# Patient Record
Sex: Male | Born: 1955 | Race: White | Hispanic: No | Marital: Married | Smoking: Never smoker
Health system: Southern US, Community
[De-identification: ages and names within clinical notes are randomized; demographics above are authoritative.]

## PROBLEM LIST (undated history)

## (undated) DIAGNOSIS — M1990 Unspecified osteoarthritis, unspecified site: Secondary | ICD-10-CM

## (undated) DIAGNOSIS — J984 Other disorders of lung: Secondary | ICD-10-CM

## (undated) DIAGNOSIS — L719 Rosacea, unspecified: Secondary | ICD-10-CM

## (undated) DIAGNOSIS — I483 Typical atrial flutter: Secondary | ICD-10-CM

## (undated) DIAGNOSIS — R002 Palpitations: Secondary | ICD-10-CM

## (undated) DIAGNOSIS — E782 Mixed hyperlipidemia: Secondary | ICD-10-CM

## (undated) DIAGNOSIS — R600 Localized edema: Secondary | ICD-10-CM

## (undated) DIAGNOSIS — K219 Gastro-esophageal reflux disease without esophagitis: Secondary | ICD-10-CM

## (undated) DIAGNOSIS — D6869 Other thrombophilia: Secondary | ICD-10-CM

## (undated) DIAGNOSIS — G4730 Sleep apnea, unspecified: Secondary | ICD-10-CM

## (undated) DIAGNOSIS — I4891 Unspecified atrial fibrillation: Secondary | ICD-10-CM

## (undated) DIAGNOSIS — R7303 Prediabetes: Secondary | ICD-10-CM

## (undated) DIAGNOSIS — I10 Essential (primary) hypertension: Secondary | ICD-10-CM

## (undated) DIAGNOSIS — I5022 Chronic systolic (congestive) heart failure: Secondary | ICD-10-CM

## (undated) DIAGNOSIS — Z9889 Other specified postprocedural states: Secondary | ICD-10-CM

## (undated) DIAGNOSIS — S83207A Unspecified tear of unspecified meniscus, current injury, left knee, initial encounter: Secondary | ICD-10-CM

## (undated) DIAGNOSIS — R0602 Shortness of breath: Secondary | ICD-10-CM

## (undated) DIAGNOSIS — T7840XA Allergy, unspecified, initial encounter: Secondary | ICD-10-CM

## (undated) DIAGNOSIS — M2550 Pain in unspecified joint: Secondary | ICD-10-CM

## (undated) DIAGNOSIS — D649 Anemia, unspecified: Secondary | ICD-10-CM

## (undated) DIAGNOSIS — R079 Chest pain, unspecified: Secondary | ICD-10-CM

## (undated) HISTORY — DX: Pain in unspecified joint: M25.50

## (undated) HISTORY — DX: Sleep apnea, unspecified: G47.30

## (undated) HISTORY — DX: Palpitations: R00.2

## (undated) HISTORY — PX: UPPER GASTROINTESTINAL ENDOSCOPY: SHX188

## (undated) HISTORY — DX: Chest pain, unspecified: R07.9

## (undated) HISTORY — DX: Rosacea, unspecified: L71.9

## (undated) HISTORY — DX: Unspecified atrial fibrillation: I48.91

## (undated) HISTORY — DX: Unspecified tear of unspecified meniscus, current injury, left knee, initial encounter: S83.207A

## (undated) HISTORY — DX: Anemia, unspecified: D64.9

## (undated) HISTORY — PX: COLONOSCOPY: SHX174

## (undated) HISTORY — DX: Essential (primary) hypertension: I10

## (undated) HISTORY — DX: Chronic systolic (congestive) heart failure: I50.22

## (undated) HISTORY — DX: Shortness of breath: R06.02

## (undated) HISTORY — DX: Unspecified osteoarthritis, unspecified site: M19.90

## (undated) HISTORY — DX: Prediabetes: R73.03

## (undated) HISTORY — DX: Typical atrial flutter: I48.3

## (undated) HISTORY — DX: Gastro-esophageal reflux disease without esophagitis: K21.9

## (undated) HISTORY — DX: Other thrombophilia: D68.69

## (undated) HISTORY — DX: Allergy, unspecified, initial encounter: T78.40XA

## (undated) HISTORY — DX: Localized edema: R60.0

## (undated) HISTORY — DX: Other specified postprocedural states: Z98.890

## (undated) HISTORY — DX: Mixed hyperlipidemia: E78.2

---

## 1999-02-22 ENCOUNTER — Ambulatory Visit: Admission: RE | Admit: 1999-02-22 | Discharge: 1999-02-22 | Payer: Self-pay | Admitting: Otolaryngology

## 1999-04-23 ENCOUNTER — Ambulatory Visit: Admission: RE | Admit: 1999-04-23 | Discharge: 1999-04-23 | Payer: Self-pay | Admitting: Otolaryngology

## 2002-06-20 ENCOUNTER — Encounter: Payer: Self-pay | Admitting: Gastroenterology

## 2002-06-20 ENCOUNTER — Ambulatory Visit (HOSPITAL_COMMUNITY): Admission: RE | Admit: 2002-06-20 | Discharge: 2002-06-20 | Payer: Self-pay | Admitting: Gastroenterology

## 2006-02-03 ENCOUNTER — Ambulatory Visit: Payer: Self-pay | Admitting: Family Medicine

## 2006-04-17 ENCOUNTER — Ambulatory Visit: Payer: Self-pay | Admitting: Family Medicine

## 2006-04-24 ENCOUNTER — Ambulatory Visit: Payer: Self-pay | Admitting: Family Medicine

## 2007-01-18 ENCOUNTER — Ambulatory Visit: Payer: Self-pay | Admitting: Family Medicine

## 2008-04-09 DIAGNOSIS — L03039 Cellulitis of unspecified toe: Secondary | ICD-10-CM

## 2008-04-09 DIAGNOSIS — L02619 Cutaneous abscess of unspecified foot: Secondary | ICD-10-CM | POA: Insufficient documentation

## 2008-04-11 ENCOUNTER — Ambulatory Visit: Payer: Self-pay | Admitting: Family Medicine

## 2008-04-14 ENCOUNTER — Ambulatory Visit: Payer: Self-pay | Admitting: Family Medicine

## 2008-07-22 ENCOUNTER — Telehealth: Payer: Self-pay | Admitting: Family Medicine

## 2008-12-12 DIAGNOSIS — J984 Other disorders of lung: Secondary | ICD-10-CM

## 2008-12-12 DIAGNOSIS — R0989 Other specified symptoms and signs involving the circulatory and respiratory systems: Secondary | ICD-10-CM

## 2008-12-12 DIAGNOSIS — R0609 Other forms of dyspnea: Secondary | ICD-10-CM

## 2008-12-12 HISTORY — DX: Other disorders of lung: J98.4

## 2009-01-02 ENCOUNTER — Ambulatory Visit: Payer: Self-pay | Admitting: Family Medicine

## 2009-01-02 ENCOUNTER — Telehealth: Payer: Self-pay | Admitting: Family Medicine

## 2009-01-08 ENCOUNTER — Ambulatory Visit: Payer: Self-pay | Admitting: Internal Medicine

## 2009-01-15 ENCOUNTER — Telehealth (INDEPENDENT_AMBULATORY_CARE_PROVIDER_SITE_OTHER): Payer: Self-pay | Admitting: *Deleted

## 2009-01-15 DIAGNOSIS — R0602 Shortness of breath: Secondary | ICD-10-CM

## 2009-01-29 ENCOUNTER — Ambulatory Visit: Payer: Self-pay | Admitting: Pulmonary Disease

## 2009-01-29 ENCOUNTER — Ambulatory Visit: Payer: Self-pay | Admitting: Internal Medicine

## 2009-01-29 DIAGNOSIS — R0902 Hypoxemia: Secondary | ICD-10-CM

## 2009-01-29 DIAGNOSIS — J309 Allergic rhinitis, unspecified: Secondary | ICD-10-CM | POA: Insufficient documentation

## 2009-01-29 DIAGNOSIS — G4730 Sleep apnea, unspecified: Secondary | ICD-10-CM

## 2009-01-30 ENCOUNTER — Telehealth: Payer: Self-pay | Admitting: Pulmonary Disease

## 2009-02-02 ENCOUNTER — Telehealth (INDEPENDENT_AMBULATORY_CARE_PROVIDER_SITE_OTHER): Payer: Self-pay | Admitting: *Deleted

## 2009-02-02 ENCOUNTER — Ambulatory Visit: Payer: Self-pay | Admitting: Pulmonary Disease

## 2009-02-02 LAB — CONVERTED CEMR LAB
Eosinophils Relative: 4.1 % (ref 0.0–5.0)
HCT: 51.4 % (ref 39.0–52.0)
INR: 1.1 — ABNORMAL HIGH (ref 0.8–1.0)
Lymphocytes Relative: 20.8 % (ref 12.0–46.0)
Monocytes Relative: 10.7 % (ref 3.0–12.0)
Neutrophils Relative %: 64.3 % (ref 43.0–77.0)
Platelets: 292 10*3/uL (ref 150–400)
WBC: 8.1 10*3/uL (ref 4.5–10.5)

## 2009-02-04 ENCOUNTER — Ambulatory Visit: Payer: Self-pay | Admitting: Emergency Medicine

## 2009-02-04 ENCOUNTER — Ambulatory Visit: Admission: RE | Admit: 2009-02-04 | Discharge: 2009-02-04 | Payer: Self-pay | Admitting: Emergency Medicine

## 2009-02-04 ENCOUNTER — Encounter: Payer: Self-pay | Admitting: Emergency Medicine

## 2009-02-06 ENCOUNTER — Telehealth: Payer: Self-pay | Admitting: Internal Medicine

## 2009-02-09 ENCOUNTER — Inpatient Hospital Stay (HOSPITAL_COMMUNITY): Admission: EM | Admit: 2009-02-09 | Discharge: 2009-02-16 | Payer: Self-pay | Admitting: Emergency Medicine

## 2009-02-09 ENCOUNTER — Ambulatory Visit: Payer: Self-pay | Admitting: Pulmonary Disease

## 2009-02-09 ENCOUNTER — Ambulatory Visit: Payer: Self-pay | Admitting: Thoracic Surgery

## 2009-02-10 ENCOUNTER — Encounter: Payer: Self-pay | Admitting: Pulmonary Disease

## 2009-02-10 ENCOUNTER — Encounter: Payer: Self-pay | Admitting: Thoracic Surgery

## 2009-02-24 ENCOUNTER — Ambulatory Visit: Payer: Self-pay | Admitting: Thoracic Surgery

## 2009-02-24 ENCOUNTER — Encounter: Admission: RE | Admit: 2009-02-24 | Discharge: 2009-02-24 | Payer: Self-pay | Admitting: Thoracic Surgery

## 2009-02-25 ENCOUNTER — Telehealth (INDEPENDENT_AMBULATORY_CARE_PROVIDER_SITE_OTHER): Payer: Self-pay | Admitting: *Deleted

## 2009-03-02 ENCOUNTER — Ambulatory Visit: Payer: Self-pay | Admitting: Pulmonary Disease

## 2009-03-02 DIAGNOSIS — J679 Hypersensitivity pneumonitis due to unspecified organic dust: Secondary | ICD-10-CM | POA: Insufficient documentation

## 2009-03-12 ENCOUNTER — Encounter: Payer: Self-pay | Admitting: Pulmonary Disease

## 2009-03-12 ENCOUNTER — Telehealth (INDEPENDENT_AMBULATORY_CARE_PROVIDER_SITE_OTHER): Payer: Self-pay | Admitting: *Deleted

## 2009-03-17 ENCOUNTER — Ambulatory Visit: Payer: Self-pay | Admitting: Thoracic Surgery

## 2009-03-17 ENCOUNTER — Encounter: Admission: RE | Admit: 2009-03-17 | Discharge: 2009-03-17 | Payer: Self-pay | Admitting: Thoracic Surgery

## 2009-03-30 ENCOUNTER — Ambulatory Visit: Payer: Self-pay | Admitting: Pulmonary Disease

## 2009-04-11 HISTORY — PX: LUNG BIOPSY: SHX232

## 2009-04-27 ENCOUNTER — Ambulatory Visit: Payer: Self-pay | Admitting: Pulmonary Disease

## 2009-05-20 ENCOUNTER — Encounter: Admission: RE | Admit: 2009-05-20 | Discharge: 2009-05-20 | Payer: Self-pay | Admitting: Thoracic Surgery

## 2009-05-20 ENCOUNTER — Ambulatory Visit: Payer: Self-pay | Admitting: Thoracic Surgery

## 2009-06-09 ENCOUNTER — Encounter (INDEPENDENT_AMBULATORY_CARE_PROVIDER_SITE_OTHER): Payer: Self-pay | Admitting: *Deleted

## 2009-07-08 ENCOUNTER — Ambulatory Visit: Payer: Self-pay | Admitting: Pulmonary Disease

## 2009-07-08 ENCOUNTER — Encounter: Payer: Self-pay | Admitting: Pulmonary Disease

## 2009-09-04 ENCOUNTER — Ambulatory Visit: Payer: Self-pay | Admitting: Pulmonary Disease

## 2009-09-10 ENCOUNTER — Telehealth (INDEPENDENT_AMBULATORY_CARE_PROVIDER_SITE_OTHER): Payer: Self-pay | Admitting: *Deleted

## 2009-09-10 ENCOUNTER — Ambulatory Visit: Payer: Self-pay | Admitting: Pulmonary Disease

## 2009-09-10 LAB — CONVERTED CEMR LAB: IgE (Immunoglobulin E), Serum: <1.5 intl units/mL (ref 0.0–180.0)

## 2009-09-15 ENCOUNTER — Telehealth: Payer: Self-pay | Admitting: Pulmonary Disease

## 2009-09-24 ENCOUNTER — Ambulatory Visit: Payer: Self-pay | Admitting: Pulmonary Disease

## 2009-10-23 ENCOUNTER — Ambulatory Visit: Payer: Self-pay | Admitting: Internal Medicine

## 2009-11-09 ENCOUNTER — Ambulatory Visit: Payer: Self-pay | Admitting: Pulmonary Disease

## 2009-12-15 ENCOUNTER — Telehealth: Payer: Self-pay | Admitting: Pulmonary Disease

## 2010-01-08 ENCOUNTER — Ambulatory Visit: Payer: Self-pay | Admitting: Pulmonary Disease

## 2010-03-14 IMAGING — CR DG CHEST 1V PORT
1 series · 1 of 1 positions shown · non-contrast
Comparison: Chest CT dated 01/29/2009 and chest x-ray dated
01/02/2009

CLINICAL DATA: Infiltrates bilaterally on chest CT.  Status post
bronchoscopy.

PORTABLE CHEST - 1 VIEW

[view not recorded]
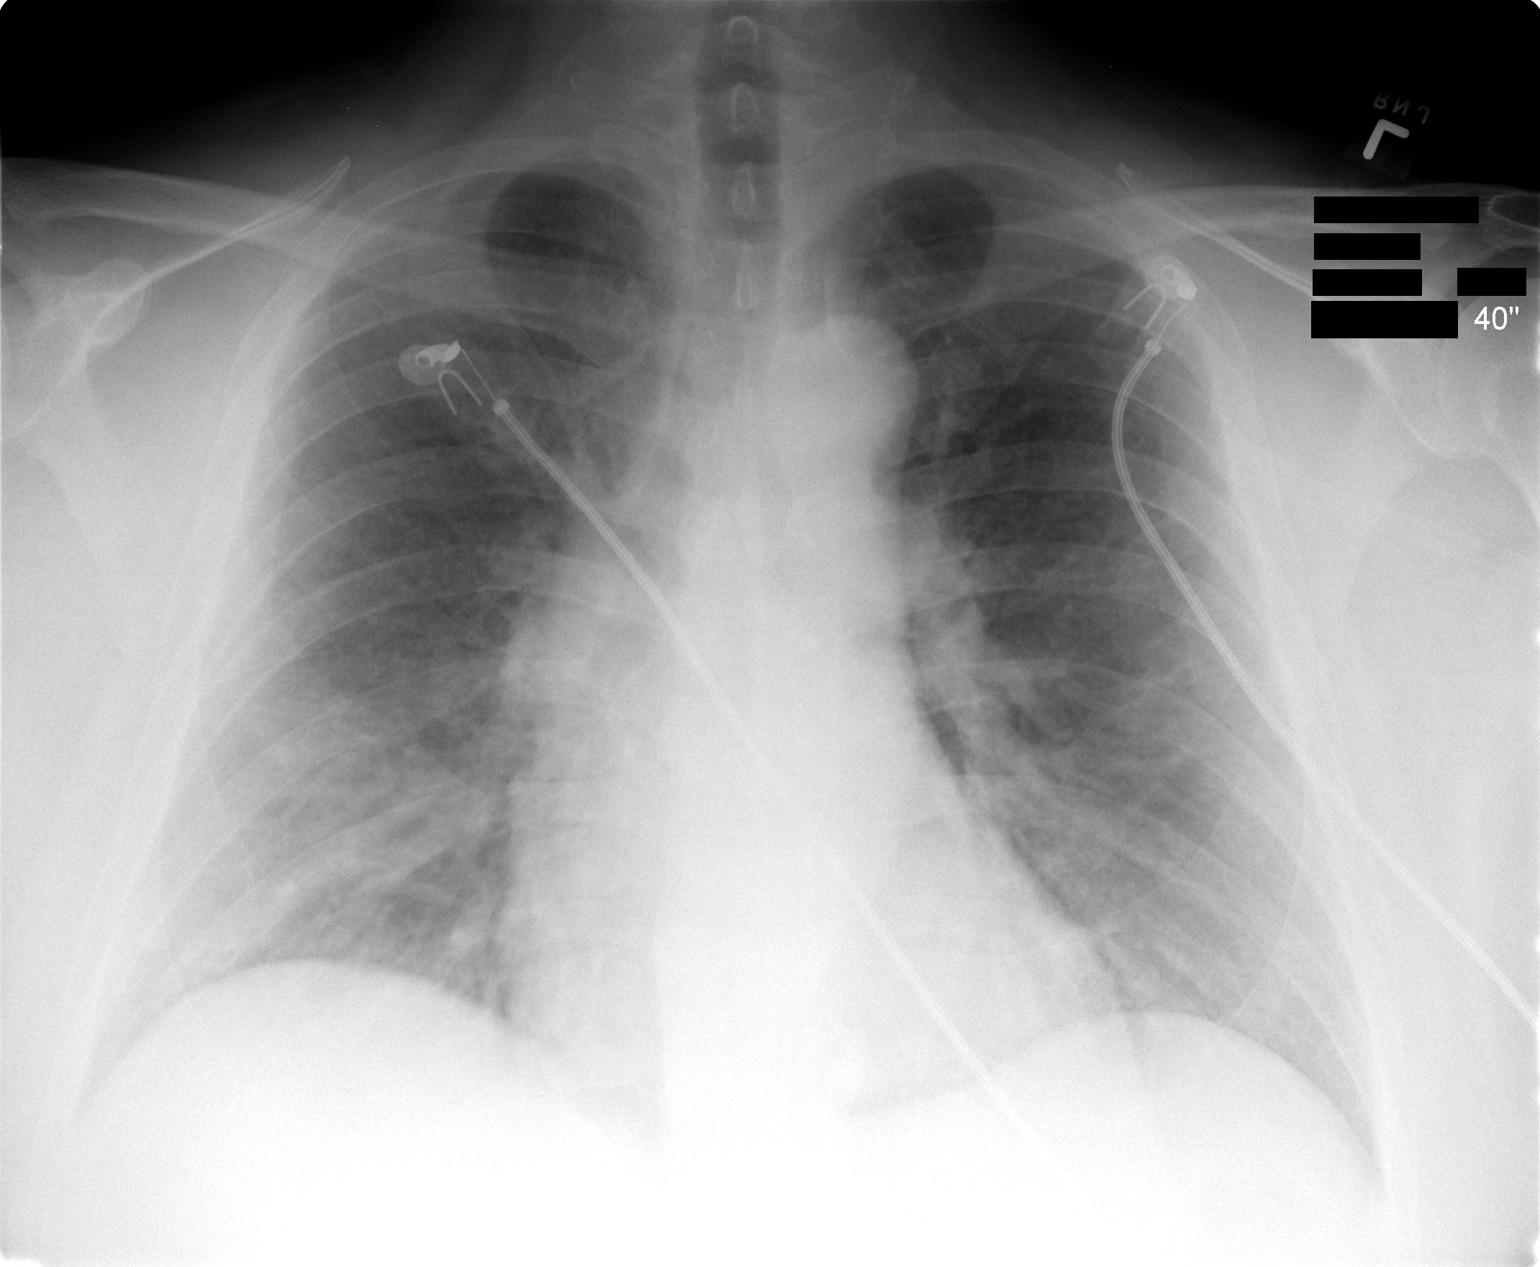

[1 of 1 positions shown; findings below may reference images not displayed]

FINDINGS: There is no pneumothorax.  There is minimal haziness in
the right lower lung zone which probably represents a small
residual area of infiltrate.  The left lung is clear.  Heart and
mediastinal structures are normal.
IMPRESSION: Small area of density at the right lung base which probably
represents a small residual area of infiltrate.  No pneumothorax.

## 2010-03-18 IMAGING — CR DG CHEST 2V
2 series · 2 of 2 positions shown · non-contrast
Comparison: 02/04/2009

CLINICAL DATA: Fever and congestion.

CHEST - 2 VIEW

[w chest pa]
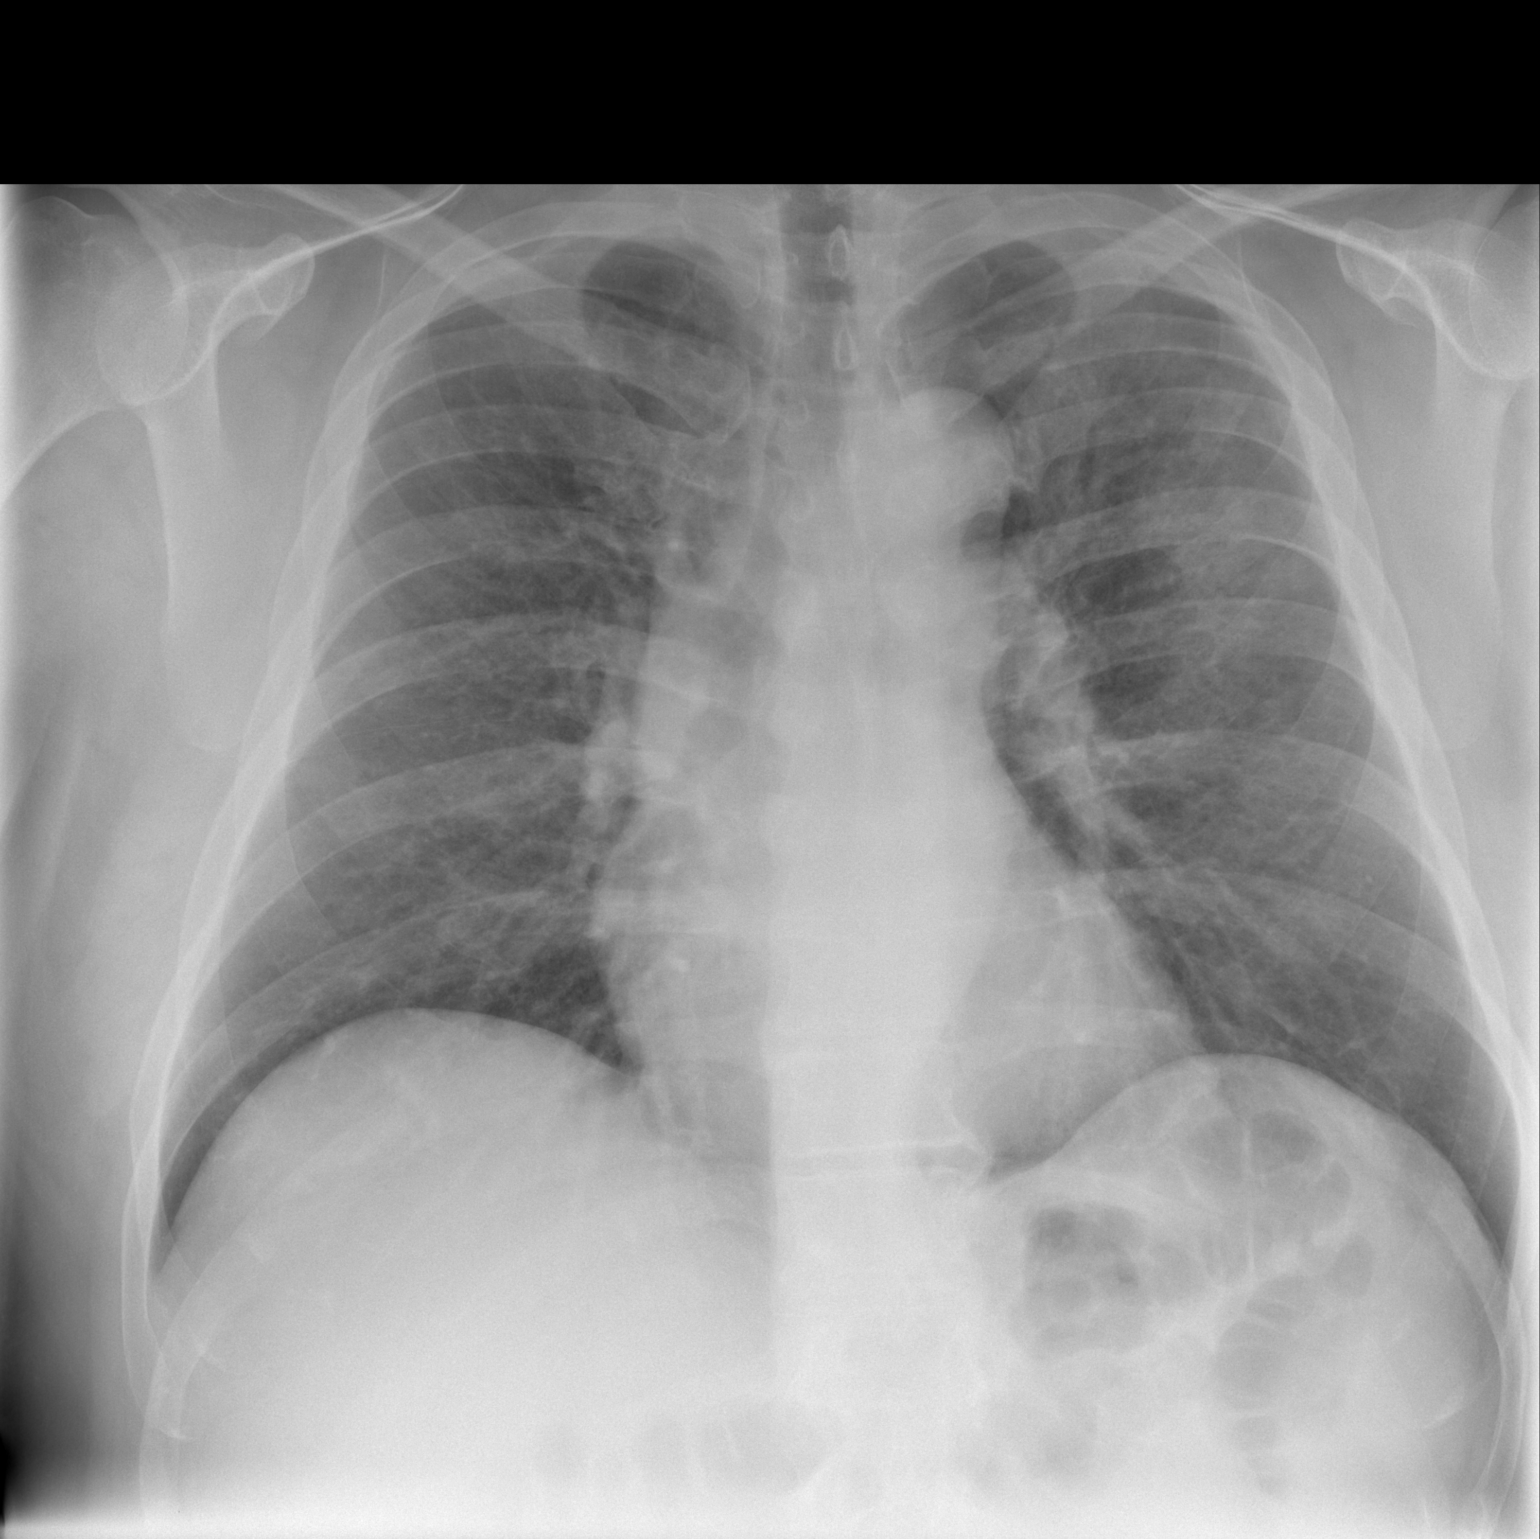

[w chest lat]
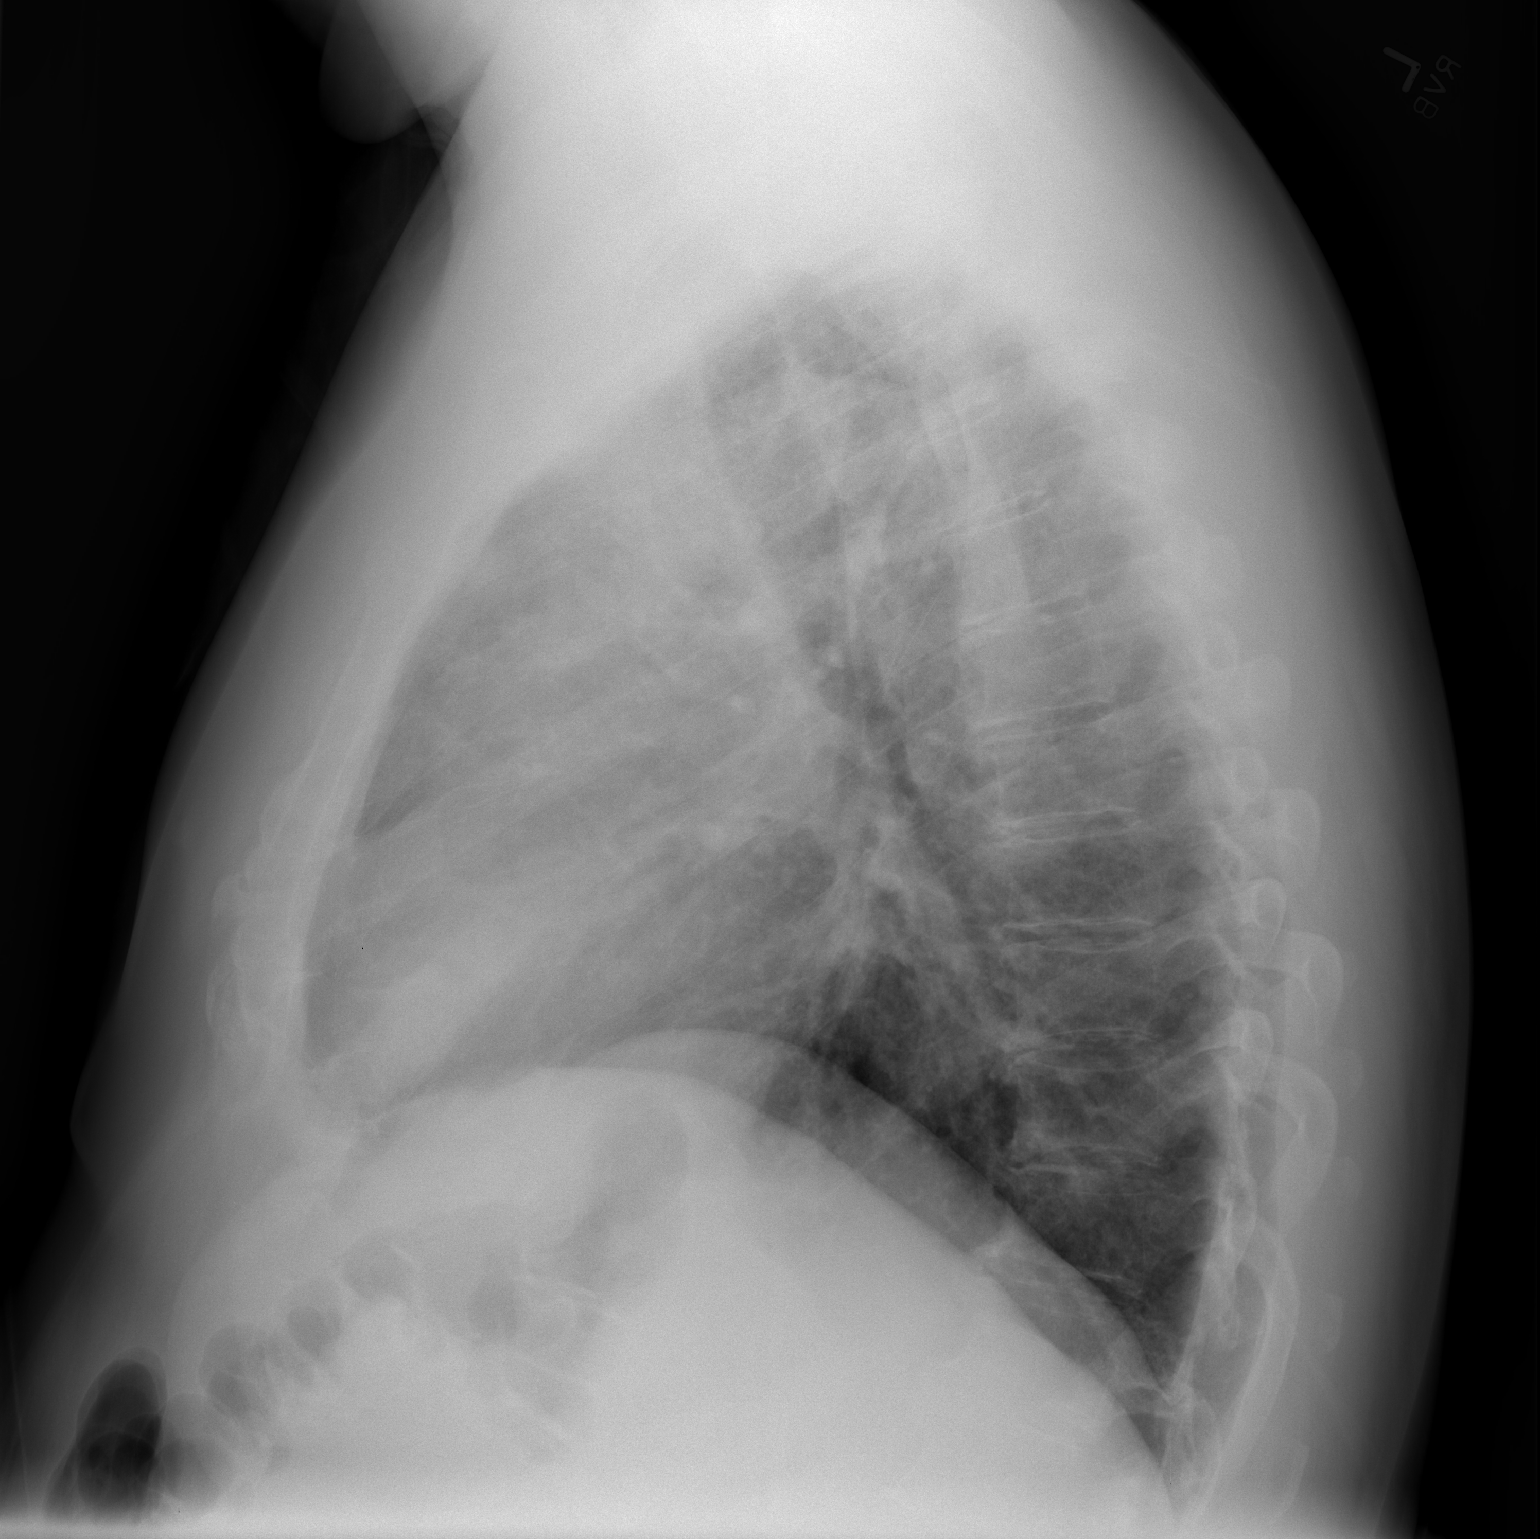

[2 of 2 positions shown; findings below may reference images not displayed]

FINDINGS: The cardiac silhouette, mediastinal and hilar contours
are stable.  Progressive interstitial and airspace process may
reflect edema or infection.  No focal airspace consolidation or
pleural effusion.
IMPRESSION: 1.  Progressive interstitial and airspace process.  This may
reflect an acute inflammatory or infectious alveolitis or edema.

## 2010-03-20 IMAGING — CR DG CHEST 1V PORT
1 series · 1 of 1 positions shown · non-contrast
Comparison: Plain film 02/08/2009.

CLINICAL DATA: Hypoxia.  Interstitial lung disease.

PORTABLE CHEST - 1 VIEW

[view not recorded]
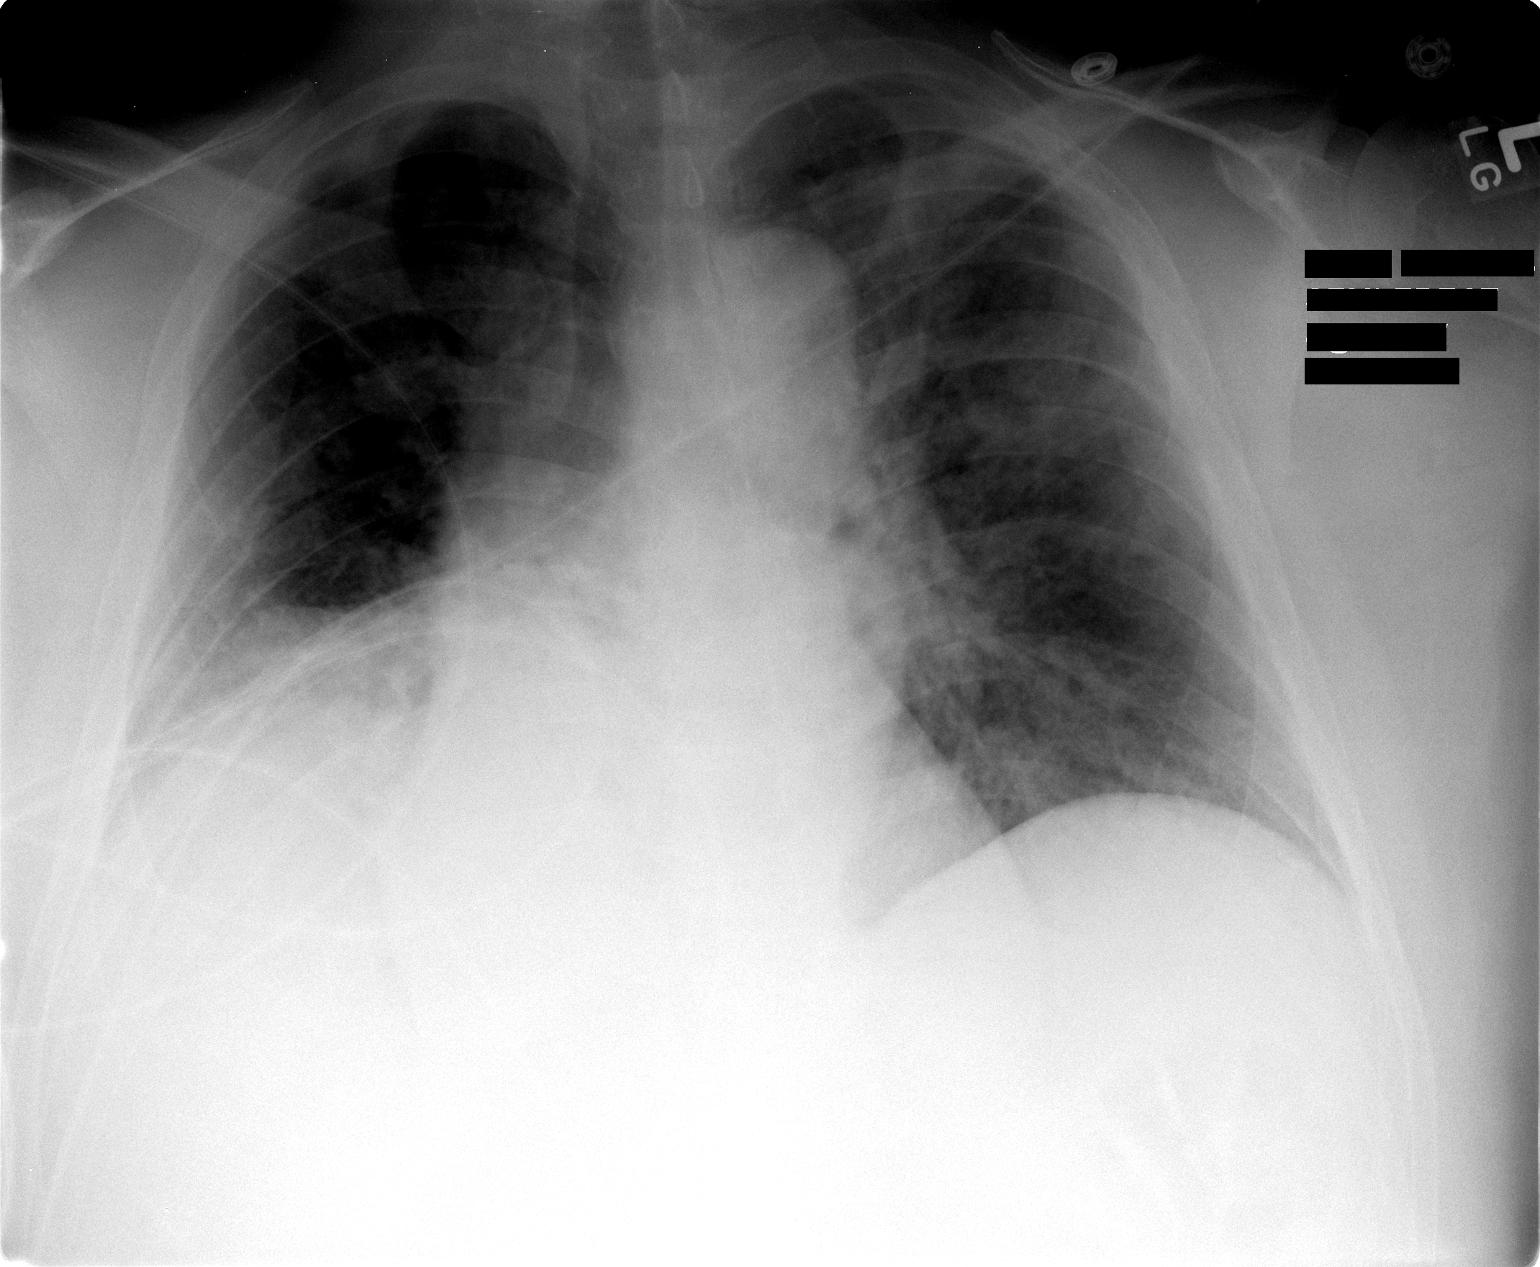

[1 of 1 positions shown; findings below may reference images not displayed]

FINDINGS: There has been marked worsening in aeration, particularly
the right lung base where there is now dense airspace opacity and
likely right effusion.  There has also been progression of airspace
disease throughout the left chest.  Lung volumes are lower than on
the prior study.  Heart size normal.
IMPRESSION: Marked worsening in bilateral airspace disease particularly the
right lung base.  Findings are worrisome for progressive pneumonia
although edema with atelectasis and effusion in the right base
could create similar appearance.

## 2010-03-21 IMAGING — CR DG CHEST 1V PORT
1 series · 1 of 1 positions shown · non-contrast
Comparison: 02/10/2009

CLINICAL DATA: Lung lesion removal.

PORTABLE CHEST - 1 VIEW

[AP]
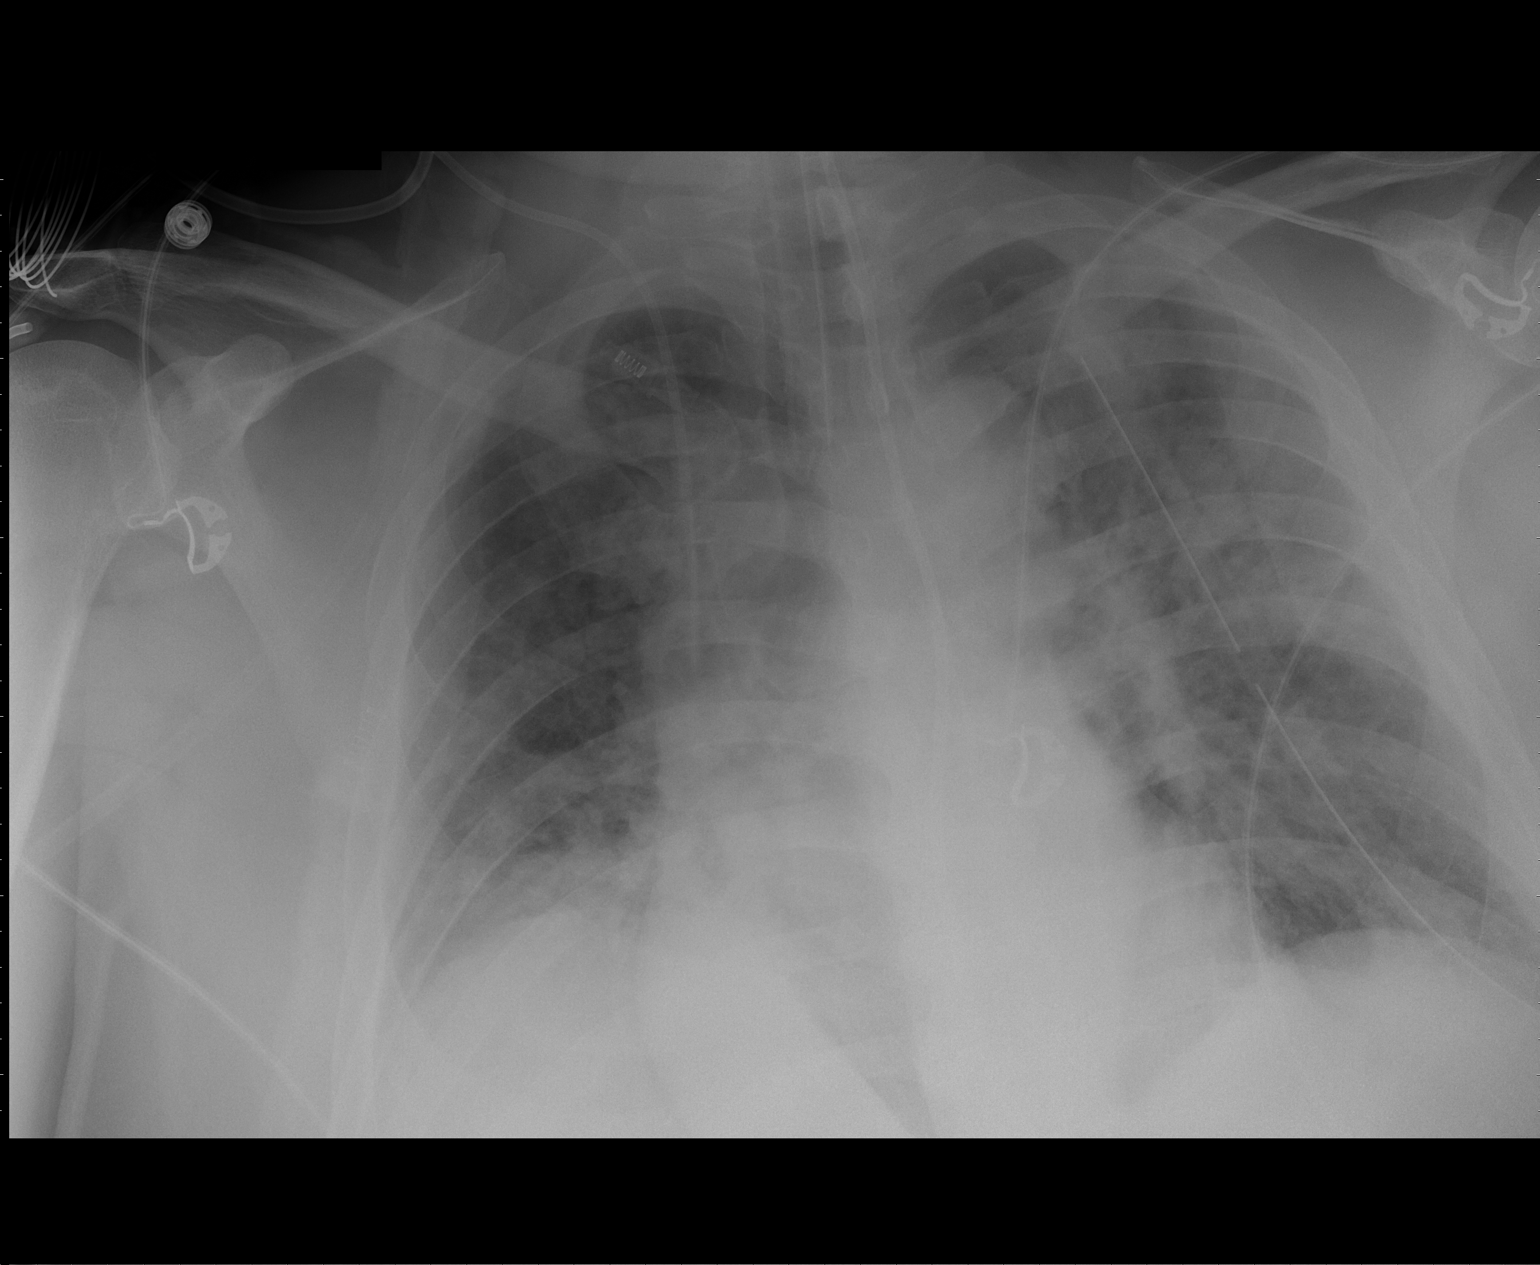

[1 of 1 positions shown; findings below may reference images not displayed]

FINDINGS: The support apparatus is stable.  There has been and
interval placement of a feeding tube.  No pneumothorax is seen.
Persistent airspace process may be a combination of edema and
atelectasis. No definite pleural effusions.
IMPRESSION: 1.  Stable support apparatus. Interval feeding tube placement.
2.  Persistent bilateral airspace process.

## 2010-04-08 ENCOUNTER — Ambulatory Visit: Payer: Self-pay | Admitting: Pulmonary Disease

## 2010-06-28 ENCOUNTER — Ambulatory Visit: Payer: Self-pay | Admitting: Family Medicine

## 2010-06-28 DIAGNOSIS — J029 Acute pharyngitis, unspecified: Secondary | ICD-10-CM | POA: Insufficient documentation

## 2010-07-07 ENCOUNTER — Ambulatory Visit: Payer: Self-pay | Admitting: Pulmonary Disease

## 2010-07-07 ENCOUNTER — Encounter: Payer: Self-pay | Admitting: Pulmonary Disease

## 2010-07-09 ENCOUNTER — Telehealth (INDEPENDENT_AMBULATORY_CARE_PROVIDER_SITE_OTHER): Payer: Self-pay | Admitting: *Deleted

## 2010-07-09 ENCOUNTER — Ambulatory Visit: Payer: Self-pay | Admitting: Cardiology

## 2010-07-26 ENCOUNTER — Telehealth (INDEPENDENT_AMBULATORY_CARE_PROVIDER_SITE_OTHER): Payer: Self-pay | Admitting: *Deleted

## 2010-08-09 ENCOUNTER — Telehealth: Payer: Self-pay | Admitting: Pulmonary Disease

## 2010-08-25 ENCOUNTER — Ambulatory Visit: Payer: Self-pay | Admitting: Pulmonary Disease

## 2010-08-31 ENCOUNTER — Telehealth (INDEPENDENT_AMBULATORY_CARE_PROVIDER_SITE_OTHER): Payer: Self-pay | Admitting: *Deleted

## 2010-10-26 ENCOUNTER — Ambulatory Visit: Payer: Self-pay | Admitting: Pulmonary Disease

## 2010-12-21 ENCOUNTER — Telehealth (INDEPENDENT_AMBULATORY_CARE_PROVIDER_SITE_OTHER): Payer: Self-pay | Admitting: *Deleted

## 2011-01-02 ENCOUNTER — Encounter: Payer: Self-pay | Admitting: Pulmonary Disease

## 2011-01-09 LAB — CONVERTED CEMR LAB
GFR calc Af Amer: 115 mL/min
Glucose, Bld: 101 mg/dL — ABNORMAL HIGH (ref 70–99)
Potassium: 4.2 meq/L (ref 3.5–5.1)
Sodium: 138 meq/L (ref 135–145)

## 2011-01-11 NOTE — Assessment & Plan Note (Signed)
Summary: Douglas Chandler for HP   Primary Provider/Referring Provider:  Dr Amador Cunas  CC:  Pt is here for a 2 month f/u appt.   Pt states breathing is unchanged from last visit.  Pt denied a cough.  Pt is currently on 10mg  of Prednisone.  Pt denied any new complaints. .  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  He is being maintained on chronic prednisone at 10mg /day, and this seems to be adequately controlling his symptoms.  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 We have found no other offending agent on environmental survey of home.  He denies any cough, congestion, or mucus.  He denies any signficant doe.  Current Medications (verified): 1)  Prednisone 10 Mg  Tabs (Prednisone) .... Take 1 Tablet By Mouth Once A Day  Allergies (verified): No Known Drug Allergies  Review of Systems      See HPI  Vital Signs:  Patient profile:   55 year old male Height:      75.5 inches Weight:      278.38 pounds BMI:     34.46 O2 Sat:      91 % on Room air Temp:     98.1 degrees F oral Pulse rate:   87 / minute BP sitting:   136 / 74  (left arm) Cuff size:   regular  Vitals Entered By: Arman Filter LPN (January 08, 2010 2:45 PM)  O2 Flow:  Room air CC: Pt is here for a 2 month f/u appt.   Pt states breathing is unchanged from last visit.  Pt denied a cough.  Pt is currently on 10mg  of Prednisone.  Pt denied any new complaints.  Comments Medications reviewed with patient Arman Filter LPN  January 08, 2010 2:45 PM    Physical Exam  General:  ow male in nad Lungs:  clear to auscultation Heart:  rrr, no mrg Extremities:  no edema or cyanosis Neurologic:  alert and oriented, moves all 4.   Impression & Recommendations:  Problem # 1:  UNSPECIFIED ALLERGIC ALVEOLITIS AND PNEUMONITIS (ICD-495.9)  the pt has chronic HP which  is currently being well controlled with low dose prednisone.  I suspect the offending agent is coming from his work environment, and I discussed with him the possibility of getting out of that area for a period of time to see if things improve significantly.  He will discuss with his employer.  I also asked him to consider changing jobs, but he feels this would be difficult in these tough economic times.  Will maintain on prednisone for now, but I am concerned that he will not be able to come off until he is out of his current work environment.  Medications Added to Medication List This Visit: 1)  Prednisone 10 Mg Tabs (Prednisone) .... Take 1 tablet by mouth once a day  Other Orders: Est. Patient Level III (16109)  Patient Instructions: 1)  stay on current dose of prednisone 2)  think about LOA from your particular job, and let me know if I can help. 3)  followup with me in 3mos or sooner if having issues. Prescriptions: PREDNISONE 10 MG  TABS (PREDNISONE) Take 1 tablet by mouth once a day  #30 x 6   Entered and Authorized by:   Barbaraann Share MD  Signed by:   Barbaraann Share MD on 01/08/2010   Method used:   Print then Give to Patient   RxID:   [id number]    Immunization History:  Influenza Immunization History:    Influenza:  historical (02/16/2009)  Pneumovax Immunization History:    Pneumovax:  historical (02/16/2009)

## 2011-01-11 NOTE — Progress Notes (Signed)
Summary: fyi  Phone Note Call from Patient Call back at [phone]   Caller: Patient Call For: Trung Wenzl Summary of Call: pt calling to give name of metal at his job per dr Shanetha Bradham:( tungsten ) Initial call taken by: Rickard Patience,  December 15, 2009 10:00 AM  Follow-up for Phone Call        Just an FYI.  Pt last saw [facility] 11-09-2009.  Per pt instructions:......  3)  please check and see if you are exposed to beryllium, cobalt,  or tungsten in your work environment.   Pt calling to inform pt that he works with [employer] at his job.   Aundra Millet Reynolds LPN  December 15, 2009 10:10 AM   Additional Follow-up for Phone Call Additional follow up Details #1::        noted Additional Follow-up by: Barbaraann Share MD,  December 28, 2009 6:16 AM

## 2011-01-11 NOTE — Assessment & Plan Note (Signed)
Summary: Douglas Chandler for HP   Primary Provider/Referring Provider:  Dr Amador Cunas  CC:  Pt is here for a 3 month f/u appt. Pt states he is taking 10mg  of Prednisone.  Pt states breathing is unchanged from last visit.  pt denied cough and sore throat or chest tightness/pain.  Pt denied any new concerns.  .  History of Present Illness: The pt comes in today for f/u of his HP, possibly due to metal working fluids or metals themselves in his work environment.  He is doing well currently on prednisone 10mg /day, and denies any worsening sob.  He has no cough or mucus.  He has not really addressed changing jobs due to the difficult economic times currently.    Medications Prior to Update: 1)  Prednisone 10 Mg  Tabs (Prednisone) .... Take 1 Tablet By Mouth Once A Day  Allergies (verified): No Known Drug Allergies  Review of Systems      See HPI  Vital Signs:  Patient profile:   55 year old male Height:      75.5 inches Weight:      287.38 pounds BMI:     35.57 O2 Sat:      91 % on Room air Temp:     98.0 degrees F oral Pulse rate:   95 / minute BP sitting:   136 / 90  (left arm) Cuff size:   large  Vitals Entered By: Arman Filter LPN (April 08, 2010 3:03 PM)  O2 Flow:  Room air CC: Pt is here for a 3 month f/u appt. Pt states he is taking 10mg  of Prednisone.  Pt states breathing is unchanged from last visit.  pt denied cough, sore throat or chest tightness/pain.  Pt denied any new concerns.   Comments Medications reviewed with patient Arman Filter LPN  April 08, 2010 3:03 PM    Physical Exam  General:  ow male in nad Lungs:  minimal basilar crackles, no wheezing or rhonchi Heart:  rrr, no mrg Extremities:  no edema or cyanosis Neurologic:  alert and oriented, moves all 4.   Impression & Recommendations:  Problem # 1:  UNSPECIFIED ALLERGIC ALVEOLITIS AND PNEUMONITIS (ICD-495.9) the pt is doing well currently from his HP, but is on prednisone at 10mg /day.  The last time we  weaned off prednisone, he had a flare of his disease.  I am concerned the etiology is his work environment, and that his lung disease may be progressive if he doesn't leave.  But, at the same time I understand his need to make a living.  The pt understands the risks involved.  Will try again to slowly taper off prednisone over the next 2mos, and see how he does.  I also stressed to him the need to work on weight loss and conditioning.  Medications Added to Medication List This Visit: 1)  Prednisone 5 Mg Tabs (Prednisone) .... As directed.  Other Orders: Est. Patient Level III (31517)  Patient Instructions: 1)  will decrease prednisone to 7.5 mg each day for 14 days, then 5mg  for 30 days.  If remain stable, can decrease to 2.5 mg each day for 14 days, then discontinue if doing well. 2)  please call if worsening symptoms at any time 3)  work on weight loss 4)  followup with me in 3mos.   Prescriptions: PREDNISONE 5 MG TABS (PREDNISONE) as directed.  #50 x 6   Entered and Authorized by:   Barbaraann Share MD   Signed by:  Barbaraann Share MD on 04/08/2010   Method used:   Print then Give to Patient   RxID:   [id number]

## 2011-01-11 NOTE — Progress Notes (Signed)
  Phone Note Other Incoming   Request: Send information Summary of Call: Request for records received from [employer]. Request forwarded to [facility].     

## 2011-01-11 NOTE — Assessment & Plan Note (Signed)
Summary: rov for HP   Primary Provider/Referring Provider:  Dr Amador Cunas  CC:  2 month follow up.  Pt states brathing is doing "great."  Denies SOB, wheezing, chest tightness, and cough. No complaints. .  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  He has been out of his work environment and off prednisone, and has been doing very well.  His exertional tolerance has returned to his usual baseline, and he denies any cough or mucus production.  His sats have been excellent, even with exertional activities.  Current Medications (verified): 1)  Flonase 50 Mcg/act Susp (Fluticasone Propionate) .... Uad  Allergies (verified): No Known Drug Allergies  Review of Systems       The patient complains of indigestion, headaches, and nasal congestion/difficulty breathing through nose.  The patient denies shortness of breath with activity, shortness of breath at rest, productive cough, non-productive cough, coughing up blood, chest pain, irregular heartbeats, loss of appetite, weight change, abdominal pain, difficulty swallowing, sore throat, tooth/dental problems, sneezing, itching, ear ache, anxiety, depression, hand/feet swelling, joint stiffness or pain, rash, change in color of mucus, and fever.    Vital Signs:  Patient profile:   55 year old male Height:      76 inches Weight:      285.25 pounds BMI:     34.85 O2 Sat:      96 % on Room air Temp:     98.4 degrees F oral Pulse rate:   76 / minute BP sitting:   126 / 94  (left arm) Cuff size:   large  Vitals Entered By: Gweneth Dimitri RN (October 26, 2010 9:36 AM)  O2 Flow:  Room air CC: 2 month follow up.  Pt states brathing is doing "great."  Denies SOB, wheezing, chest tightness, cough. No complaints.  Comments Medications reviewed with patient Daytime contact number verified with patient. Gweneth Dimitri RN  October 26, 2010 9:38 AM    Physical Exam  General:  4  male in nad Lungs:  totally clear to auscultation Heart:  rrr, no mrg Extremities:  no edema or cyanosis  Neurologic:  alert and oriented, moves all 4.   Impression & Recommendations:  Problem # 1:  UNSPECIFIED ALLERGIC ALVEOLITIS AND PNEUMONITIS (ICD-495.9)  the pt is returning to his normal baseline after being out of his previous work environment.  He is staying active, and is not experiencing any sob or cough.  His lung exam is clear today, and his sats are excellent.  I have asked him to work on weight reduction and some type of exercise program, and would like to see him back in 6mos for one final check and cxr.    Other Orders: Est. Patient Level III (73419)  Patient Instructions: 1)  will see you back in 6mos with cxr same day before the visit 2)  please call if changes in your breathing before the visit.

## 2011-01-11 NOTE — Assessment & Plan Note (Signed)
Summary: rov for HP   Primary Provider/Referring Provider:  Dr Amador Cunas  CC:  Pt is here for a 3 month f/u appt.  Pt is currently on 2.5 mg of Prednisone. Pt c/o non-productive cough. .  History of Present Illness: the pt comes in today for f/u of his known HP, that is felt possibly due to his work environment.  We have been tapering his prednisone dose (he had a re-flare off prednisone times one already), and has done fairly well with this.  He has had a little more cough and upper chest congestion the past 2 weeks, but thinks it may be due to allergy and postnasal drip.  He has not seen a big change in his exertional tolerance level, and does not feel he is more sob.  Current Medications (verified): 1)  Prednisone 5 Mg Tabs (Prednisone) .... 1/2 Tab By Mouth Daily 2)  Flonase 50 Mcg/act Susp (Fluticasone Propionate) .... Uad  Allergies (verified): No Known Drug Allergies  Review of Systems       The patient complains of non-productive cough.  The patient denies shortness of breath with activity, shortness of breath at rest, productive cough, coughing up blood, chest pain, irregular heartbeats, acid heartburn, indigestion, loss of appetite, weight change, abdominal pain, difficulty swallowing, sore throat, tooth/dental problems, headaches, nasal congestion/difficulty breathing through nose, sneezing, itching, ear ache, anxiety, depression, hand/feet swelling, joint stiffness or pain, rash, change in color of mucus, and fever.    Vital Signs:  Patient profile:   55 year old male Height:      75.5 inches Weight:      277 pounds BMI:     34.29 O2 Sat:      83 % on Room air Temp:     97.8 degrees F oral BP sitting:   118 / 72  (left arm) Cuff size:   large  Vitals Entered By: Arman Filter LPN (July 07, 2010 3:13 PM)  O2 Flow:  Room air  Serial Vital Signs/Assessments:  Comments: 4:15 PM Ambulatory Pulse Oximetry  Resting; HR__93___    02 Sat__91% on room air___  Lap1  (185 feet)   HR__102___   02 Sat__89% on room air___ Lap2 (185 feet)   HR__102___   02 Sat_92% on room air____    Lap3 (185 feet)   HR__104___   02 Sat__86% on room air___  _x__Test Completed without Difficulty Patients sats dropped to 86% on room air after walking 3 laps but was not in any distress and did not                                                               seem short of breath. ___Test Stopped due to:  By: Michel Bickers CMA   CC: Pt is here for a 3 month f/u appt.  Pt is currently on 2.5 mg of Prednisone. Pt c/o non-productive cough.  Comments rechecked pt's o2 sats after pt rested for a few minutes and took some deep breaths.  o2 sats increased to 93% on RA Medications reviewed with patient Arman Filter LPN  July 07, 2010 3:14 PM    Physical Exam  General:  ow male in nad Nose:  no purulence or drainage noted. Lungs:  minimal bibasilar crackles, no wheezing or rhonchi Heart:  rrr, no mrg Extremities:  no edema or cyanosis  Neurologic:  alert and oriented, moves all 4.   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  He has had an environmental survey of his home, with nothing being found.  He responds well to prednisone, but has flared in the past on lower doses.  He does not have a lot of pulmonary symptoms currently, but I am concerned about his sats today.  This has been a clue to his flareups in the past.  Will check cxr today to see if worsening, and if so, will need to boost his prednisone dose.  I have had a very candid conversation with the pt about his working environment, and asked him to discuss with his employer possibly getting out of his current environment for a period of time to see if it will make a difference.  He may also have to consider leaving his current job.  Will call the pt after cxr  results are available.  Medications Added to Medication List This Visit: 1)  Prednisone 5 Mg Tabs (Prednisone) .... 1/2 tab by mouth daily  Other Orders: Est. Patient Level III (99213) Pulse Oximetry, Ambulatory (14782) T-2 View CXR (71020TC)  Patient Instructions: 1)  will check cxr today 2)  continue current prednisone dose until you hear from me about cxr 3)  think about other options for your work.

## 2011-01-11 NOTE — Progress Notes (Signed)
Summary: PREDNISONE  Phone Note Call from Patient Call back at Home Phone [phone]   Caller: Patient Call For: [facility] Summary of Call: PT WAS SEEN 7/27. HE NEEDS PREDNISONE CALLED IN TO [facility]. SAYS HE EXPLAINED WHEN HE WAS SEEN THAT HE DIDN'T HAVE ANY PREDNISONE.  Initial call taken by: Tivis Ringer, CNA,  July 09, 2010 12:48 PM  Follow-up for Phone Call        [facility], is it okay to refill prednisone? Pls advise, thanks! Vernie Murders  July 09, 2010 2:56 PM   Additional Follow-up for Phone Call Additional follow up Details #1::        please send in to drugstore. prednisone 10mg  2 each day for one week, then 1 1/2 each day for one week, then one each day for one week. then call me with how your breathing is doing. please let pt know to call me..I forgot to mention on his machine. #50, one fill Additional Follow-up by: Barbaraann Share MD,  July 09, 2010 5:26 PM    Additional Follow-up for Phone Call Additional follow up Details #2::    rx sent.  Aundra Millet Reynolds LPN  July 09, 2010 5:30 PM   New/Updated Medications: PREDNISONE 10 MG TABS (PREDNISONE) take 2 tabs each day x 1 week, then 1 1/2 tabs each day x 1 week, then 1 tab each day x 1 week, then call Dr. Shelle Iron with update Prescriptions: PREDNISONE 10 MG TABS (PREDNISONE) take 2 tabs each day x 1 week, then 1 1/2 tabs each day x 1 week, then 1 tab each day x 1 week, then call Dr. Shelle Iron with update  #50 x 1   Entered by:   Arman Filter LPN   Authorized by:   Barbaraann Share MD   Signed by:   Arman Filter LPN on 73/22/0254   Method used:   Electronically to        [facility]* (retail)       [address] [place], [place]  [address]       Ph: [phone] or [phone]       Fax: [phone]   RxID:   [id number]
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
## 2011-01-11 NOTE — Miscellaneous (Signed)
Summary: Orders Update   Clinical Lists Changes  Orders: Added new Referral order of Radiology Referral (Radiology) - Signed 

## 2011-01-11 NOTE — Progress Notes (Signed)
Summary: FYI---ok to mail letter  Phone Note Call from Patient Call back at [facility] Phone [phone]   Caller: Patient Call For: clance Summary of Call: FYI: Pt states it will be ok to mail letter that [facility] [employer] dictated. Initial call taken by: Darletta Moll,  August 31, 2010 8:59 AM  Follow-up for Phone Call        noted.will mail letter.  Aundra Millet Reynolds LPN  August 31, 2010 9:28 AM

## 2011-01-11 NOTE — Assessment & Plan Note (Signed)
Summary: Douglas Chandler for HP   Primary Quy Lotts/Referring Faylynn Stamos:  Dr Amador Cunas  CC:  4 week follow up. Pt states breathing is much better from last visit. 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  He has lost his job since the last visit, and feels that he has greatly improved since being out of that environment.  His prednisone has been tapered down, and he remains asymptomatic.  He denies cough and congestion, and feels that his breathing with exertional activities has greatly improved.  Current Medications (verified): 1)  Prednisone 10 Mg Tabs (Prednisone) .... Take 1 Tablet By Mouth Once A Day 2)  Flonase 50 Mcg/act Susp (Fluticasone Propionate) .... Uad  Allergies (verified): No Known Drug Allergies  Review of Systems       The patient complains of acid heartburn, indigestion, and nasal congestion/difficulty breathing through nose.  The patient denies shortness of breath with activity, shortness of breath at rest, productive cough, non-productive cough, coughing up blood, chest pain, irregular heartbeats, loss of appetite, weight change, abdominal pain, difficulty swallowing, sore throat, tooth/dental problems, headaches, sneezing, itching, ear ache, anxiety, depression, hand/feet swelling, joint stiffness or pain, rash, change in color of mucus, and fever.    Vital Signs:  Patient profile:   55 year old male Height:      75.5 inches Weight:      277.13 pounds O2 Sat:      95 % on Room air Temp:     98.6 degrees F oral Pulse rate:   101 / minute BP sitting:   124 / 78  (right arm) Cuff size:   large  Vitals Entered By: Carver Fila (August 25, 2010 11:49 AM)  O2 Flow:  Room air CC: 4 week follow up. Pt states breathing is much better from last visit. Pt states he has no concersn or problems to today. Comments meds and allergies  updated Phone number updated Carver Fila  August 25, 2010 11:50 AM    Physical Exam  General:  obese male in nad Lungs:  minimal basilar crackles, no wheezing Heart:  rrr, no mrg Extremities:  no edema or cyanosis  Neurologic:  alert and oriented, moves all 4.   Impression & Recommendations:  Problem # 1:  UNSPECIFIED ALLERGIC ALVEOLITIS AND PNEUMONITIS (ICD-495.9) the pt has made considerable improvement since the last visit.  His steroids have been tapered to low dose, and he has continued to improve.  His oxygen saturations today are the best they have ever been.  I think getting out of his work environment has been for the best.  At this point, would like to taper him off the prednisone and see how he does.  Other Orders: Est. Patient Level III (78295)  Patient Instructions: 1)  decrease prednisone to 7.5mg  a day for 5 days, then 5mg  a day for 5days, then 2.5mg  a day for 5 days, then stop 2)  followup with me in 2mos.

## 2011-01-11 NOTE — Progress Notes (Signed)
Summary: work note-PT CALLED BACK AGAIN  Phone Note Call from Patient   Caller: Patient Call For: clance Summary of Call: pt requesting letter for work stating that work environment is not good for his condition. Initial call taken by: Rickard Patience,  August 09, 2010 11:45 AM  Follow-up for Phone Call        Spoke with pt.  He states that he needs a letter stating that his job was making his condition worse.  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  Pls advise thanks! Follow-up by: Vernie Murders,  August 09, 2010 11:58 AM  Additional Follow-up for Phone Call Additional follow up Details #1::        pt called back re: status of letter. Tivis Ringer, CNA  August 23, 2010 10:19 AM  Aundra Millet do you know if [facility] has doen this letter yet? I do not see it in EMR. Pelase advise. Carron Curie CMA  August 23, 2010 10:55 AM     Additional Follow-up for Phone Call Additional follow up Details #2::    KC,  pt is calling back again regarding the status of a letter for his job. have you done a letter for pt?  Arman Filter LPN  August 23, 2010 12:27 PM   Additional Follow-up for Phone Call Additional follow up Details #3:: Details for Additional Follow-up Action Taken: discussed at ov. Additional Follow-up by: Barbaraann Share MD,  August 25, 2010 4:12 PM

## 2011-01-13 NOTE — Progress Notes (Addendum)
  Phone Note Other Incoming   Request: Send information Summary of Call: Request for records received from [facility]. Request forwarded to [facility].      Appended Document:  Request given to me to copy CD of xrays/CT scan films.

## 2011-01-25 ENCOUNTER — Ambulatory Visit (INDEPENDENT_AMBULATORY_CARE_PROVIDER_SITE_OTHER): Payer: Self-pay | Admitting: Family Medicine

## 2011-01-25 ENCOUNTER — Encounter: Payer: Self-pay | Admitting: Family Medicine

## 2011-01-25 VITALS — BP 140/90 | Temp 98.1°F | Ht 75.3 in | Wt 288.0 lb

## 2011-01-25 DIAGNOSIS — J309 Allergic rhinitis, unspecified: Secondary | ICD-10-CM

## 2011-01-25 MED ORDER — PREDNISONE 20 MG PO TABS
ORAL_TABLET | ORAL | Status: DC
Start: 2011-01-25 — End: 2011-04-25

## 2011-01-25 NOTE — Progress Notes (Signed)
  Subjective:    Patient ID: Douglas Chandler, male    DOB: 10-25-1956, 55 y.o.   MRN: [id number]  HPI [name] Is a 55 year old male, who comes in today because of pressure in his right ear x 2 weeks.  He said a long-standing history of allergic rhinitis.  About two weeks ago he developed some fullness in his ear.  He said some decreased hearing, otherwise, well pulmonary review of systems negative   Review of Systems     Objective:   Physical Exam    Well-developed well-nourished, male in no acute distress.  Examination HEENT was negative, except for right serous otitis media    Assessment & Plan:  Allergic rhinitis with right serous otitis media.  Plan burst and taper of prednisone

## 2011-01-25 NOTE — Patient Instructions (Signed)
Prednisone 20 m,g,,,,,,,,, one tablet x 5 days, a half x 5 days, then half a tablet Monday, Wednesday, Friday, for a two week taper.  You may also use some Afrin nasal spray at bedtime x 5 nights

## 2011-01-28 ENCOUNTER — Telehealth: Payer: Self-pay | Admitting: *Deleted

## 2011-01-28 ENCOUNTER — Ambulatory Visit (INDEPENDENT_AMBULATORY_CARE_PROVIDER_SITE_OTHER): Payer: Self-pay | Admitting: Family Medicine

## 2011-01-28 ENCOUNTER — Encounter: Payer: Self-pay | Admitting: Family Medicine

## 2011-01-28 VITALS — BP 130/90 | Temp 98.2°F | Wt 288.0 lb

## 2011-01-28 DIAGNOSIS — H6690 Otitis media, unspecified, unspecified ear: Secondary | ICD-10-CM

## 2011-01-28 DIAGNOSIS — H6691 Otitis media, unspecified, right ear: Secondary | ICD-10-CM

## 2011-01-28 MED ORDER — AMOXICILLIN 500 MG PO CAPS: 500.0000 mg | ORAL_CAPSULE | Freq: Three times a day (TID) | ORAL | Status: AC

## 2011-01-28 MED ORDER — HYDROCODONE-ACETAMINOPHEN 7.5-750 MG PO TABS: 1.0000 | ORAL_TABLET | Freq: Four times a day (QID) | ORAL | Status: AC | PRN

## 2011-01-28 NOTE — Patient Instructions (Signed)
Take the Amoxicillin until  the bottles emptying.  One shot of Afrin nasal spray up each nostril at bedtime for the next 5 nights followed by the steroid nasal spray

## 2011-01-28 NOTE — Telephone Encounter (Signed)
patient  Coming in today for evaluation

## 2011-01-28 NOTE — Progress Notes (Signed)
  Subjective:    Patient ID: Douglas Chandler, male    DOB: [birth year], 55 y.o.   MRN: [id number]  HPI Douglas Chandler Is a 55 year old, married male, nonsmoker, who comes in today for evaluation of right ear pain x 2 days.  If the cold for about a week or 10 days and then he developed severe right ear pain.  It's also has some fullness and popping in his left ear.  He states his pain on a scale of one to 10 is about a 6.  No fever, cough, nausea, vomiting, diarrhea, etc.     Review of Systems neg    Objective:   Physical Exam Well-developed well-nourished, male in no acute distress.  Examination of the HEENT is negative except for a right bulging, red eardrum also fluid in his left ear.  Neck was supple.  Thyroid is not enlarged.  No adenopathy       Assessment & Plan:  Right otitis media, left serous otitis media.  Amoxicillin 500 mg t.i.d. X 2 weeks, Afrin nasal spray nightly x 5 nights followed by the steroid nasal spray.  Return p.r.n.

## 2011-01-28 NOTE — Telephone Encounter (Signed)
Pt still cannot hear out of his ear, and is sinus pain across eyes and nose is worse.  Asking advice.

## 2011-02-02 ENCOUNTER — Telehealth: Payer: Self-pay | Admitting: *Deleted

## 2011-02-02 NOTE — Telephone Encounter (Signed)
Pt is still having trouble hearing and ears are still stopped up.  Wants to know next step?

## 2011-02-03 ENCOUNTER — Telehealth: Payer: Self-pay | Admitting: Family Medicine

## 2011-02-03 MED ORDER — FLUTICASONE PROPIONATE 50 MCG/ACT NA SUSP: 1.0000 | Freq: Every day | NASAL | Status: DC

## 2011-02-03 NOTE — Telephone Encounter (Signed)
Triage vm---pt stated that he called yesterday about his ear. Please advise and return his call.

## 2011-02-03 NOTE — Telephone Encounter (Signed)
Continue over-the-counter antihistamine add Flonase nasal spray, one shot up each nostril twice daily

## 2011-02-03 NOTE — Telephone Encounter (Signed)
patient  Is calling because he lightheaded and ears are still clogged.  He came to the office last week 2 times for an office visit.  Any suggestions?  [facility].

## 2011-02-03 NOTE — Telephone Encounter (Signed)
rx sent and patient  Is aware

## 2011-02-07 NOTE — Telephone Encounter (Signed)
Dr. Tawanna Cooler took care of this the next day.

## 2011-02-11 ENCOUNTER — Telehealth: Payer: Self-pay | Admitting: Family Medicine

## 2011-02-11 NOTE — Telephone Encounter (Signed)
Pt has taken all med for ear and sinuses. Pt still can't hear out of lft ear and partial hearing rt ear. Sinuses have improved.  Pt is wondering what he should do. Pls call.

## 2011-02-11 NOTE — Telephone Encounter (Signed)
Left message on machine for patient  And rx called in 

## 2011-02-11 NOTE — Telephone Encounter (Signed)
Prednisone 20 mg, dispense 40 tabs directions two tabs now then two tabs q.a.m. X 3 days, one x 3 days, a half x 3 days, then half a tablet Monday, Wednesday, Friday, for a two week taper.......... stop the Afrin nasal spray,,,,,ref x 1

## 2011-03-24 LAB — GLUCOSE, CAPILLARY
Glucose-Capillary: 113 mg/dL — ABNORMAL HIGH (ref 70–99)
Glucose-Capillary: 122 mg/dL — ABNORMAL HIGH (ref 70–99)
Glucose-Capillary: 141 mg/dL — ABNORMAL HIGH (ref 70–99)
Glucose-Capillary: 145 mg/dL — ABNORMAL HIGH (ref 70–99)
Glucose-Capillary: 145 mg/dL — ABNORMAL HIGH (ref 70–99)
Glucose-Capillary: 147 mg/dL — ABNORMAL HIGH (ref 70–99)
Glucose-Capillary: 154 mg/dL — ABNORMAL HIGH (ref 70–99)
Glucose-Capillary: 156 mg/dL — ABNORMAL HIGH (ref 70–99)
Glucose-Capillary: 157 mg/dL — ABNORMAL HIGH (ref 70–99)
Glucose-Capillary: 159 mg/dL — ABNORMAL HIGH (ref 70–99)
Glucose-Capillary: 163 mg/dL — ABNORMAL HIGH (ref 70–99)
Glucose-Capillary: 173 mg/dL — ABNORMAL HIGH (ref 70–99)
Glucose-Capillary: 205 mg/dL — ABNORMAL HIGH (ref 70–99)
Glucose-Capillary: 99 mg/dL (ref 70–99)

## 2011-03-24 LAB — BASIC METABOLIC PANEL
BUN: 10 mg/dL (ref 6–23)
BUN: 16 mg/dL (ref 6–23)
BUN: 7 mg/dL (ref 6–23)
CO2: 31 mEq/L (ref 19–32)
CO2: 29 mEq/L (ref 19–32)
Calcium: 9.1 mg/dL (ref 8.4–10.5)
Calcium: 8.3 mg/dL — ABNORMAL LOW (ref 8.4–10.5)
Calcium: 8.3 mg/dL — ABNORMAL LOW (ref 8.4–10.5)
Calcium: 9 mg/dL (ref 8.4–10.5)
Chloride: 102 mEq/L (ref 96–112)
Chloride: 99 mEq/L (ref 96–112)
Creatinine, Ser: 0.72 mg/dL (ref 0.4–1.5)
Creatinine, Ser: 0.76 mg/dL (ref 0.4–1.5)
GFR calc Af Amer: >60 mL/min (ref >60)
GFR calc Af Amer: >60 mL/min (ref >60)
GFR calc Af Amer: >60 mL/min (ref >60)
GFR calc non Af Amer: >60 mL/min (ref >60)
GFR calc non Af Amer: >60 mL/min (ref >60)
Glucose, Bld: 151 mg/dL — ABNORMAL HIGH (ref 70–99)
Glucose, Bld: 152 mg/dL — ABNORMAL HIGH (ref 70–99)
Potassium: 4.5 mEq/L (ref 3.5–5.1)
Sodium: 135 mEq/L (ref 135–145)
Sodium: 135 mEq/L (ref 135–145)
Sodium: 138 mEq/L (ref 135–145)

## 2011-03-24 LAB — COMPREHENSIVE METABOLIC PANEL
ALT: 27 U/L (ref 0–53)
AST: 28 U/L (ref 0–37)
Albumin: 2.4 g/dL — ABNORMAL LOW (ref 3.5–5.2)
Alkaline Phosphatase: 57 U/L (ref 39–117)
BUN: 8 mg/dL (ref 6–23)
CO2: 26 mEq/L (ref 19–32)
Chloride: 101 mEq/L (ref 96–112)
Chloride: 98 mEq/L (ref 96–112)
Creatinine, Ser: 0.66 mg/dL (ref 0.4–1.5)
GFR calc Af Amer: >60 mL/min (ref >60)
Glucose, Bld: 137 mg/dL — ABNORMAL HIGH (ref 70–99)
Potassium: 3.9 mEq/L (ref 3.5–5.1)
Sodium: 133 mEq/L — ABNORMAL LOW (ref 135–145)
Total Bilirubin: 1 mg/dL (ref 0.3–1.2)
Total Bilirubin: 0.5 mg/dL (ref 0.3–1.2)

## 2011-03-24 LAB — POCT I-STAT 3, ART BLOOD GAS (G3+)
Acid-Base Excess: 3 mmol/L — ABNORMAL HIGH (ref 0.0–2.0)
Bicarbonate: 27.3 mEq/L — ABNORMAL HIGH (ref 20.0–24.0)
O2 Saturation: 97 %
Patient temperature: 97.5
Patient temperature: 98.2
Patient temperature: 99.7
TCO2: 29 mmol/L (ref 0–100)
TCO2: 34 mmol/L (ref 0–100)
pCO2 arterial: 47.5 mmHg — ABNORMAL HIGH (ref 35.0–45.0)
pCO2 arterial: 58.5 mmHg (ref 35.0–45.0)
pH, Arterial: 7.346 — ABNORMAL LOW (ref 7.350–7.450)
pH, Arterial: 7.365 (ref 7.350–7.450)

## 2011-03-24 LAB — CBC
HCT: 52.8 % — ABNORMAL HIGH (ref 39.0–52.0)
HCT: 42.8 % (ref 39.0–52.0)
Hemoglobin: 17.6 g/dL — ABNORMAL HIGH (ref 13.0–17.0)
Hemoglobin: 15.9 g/dL (ref 13.0–17.0)
MCHC: 34.3 g/dL (ref 30.0–36.0)
MCV: 94.4 fL (ref 78.0–100.0)
MCV: 94.9 fL (ref 78.0–100.0)
MCV: 95.1 fL (ref 78.0–100.0)
Platelets: 251 10*3/uL (ref 150–400)
Platelets: 257 10*3/uL (ref 150–400)
Platelets: 324 10*3/uL (ref 150–400)
Platelets: 352 10*3/uL (ref 150–400)
RBC: 5.53 MIL/uL (ref 4.22–5.81)
RBC: 4.64 MIL/uL (ref 4.22–5.81)
RBC: 4.92 MIL/uL (ref 4.22–5.81)
RDW: 13 % (ref 11.5–15.5)
RDW: 13.3 % (ref 11.5–15.5)
WBC: 10.5 10*3/uL (ref 4.0–10.5)
WBC: 11.3 10*3/uL — ABNORMAL HIGH (ref 4.0–10.5)
WBC: 11.3 10*3/uL — ABNORMAL HIGH (ref 4.0–10.5)
WBC: 13.7 10*3/uL — ABNORMAL HIGH (ref 4.0–10.5)
WBC: 15.1 10*3/uL — ABNORMAL HIGH (ref 4.0–10.5)

## 2011-03-24 LAB — EXPECTORATED SPUTUM ASSESSMENT W GRAM STAIN, RFLX TO RESP C

## 2011-03-24 LAB — CULTURE, RESPIRATORY W GRAM STAIN

## 2011-03-24 LAB — FUNGUS CULTURE W SMEAR: Fungal Smear: NONE SEEN

## 2011-03-24 LAB — ANGIOTENSIN CONVERTING ENZYME: Angiotensin-Converting Enzyme: 50 U/L (ref 9–67)

## 2011-03-24 LAB — CULTURE, BLOOD (ROUTINE X 2): Culture: NO GROWTH

## 2011-03-24 LAB — SEDIMENTATION RATE: Sed Rate: 54 mm/hr — ABNORMAL HIGH (ref 0–16)

## 2011-03-24 LAB — POCT I-STAT 7, (LYTES, BLD GAS, ICA,H+H)
Acid-base deficit: 3 mmol/L — ABNORMAL HIGH (ref 0.0–2.0)
Bicarbonate: 30.8 mEq/L — ABNORMAL HIGH (ref 20.0–24.0)
O2 Saturation: 94 %
TCO2: 34 mmol/L (ref 0–100)
pCO2 arterial: 95.4 mmHg (ref 35.0–45.0)
pO2, Arterial: 99 mmHg (ref 80.0–100.0)

## 2011-03-24 LAB — CROSSMATCH
ABO/RH(D): O POS
Antibody Screen: NEGATIVE

## 2011-03-24 LAB — DIFFERENTIAL
Basophils Absolute: 0.1 10*3/uL (ref 0.0–0.1)
Basophils Relative: 1 % (ref 0–1)
Eosinophils Absolute: 0 10*3/uL (ref 0.0–0.7)
Neutro Abs: 8.4 10*3/uL — ABNORMAL HIGH (ref 1.7–7.7)
Neutrophils Relative %: 81 % — ABNORMAL HIGH (ref 43–77)

## 2011-03-24 LAB — AFB CULTURE WITH SMEAR (NOT AT ARMC)

## 2011-03-24 LAB — PROTIME-INR: INR: 1.1 (ref 0.00–1.49)

## 2011-03-24 LAB — ABO/RH: ABO/RH(D): O POS

## 2011-03-24 LAB — TISSUE CULTURE

## 2011-03-24 LAB — APTT: aPTT: 49 seconds — ABNORMAL HIGH (ref 24–37)

## 2011-03-24 LAB — BRAIN NATRIURETIC PEPTIDE: Pro B Natriuretic peptide (BNP): 111 pg/mL — ABNORMAL HIGH (ref 0.0–100.0)

## 2011-03-29 LAB — CULTURE, RESPIRATORY W GRAM STAIN

## 2011-03-29 LAB — BODY FLUID CELL COUNT WITH DIFFERENTIAL
Eos, Fluid: 2 %
Lymphs, Fluid: 20 %
Monocyte-Macrophage-Serous Fluid: 33 % — ABNORMAL LOW (ref 50–90)
Total Nucleated Cell Count, Fluid: 615 cu mm (ref 0–1000)

## 2011-03-29 LAB — FUNGUS CULTURE W SMEAR

## 2011-03-29 LAB — BASIC METABOLIC PANEL
BUN: 9 mg/dL (ref 6–23)
CO2: 27 mEq/L (ref 19–32)
Calcium: 9.8 mg/dL (ref 8.4–10.5)
Creatinine, Ser: 0.95 mg/dL (ref 0.4–1.5)
GFR calc non Af Amer: >60 mL/min (ref >60)
Glucose, Bld: 108 mg/dL — ABNORMAL HIGH (ref 70–99)
Sodium: 138 mEq/L (ref 135–145)

## 2011-03-29 LAB — CBC
MCHC: 33.8 g/dL (ref 30.0–36.0)
Platelets: 254 10*3/uL (ref 150–400)
RDW: 12.8 % (ref 11.5–15.5)

## 2011-03-29 LAB — DIFFERENTIAL
Basophils Absolute: 0 10*3/uL (ref 0.0–0.1)
Basophils Relative: 0 % (ref 0–1)
Lymphocytes Relative: 10 % — ABNORMAL LOW (ref 12–46)
Monocytes Absolute: 1.2 10*3/uL — ABNORMAL HIGH (ref 0.1–1.0)
Neutro Abs: 8.6 10*3/uL — ABNORMAL HIGH (ref 1.7–7.7)
Neutrophils Relative %: 78 % — ABNORMAL HIGH (ref 43–77)

## 2011-03-29 LAB — AFB CULTURE WITH SMEAR (NOT AT ARMC): Acid Fast Smear: NONE SEEN

## 2011-04-25 ENCOUNTER — Encounter: Payer: Self-pay | Admitting: Pulmonary Disease

## 2011-04-25 ENCOUNTER — Ambulatory Visit (INDEPENDENT_AMBULATORY_CARE_PROVIDER_SITE_OTHER)
Admission: RE | Admit: 2011-04-25 | Discharge: 2011-04-25 | Disposition: A | Discharge status: Home / Self Care | Payer: 59 | Source: Ambulatory Visit | Attending: Pulmonary Disease | Admitting: Pulmonary Disease

## 2011-04-25 ENCOUNTER — Ambulatory Visit (INDEPENDENT_AMBULATORY_CARE_PROVIDER_SITE_OTHER): Payer: 59 | Admitting: Pulmonary Disease

## 2011-04-25 VITALS — BP 156/86 | HR 85 | Temp 97.3°F | Ht 76.0 in | Wt 282.6 lb

## 2011-04-25 DIAGNOSIS — J189 Pneumonia, unspecified organism: Secondary | ICD-10-CM

## 2011-04-25 DIAGNOSIS — J679 Hypersensitivity pneumonitis due to unspecified organic dust: Secondary | ICD-10-CM

## 2011-04-25 NOTE — Assessment & Plan Note (Addendum)
The pt continues to do very well since leaving his prior working environment.  His breathing has been stable, and he is working on losing weight.  I would like to see him back one more time in one year with final cxr.  His cxr today appears fairly clear, and stable from his last.

## 2011-04-25 NOTE — Progress Notes (Signed)
  Subjective:    Patient ID: Douglas Chandler, male    DOB: Aug 15, 1956, 55 y.o.   MRN: [id number]  HPI The pt comes in today for f/u of his HP.  He has been out of the offending environment, and has done very well from a breathing standpoint.  He denies any cough or congestion, but does have PND with throat clearing.  He feels his exertional tolerance is at a much better baseline, and is trying to work on weight loss.    Review of Systems  Constitutional: Negative for fever and unexpected weight change.  HENT: Positive for congestion, sneezing and sinus pressure. Negative for ear pain, nosebleeds, sore throat, rhinorrhea, trouble swallowing, dental problem and postnasal drip.   Eyes: Positive for redness and itching.  Respiratory: Negative for cough, chest tightness, shortness of breath and wheezing.   Cardiovascular: Positive for palpitations. Negative for leg swelling.  Gastrointestinal: Negative for nausea and vomiting.  Genitourinary: Negative for dysuria.  Musculoskeletal: Negative for joint swelling.  Skin: Negative for rash.  Neurological: Negative for headaches.  Hematological: Does not bruise/bleed easily.  Psychiatric/Behavioral: Negative for dysphoric mood. The patient is not nervous/anxious.        Objective:   Physical Exam Ow male in nad Chest clear to auscultation, no wheezing Cor with rrr LE without edema, no cyanosis Alert and oriented, moves all 4        Assessment & Plan:

## 2011-04-25 NOTE — Patient Instructions (Signed)
Work on weight loss and conditioning Will see you back one more time in one year with cxr same day.

## 2011-04-26 NOTE — Op Note (Signed)
NAMECASWELL, ALVILLAR                 ACCOUNT NO.:  [id number]   MEDICAL RECORD NO.:  [id number]          PATIENT TYPE:  INP   LOCATION:  2303                         FACILITY:  [facility]   PHYSICIAN:  Ines Bloomer, M.D. DATE OF BIRTH:  06/03/1956   DATE OF PROCEDURE:  DATE OF DISCHARGE:                               OPERATIVE REPORT   PREOPERATIVE DIAGNOSIS:  Acute respiratory insufficiency, probable  interstitial lung disease.   POSTOPERATIVE DIAGNOSIS:  Acute respiratory insufficiency, probable  interstitial lung disease.   OPERATION PERFORMED:  Left VATS lung biopsy x3.   SURGEON:  Ines Bloomer, M.D.   FIRST ASSISTANT:  Doree Fudge, PA-C   General anesthesia.   After percutaneous insertion of all monitoring lines, the patient  underwent general anesthesia, he was prepped and draped in the usual  sterile manner.  The patient was turned to the left lateral thoracotomy  position.  Three trocar sites were made, 1 in the anterior axillary line  at the seventh intercostal space, 1 in the posterior axillary line at  the seventh intercostal space, and 1 in the mid axillary line at the  fifth intercostal space, 3 trocars were inserted.  The left lung had  been deflated with the dual-lumen tube, it was biopsied by inserting a  Covidien 45 Roticulator stapler through the posterior trocar site and  grasping the lingula with a 2S Kaiser ring forceps and then firing the  stapler through the posterior trocar site with 2 applications.  This was  sent for frozen section as well as culture and revealed probable  interstitial lung disease, no cancer.  Then the medial basilar segments  of the left lower lobe was grasped and then in-like manner accompanied  with the Covidien stapler, this was resected with 2 applications, and  finally, the superior segment of the left lower lobe was grasped with a  ring forceps and then resected with 2 applications down the left upper  lobe with 2  staples.  Chest tube was brought through the anterior trocar  site and tied in place with 0 silk.  Marcaine block was done in the  usual fashion.  A single On-Q was inserted in the usual fashion.  The  other 2 trocar sites were closed with 2-0 Vicryl and 3-0 Vicryl with a  subcuticular stitch.  Dermabond for the skin.  The lung was reexpanded.  The patient returned to the recovery room in a serious condition.      Ines Bloomer, M.D.  Electronically Signed     DPB/MEDQ  D:  02/10/2009  T:  02/11/2009  Job:  [id number]

## 2011-04-26 NOTE — Op Note (Signed)
Douglas Chandler, Douglas Chandler                 ACCOUNT NO.:  [id number]   MEDICAL RECORD NO.:  [id number]          PATIENT TYPE:  AMB   LOCATION:  CARD                         FACILITY:  [facility]   PHYSICIAN:  Leslye Peer, MD    DATE OF BIRTH:  1956/02/18   DATE OF PROCEDURE:  02/04/2009  DATE OF DISCHARGE:                               OPERATIVE REPORT   PROCEDURE:  Fiberoptic bronchoscopy with bronchoalveolar lavage and  transbronchial biopsies.   OPERATOR:  Leslye Peer, MD.   INDICATIONS:  Diffuse bilateral pulmonary infiltrates.   CONSENT:  Obtained from the patient and a copy is on his hospital chart.   MEDICATIONS GIVEN:  1. Fentanyl 100 mcg IV in divided doses.  2. Versed 4 mg IV in divided doses.  3. Lidocaine 1% to the bronchoalveolar tree for 24 mL total.   PROCEDURE DETAILS:  After informed consent was obtained as indicated  above, conscious sedation was initiated.  The patient presented to the  procedure with saturations between 88-90% on room air.  He was placed on  supplemental oxygen during the conscious sedation initiation and the  procedure was performed on a partial non-rebreather mask.  His  saturations remained between 88-92% for the majority the case.  There  was a period of transient desaturation to 86% for about 1 minute, but he  quickly recovered.  The 86% was his lowest saturation.  The fiberoptic  bronchoscope was introduced through the right naris without difficulty.  Local anesthesia was achieved with 1% lidocaine.  The trachea was  intubated and was normal in appearance.  The entire airway exam was  normal.  This included detailed inspection of the main carina, right and  left mainstem bronchi.  The right upper lobe airways, right middle lobe  airways, right lower lobe airways and a bronchus intermedius were all  within normal limits without any endobronchial lesions or abnormal  secretions.  The left upper lobe lingular and left lower lobe airways  were  all normal without any secretions or endobronchial lesion.  Attention was then turned to the right side for washings and biopsies.  A bronchoalveolar lavage with 60 mL of normal saline instilled and  approximately 20 mL returned was performed in the right upper lobe.  Transbronchial biopsies were then performed from several subsegments of  the right lower lobe to be sent for pathology.  The patient tolerated  the procedure well.  His oxygenation was as indicated above.  There was  minimal bleeding and good hemostasis at the end of the procedure.  In  recovery his oxygen has been weaned to 2 L and I anticipate that it will  be weaned off prior to discharge to his baseline saturations of 88-90%.   SAMPLES:  1. Bronchoalveolar lavage from the right upper lobe.  2. Transbronchial biopsies from the right lower lobe.   PLAN:  I will follow-up the culture, cytology and pathology results with  Mr. Kau when they become available and then review the results with  Barbaraann Share, MD, FCCP to plan therapy.  Leslye Peer, MD  Electronically Signed     RSB/MEDQ  D:  02/04/2009  T:  02/05/2009  Job:  [id number]   cc:   Barbaraann Share, MD,FCCP  [address]  [place]  [place] [address]

## 2011-04-26 NOTE — Assessment & Plan Note (Signed)
OFFICE VISIT   Douglas Chandler, Douglas Chandler  DOB:  1955-12-18                                        May 20, 2009  CHART #:  [id number]   Blood pressure was 140/87, pulse 91, respirations 18, and sats were 95%.  His incisions were well healed.  His lungs are clear to auscultation and  percussion.  He is breathing well overall and doing well after his VATS  lung biopsy.  We will see him back again if he has any future problem.   Ines Bloomer, M.D.  Electronically Signed   DPB/MEDQ  D:  05/20/2009  T:  05/20/2009  Job:  [id number]

## 2011-04-26 NOTE — Assessment & Plan Note (Signed)
OFFICE VISIT   KINCADE, GRANBERG  DOB:  [birth year]                                        March 17, 2009  CHART #:  [id number]   The patient came for followup today and his incisions are healing well.  The anterior trocar sites still had some eschar on it.  His blood  pressure was 127/77, pulse 88, respirations 18, and sats were 97%.  He  is doing well overall.  He has made remarkable improvement since we did  his lung biopsy.  We plan to see him back again in 2 months for a final  check.   Ines Bloomer, M.D.  Electronically Signed   DPB/MEDQ  D:  03/17/2009  T:  03/17/2009  Job:  [id number]

## 2011-04-26 NOTE — Assessment & Plan Note (Signed)
[facility]                                 ON-CALL NOTE   Douglas Chandler, Douglas Chandler                        MRN:          [id number]  DATE:02/08/2009                            DOB:          24-May-1956    PRIMARY PULMONOLOGIST:  Leslye Peer, MD   TELEPHONE NUMBER:  [phone].   CALLER:  The patient.   CHIEF COMPLAINT:  Shortness of breath and fever.   HISTORY OF PRESENT ILLNESS:  This is a patient of Dr. Delton Coombes who  underwent bronchoscopy with biopsies this past Wednesday.  He developed  fever on Friday and new cough.  His fever on Friday was 102.2.  He  called the on-call physician yesterday and was prescribed Avelox, which  he began yesterday.  He has noticed over the past couple of days that he  has developed increased rattling in his chest and cough productive of  yellow-brown sputum.  He denies hemoptysis.  He does now feel more short  of breath than he did prior to the procedure such that he feels a little  winded after walking around a couple of rooms in his home.  He also  notices that when he places CPAP on his face when lying down, he feels  increased pressure in his chest.  He is concerned that he is feeling  worse despite being on Avelox.   ASSESSMENT AND PLAN:  Likely post bronchoscopic bronchitis, possible  pneumonia.   The patient is speaking easily in complete sentences and I explained  that should the shortness of breath not worsen throughout the day that  he could present to the clinic for evaluation tomorrow.  However, the  patient is concerned as his shortness of breath has gotten progressively  worse throughout the day until he called me, and therefore I advised him  to present to the emergency department for further evaluation.     Sherstin Robin Searing, MD  Electronically Signed    STL/MedQ  DD: 02/08/2009  DT: 02/09/2009  Job #: [id number]   cc:   Leslye Peer, MD

## 2011-04-26 NOTE — Discharge Summary (Signed)
Douglas Chandler NO.:  [id number]   MEDICAL RECORD NO.:  [id number]          PATIENT TYPE:  INP   LOCATION:  3306                         FACILITY:  [facility]   PHYSICIAN:  Barbaraann Share, MD,FCCPDATE OF BIRTH:  June 22, 1956   DATE OF ADMISSION:  02/09/2009  DATE OF DISCHARGE:  02/18/2009                               DISCHARGE SUMMARY   CONSULTATIONS:  Gordy Savers, MD  Ines Bloomer, M.D.   DISCHARGE DIAGNOSES:  1. Acute and chronic respiratory failure with demonstrated pneumonitis      from video assisted thoracic surgery.  2. Obstructive sleep apnea.   HISTORY OF PRESENT ILLNESS:  Douglas Chandler is a 55 year old white male  patient of Dr. Marcelyn Bruins and Dr. Amador Cunas.  He was evaluated by  the pulmonary physician prior to admission to the hospital on March 1.  He continued to have progressive dyspnea on exertion, hypoxia since  December 2009.  Chest x-ray was not informative.  High resolution chest  CT showed bilateral diffuse ground glass infiltrates.  He was admitted  on March 1 for video assisted thoracic surgery and further evaluation  and treatment.  Evaluation myocardial vascular thoracic surgeon.   LABORATORY DATA:  Hemoglobin 15.9, hematocrit 46.5, platelets 382.  WBCs  13.7.  Sodium 138, potassium 4.3, chloride 105, CO2 28, BUN 19,  creatinine 0.72, glucose 143.  AST 28, ALT 27, alk-phos 44, total  bilirubin 0.5, albumin 2.4.  Calcium 9.0.  Fungus culture demonstrates  no yeast.  This is a preliminary.  AFB shows no AFB, still is a  preliminary.  Tissue culture demonstrates no growth, final.  Sputum  evaluation shows normal flora.  Blood cultures showed no growth, final.  ESR is 54.  TSH 1.596.  Chest x-ray demonstrates right IJ central line  stable, patchy interstitial air space opacities left mid lung.  Left  lung base and right inferior infrahilar region are largely stable.  Surgical pathology demonstrates from a lung resection from  the lingula,  nonnecrotizing granulomas, hypersensitivity reaction, chronic in nature.  Findings are consistent with chronic hypersensitivity and pneumonia.   HOSPITAL COURSE:  1. Acute on chronic respiratory failure and dyspnea on exertion.  He      was admitted on February 09, 2009, and evaluated by cardiovascular      thoracic surgeon, Dr. Karle Plumber.  He was taken to the OR for      left video assisted thoracic surgery on February 10, 2009.  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  He was then      placed on high dose steroids and will be continued to be monitored      as outpatient by Dr. Marcelyn Bruins.  Of note, his home environment      and work environment will need to be evaluated for triggers through      a separate sensitivity pneumonitis.  He will remain on steroids      until reevaluated by Dr. Shelle Iron on March 05, 2009.  He will      followup with  Dr.  Karle Plumber to monitor his suture insertion      sites on the left where he had a video assisted thoracic surgery.      Those wounds are healing well and sutures have  been removed.  He      is being discharged in improved condition.  2. Obstructive sleep apnea.  Remains on a CPAP.   DISCHARGE MEDICATIONS:  1. Percocet 1-2 tablets q.4-6 h p.r.n. pain.  2. Prednisone 40 mg daily until evaluated by Dr. Marcelyn Bruins on March 05, 2009, at which time he may be tapered or continued.   DISCHARGE INSTRUCTIONS:  Diet:  As tolerated.   SPECIAL INSTRUCTIONS:  Wound care is to clean with water and hydrogen  peroxide as needed.  Steri-Strips will be allowed to fall off and enough  opened air.  He has been instructed to continue using the incentive  spirometer.  He has a follow up appointment with Dr. Marcelyn Bruins on  March 05, 2009, at 3:15 p.m.  He was to follow up with Dr. Karle Plumber, his office will call and make an appointment.   DISPOSITION:  He is no longer oxygen dependent.  He underwent  left video  assisted thoracic surgery with finding of pneumonitis.  He is being  discharged in improved condition.      Devra Dopp, MSN, ACNP      Barbaraann Share, MD,FCCP  Electronically Signed    SM/MEDQ  D:  02/16/2009  T:  02/16/2009  Job:  [id number]
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
## 2011-04-26 NOTE — Assessment & Plan Note (Signed)
OFFICE VISIT   Douglas Chandler, Douglas Chandler  DOB:  1956-01-07                                        February 24, 2009  CHART #:  [id number]   HISTORY OF PRESENT ILLNESS:  The patient is status post left video-  assisted thoracoscopic surgery with lung biopsy x3 done by Dr. Edwyna Shell on  February 10, 2009.  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  The patient was started on  steroids postoperatively and was discharged to home by Dr. Shelle Iron on  February 18, 2009, in stable condition.  At discharge, Dr. Shelle Iron had setup  an appointment to see him on March 05, 2009, at that time we will plan  to possibly taper his prednisone dose.  The patient presents today for  followup visit.  The patient is progressing well.  He denies any  significant pain, shortness of breath, opening or drainage from any of  his incision sites, nausea or vomiting.  He is up ambulating well  without difficulty.   PHYSICAL EXAMINATION:  VITAL SIGNS:  Blood pressure 142/84, pulse of 88,  respirations of 18, and O2 sats 96% on room air.  RESPIRATORY:  Clear to auscultation bilaterally.  CARDIAC:  Regular rate and rhythm.  INCISIONS:  The patient's incisions are clean, dry, and intact and  healing well.   STUDIES:  The patient had a PA and lateral chest x-ray today, which is  stable and mild atelectasis bilaterally.   IMPRESSION AND PLAN:  The patient is progressing well.  He plans to  follow up with Dr. Shelle Iron on March 05, 2009.  We will continue the  patient on prednisone still sees Dr. Shelle Iron.  He is told it is okay for  him to start driving short distances.  He is to continue ambulating 3-4  times per day.  He continues using his incentive spirometer.  He was  given a note to return to work after facility has been tested and  cleared for any fungal infection or growth.  Plan to have the  patient  come back to see Dr. Edwyna Shell in 3 weeks with a repeat chest x-ray for  further followup.  The patient is told in the interim if he has any surgical issues, he is  to contact us.  The patient is in agreement.   Ines Bloomer, M.D.  Electronically Signed   KMD/MEDQ  D:  02/24/2009  T:  02/24/2009  Job:  [id number]   cc:   Ines Bloomer, M.D.  Barbaraann Share, MD,FCCP

## 2011-04-29 NOTE — Letter (Signed)
August 26, 2010     RE:  [name], [name]  MRN:  [id number]  /  DOB:  19-Jun-1956   To whom it may concern:   I am writing on behalf of Mr. Douglas Chandler, a patient who I follow for  hypersensitivity pneumonitis with secondary dyspnea on exertion.  Mr.  Douglas Chandler was diagnosed with hypersensitivity pneumonitis that was quite  extensive by surgical lung biopsy, and was subsequently treated with a  course of prednisone for his symptoms.  He underwent an allergy and  hypersensitivity panel in order to find the cause of his inflammatory  lung disease, all of which returned unremarkable.  He then had an  environmental survey of his home with nothing being found.  It came to  my attention that Mr. 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  These can sometimes be  associated with hypersensitivity pneumonitis.  Mr. Douglas Chandler was treated  aggressively with oral steroids with improvement of his symptoms;  however, as soon as the steroids were tapered, he had a return of his  symptoms.  This is usually associated with ongoing exposure to the  offending agent.  I was very concerned that Mr. Douglas Chandler was being exposed  to something in his job that led to this particular condition, but he  was obviously trying to stay in his job while we try to minimize  symptoms with the prednisone.  He ultimately got to the point where I  felt it was medically necessary that he have a hiatus from his job or  possibly changing jobs in order to avoid exposure to metal working  fluids.   It has now come to my attention that Mr. Douglas Chandler has been dismissed from  his job, and he has subsequently followed up with me.  His oxygen  saturations have greatly improved since being out of his work  environment.  His breathing is nearing his normal baseline on minimal  prednisone dosing.  There is no question in my mind, after Mr Douglas Chandler's  followup visit, the patient's work environment was  leading to his  inflammatory lung disease.   If I can be of further assistance or answer any other questions, please  feel free to call.    Sincerely,      Barbaraann Share, MD,FCCP  Electronically Signed    KMC/MedQ  DD: 08/26/2010  DT: 08/26/2010  Job #: [id number]

## 2012-04-24 ENCOUNTER — Ambulatory Visit: Payer: 59 | Admitting: Pulmonary Disease

## 2012-04-27 ENCOUNTER — Other Ambulatory Visit: Payer: Self-pay | Admitting: Pulmonary Disease

## 2012-04-27 DIAGNOSIS — J679 Hypersensitivity pneumonitis due to unspecified organic dust: Secondary | ICD-10-CM

## 2012-04-30 ENCOUNTER — Ambulatory Visit: Payer: 59 | Admitting: Pulmonary Disease

## 2012-05-10 ENCOUNTER — Encounter: Payer: Self-pay | Admitting: Gastroenterology

## 2012-05-15 ENCOUNTER — Ambulatory Visit (INDEPENDENT_AMBULATORY_CARE_PROVIDER_SITE_OTHER): Payer: Self-pay | Admitting: Pulmonary Disease

## 2012-05-15 ENCOUNTER — Encounter: Payer: Self-pay | Admitting: Pulmonary Disease

## 2012-05-15 ENCOUNTER — Ambulatory Visit (INDEPENDENT_AMBULATORY_CARE_PROVIDER_SITE_OTHER)
Admission: RE | Admit: 2012-05-15 | Discharge: 2012-05-15 | Disposition: A | Discharge status: Home / Self Care | Payer: Self-pay | Source: Ambulatory Visit | Attending: Pulmonary Disease | Admitting: Pulmonary Disease

## 2012-05-15 VITALS — BP 142/92 | HR 73 | Temp 97.7°F | Ht 76.0 in | Wt 280.2 lb

## 2012-05-15 DIAGNOSIS — J679 Hypersensitivity pneumonitis due to unspecified organic dust: Secondary | ICD-10-CM

## 2012-05-15 NOTE — Progress Notes (Signed)
  Subjective:    Patient ID: Douglas Chandler, male    DOB: [birth year], 56 y.o.   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  He has done very well since being out of his work environment, and feels that he has returned to his normal baseline.  He has no cough, and feels his exertional tolerance is reasonable.  He still has some doe, but believes this is due to his weight and conditioning.  His f/u cxr today is clear.    Review of Systems  Constitutional: Negative.  Negative for fever and unexpected weight change.  HENT: Positive for rhinorrhea. Negative for ear pain, nosebleeds, congestion, sore throat, sneezing, trouble swallowing, dental problem, postnasal drip and sinus pressure.   Eyes: Negative.  Negative for redness and itching.  Respiratory: Negative.  Negative for cough, chest tightness, shortness of breath and wheezing.   Cardiovascular: Negative.  Negative for palpitations and leg swelling.  Gastrointestinal: Negative.  Negative for nausea and vomiting.  Genitourinary: Negative.  Negative for dysuria.  Musculoskeletal: Negative.  Negative for joint swelling.  Skin: Negative for rash.  Neurological: Positive for headaches.  Hematological: Negative.  Does not bruise/bleed easily.  Psychiatric/Behavioral: Negative.  Negative for dysphoric mood. The patient is not nervous/anxious.        Objective:   Physical Exam Ow male in nad Nose without purulence or discharge Chest totally clear to auscultation, no wheezing or crackles. Cor with rrr LE with no significant edema, no cyanosis  Alert and oriented, moves all 4.        Assessment & Plan:

## 2012-05-15 NOTE — Patient Instructions (Signed)
Can try chlorpheniramine 4mg  take 2 at bedtime and one at lunch for the "bad allergy days" in the place of your claritin. Work on exercise program and weight loss followup with me as needed.

## 2012-05-15 NOTE — Assessment & Plan Note (Signed)
The patient has done very well after getting out of his work environment and treated with a course of prednisone.  His breathing has returned to his usual baseline, and his chest x-ray today is clear.  I have encouraged him to work aggressively on weight loss as well as conditioning.  No further pulmonary followup is required at this time, but the patient will call if issues develop.

## 2012-05-22 ENCOUNTER — Ambulatory Visit: Payer: Self-pay

## 2012-05-22 ENCOUNTER — Other Ambulatory Visit: Payer: Self-pay | Admitting: Occupational Medicine

## 2012-05-22 DIAGNOSIS — R52 Pain, unspecified: Secondary | ICD-10-CM

## 2013-03-22 ENCOUNTER — Encounter: Payer: Self-pay | Admitting: Gastroenterology

## 2014-03-02 ENCOUNTER — Emergency Department (HOSPITAL_COMMUNITY): Payer: BC Managed Care – PPO

## 2014-03-02 ENCOUNTER — Encounter (HOSPITAL_COMMUNITY): Payer: Self-pay | Admitting: Emergency Medicine

## 2014-03-02 ENCOUNTER — Observation Stay (HOSPITAL_COMMUNITY)
Admission: EM | Admit: 2014-03-02 | Discharge: 2014-03-03 | Disposition: A | Discharge status: Home / Self Care | Payer: BC Managed Care – PPO | Attending: Internal Medicine | Admitting: Internal Medicine

## 2014-03-02 DIAGNOSIS — R42 Dizziness and giddiness: Secondary | ICD-10-CM | POA: Insufficient documentation

## 2014-03-02 DIAGNOSIS — Z9981 Dependence on supplemental oxygen: Secondary | ICD-10-CM | POA: Insufficient documentation

## 2014-03-02 DIAGNOSIS — G43909 Migraine, unspecified, not intractable, without status migrainosus: Secondary | ICD-10-CM | POA: Insufficient documentation

## 2014-03-02 DIAGNOSIS — R079 Chest pain, unspecified: Secondary | ICD-10-CM

## 2014-03-02 DIAGNOSIS — G4730 Sleep apnea, unspecified: Secondary | ICD-10-CM | POA: Insufficient documentation

## 2014-03-02 DIAGNOSIS — Z872 Personal history of diseases of the skin and subcutaneous tissue: Secondary | ICD-10-CM | POA: Insufficient documentation

## 2014-03-02 DIAGNOSIS — R002 Palpitations: Secondary | ICD-10-CM

## 2014-03-02 DIAGNOSIS — Z87828 Personal history of other (healed) physical injury and trauma: Secondary | ICD-10-CM | POA: Insufficient documentation

## 2014-03-02 DIAGNOSIS — Z8709 Personal history of other diseases of the respiratory system: Secondary | ICD-10-CM | POA: Insufficient documentation

## 2014-03-02 DIAGNOSIS — R0602 Shortness of breath: Secondary | ICD-10-CM

## 2014-03-02 DIAGNOSIS — R0789 Other chest pain: Principal | ICD-10-CM | POA: Insufficient documentation

## 2014-03-02 HISTORY — DX: Other disorders of lung: J98.4

## 2014-03-02 LAB — CBC
HCT: 47.8 % (ref 39.0–52.0)
HEMOGLOBIN: 17.5 g/dL — AB (ref 13.0–17.0)
MCH: 33.8 pg (ref 26.0–34.0)
MCHC: 36.6 g/dL — ABNORMAL HIGH (ref 30.0–36.0)
MCV: 92.5 fL (ref 78.0–100.0)
Platelets: 237 10*3/uL (ref 150–400)
RBC: 5.17 MIL/uL (ref 4.22–5.81)
RDW: 12.9 % (ref 11.5–15.5)
WBC: 11.8 10*3/uL — ABNORMAL HIGH (ref 4.0–10.5)

## 2014-03-02 LAB — CBC WITH DIFFERENTIAL/PLATELET
BASOS ABS: 0.1 10*3/uL (ref 0.0–0.1)
Basophils Relative: 1 % (ref 0–1)
EOS PCT: 5 % (ref 0–5)
Eosinophils Absolute: 0.4 10*3/uL (ref 0.0–0.7)
HEMATOCRIT: 46.9 % (ref 39.0–52.0)
HEMOGLOBIN: 17.1 g/dL — AB (ref 13.0–17.0)
LYMPHS PCT: 15 % (ref 12–46)
Lymphs Abs: 1.1 10*3/uL (ref 0.7–4.0)
MCH: 33.5 pg (ref 26.0–34.0)
MCHC: 36.5 g/dL — ABNORMAL HIGH (ref 30.0–36.0)
MCV: 91.8 fL (ref 78.0–100.0)
MONO ABS: 0.5 10*3/uL (ref 0.1–1.0)
MONOS PCT: 6 % (ref 3–12)
Neutro Abs: 5.6 10*3/uL (ref 1.7–7.7)
Neutrophils Relative %: 73 % (ref 43–77)
Platelets: 209 10*3/uL (ref 150–400)
RBC: 5.11 MIL/uL (ref 4.22–5.81)
RDW: 12.9 % (ref 11.5–15.5)
WBC: 7.6 10*3/uL (ref 4.0–10.5)

## 2014-03-02 LAB — I-STAT CHEM 8, ED
BUN: 6 mg/dL (ref 6–23)
CREATININE: 0.9 mg/dL (ref 0.50–1.35)
Calcium, Ion: 1.18 mmol/L (ref 1.12–1.23)
Chloride: 105 mEq/L (ref 96–112)
GLUCOSE: 112 mg/dL — AB (ref 70–99)
HCT: 49 % (ref 39.0–52.0)
HEMOGLOBIN: 16.7 g/dL (ref 13.0–17.0)
Potassium: 3.7 mEq/L (ref 3.7–5.3)
Sodium: 142 mEq/L (ref 137–147)
TCO2: 21 mmol/L (ref 0–100)

## 2014-03-02 LAB — TROPONIN I: Troponin I: <0.3 ng/mL (ref <0.30)

## 2014-03-02 LAB — CREATININE, SERUM
CREATININE: 0.96 mg/dL (ref 0.50–1.35)
GFR calc Af Amer: >90 mL/min (ref >90)
GFR calc non Af Amer: >90 mL/min (ref >90)

## 2014-03-02 LAB — I-STAT TROPONIN, ED: Troponin i, poc: 0.01 ng/mL (ref 0.00–0.08)

## 2014-03-02 MED ORDER — VANCOMYCIN HCL IN DEXTROSE 1-5 GM/200ML-% IV SOLN: 1000.0000 mg | Freq: Once | INTRAVENOUS | Status: DC

## 2014-03-02 MED ORDER — PIPERACILLIN-TAZOBACTAM 3.375 G IVPB 30 MIN: 3.3750 g | Freq: Once | INTRAVENOUS | Status: DC

## 2014-03-02 MED ORDER — NITROGLYCERIN 0.4 MG SL SUBL: 0.4000 mg | SUBLINGUAL_TABLET | SUBLINGUAL | Status: DC | PRN

## 2014-03-02 MED ORDER — ASPIRIN 81 MG PO CHEW: 324.0000 mg | CHEWABLE_TABLET | ORAL | Status: DC

## 2014-03-02 MED ORDER — ASPIRIN 300 MG RE SUPP: 300.0000 mg | RECTAL | Status: DC

## 2014-03-02 MED ORDER — ASPIRIN EC 81 MG PO TBEC
81.0000 mg | DELAYED_RELEASE_TABLET | Freq: Every day | ORAL | Status: DC
  Filled 2014-03-02: qty 1

## 2014-03-02 MED ORDER — ENOXAPARIN SODIUM 40 MG/0.4ML ~~LOC~~ SOLN
40.0000 mg | Freq: Every day | SUBCUTANEOUS | Status: DC
  Filled 2014-03-02: qty 0.4
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
## 2014-03-03 ENCOUNTER — Observation Stay (HOSPITAL_COMMUNITY): Payer: BC Managed Care – PPO

## 2014-03-03 DIAGNOSIS — R079 Chest pain, unspecified: Secondary | ICD-10-CM

## 2014-03-03 LAB — HEMOGLOBIN A1C
Hgb A1c MFr Bld: 5.5 % (ref <5.7)
MEAN PLASMA GLUCOSE: 111 mg/dL (ref <117)

## 2014-03-03 LAB — TSH: TSH: 2.099 u[IU]/mL (ref 0.350–4.500)

## 2014-03-03 LAB — T4, FREE: FREE T4: 1.26 ng/dL (ref 0.80–1.80)

## 2014-03-03 LAB — LIPID PANEL
Cholesterol: 178 mg/dL (ref 0–200)
HDL: 29 mg/dL — ABNORMAL LOW (ref >39)
LDL Cholesterol: 81 mg/dL (ref 0–99)
Total CHOL/HDL Ratio: 6.1 RATIO
Triglycerides: 340 mg/dL — ABNORMAL HIGH (ref <150)
VLDL: 68 mg/dL — AB (ref 0–40)

## 2014-03-03 LAB — TROPONIN I
Troponin I: <0.3 ng/mL (ref <0.30)
Troponin I: <0.3 ng/mL (ref <0.30)

## 2014-03-03 MED ORDER — TECHNETIUM TC 99M SESTAMIBI GENERIC - CARDIOLITE
10.0000 | Freq: Once | INTRAVENOUS | Status: AC | PRN
  Administered 2014-03-03: 10 via INTRAVENOUS

## 2014-03-03 MED ORDER — ASPIRIN 81 MG PO TBEC: 81.0000 mg | DELAYED_RELEASE_TABLET | Freq: Every day | ORAL | Status: DC

## 2014-03-03 MED ORDER — TECHNETIUM TC 99M SESTAMIBI GENERIC - CARDIOLITE
30.0000 | Freq: Once | INTRAVENOUS | Status: AC | PRN
  Administered 2014-03-03: 30 via INTRAVENOUS

## 2014-03-03 NOTE — Discharge Instructions (Signed)
Chest Pain (Nonspecific) °Chest pain has many causes. Your pain could be caused by something serious, such as a heart attack or a blood clot in the lungs. It could also be caused by something less serious, such as a chest bruise or a virus. Follow up with your doctor. More lab tests or other studies may be needed to find the cause of your pain. Most of the time, nonspecific chest pain will improve within 2 to 3 days of rest and mild pain medicine. °HOME CARE °· For chest bruises, you may put ice on the sore area for 15-20 minutes, 03-04 times a day. Do this only if it makes you feel better. °· Put ice in a plastic bag. °· Place a towel between the skin and the bag. °· Rest for the next 2 to 3 days. °· Go back to work if the pain improves. °· See your doctor if the pain lasts longer than 1 to 2 weeks. °· Only take medicine as told by your doctor. °· Quit smoking if you smoke. °GET HELP RIGHT AWAY IF:  °· There is more pain or pain that spreads to the arm, neck, jaw, back, or belly (abdomen). °· You have shortness of breath. °· You cough more than usual or cough up blood. °· You have very bad back or belly pain, feel sick to your stomach (nauseous), or throw up (vomit). °· You have very bad weakness. °· You pass out (faint). °· You have a fever. °Any of these problems may be serious and may be an emergency. Do not wait to see if the problems will go away. Get medical help right away. Call your local emergency services 911 in [place] Do not drive yourself to the hospital. °MAKE SURE YOU:  °· Understand these instructions. °· Will watch this condition. °· Will get help right away if you or your child is not doing well or gets worse. °Document Released: 05/16/2008 Document Revised: 02/20/2012 Document Reviewed: 05/16/2008 °ExitCare® Patient Information ©2014 [employer]. ° °

## 2014-03-03 NOTE — Progress Notes (Signed)
Patient ID: Douglas Chandler, male   DOB: 23-Apr-1956, 58 y.o.   MRN: [id number]    Subjective:  Denies SSCP, palpitations or Dyspnea Wore sleep pap last night  Objective:  Filed Vitals:   03/02/14 1716 03/02/14 2003 03/02/14 2041 03/03/14 0607  BP:  119/67 151/93 125/77  Pulse: 85 87 78 78  Temp:   98.1 F (36.7 C) 98.5 F (36.9 C)  TempSrc:   Oral Oral  Resp: 17 17 16 18   Height:  6\' 3"  (1.905 m)    Weight:  282 lb 8 oz (128.141 kg)    SpO2: 96% 95% 97% 95%    Intake/Output from previous day:  Intake/Output Summary (Last 24 hours) at 03/03/14 6834 Last data filed at 03/02/14 2300  Gross per 24 hour  Intake    360 ml  Output      0 ml  Net    360 ml    Physical Exam: Affect appropriate Overweight white male  HEENT: normal Neck supple with no adenopathy JVP normal no bruits no thyromegaly Lungs clear with no wheezing and good diaphragmatic motion Heart:  S1/S2 no murmur, no rub, gallop or click PMI normal Abdomen: benighn, BS positve, no tenderness, no AAA no bruit.  No HSM or HJR Distal pulses intact with no bruits No edema Neuro non-focal Skin warm and dry No muscular weakness   Lab Results: Basic Metabolic Panel:  Recent Labs  03/02/14 1637 03/02/14 2134  NA 142  --   K 3.7  --   CL 105  --   GLUCOSE 112*  --   BUN 6  --   CREATININE 0.90 0.96   Liver Function Tests: No results found for this basename: AST, ALT, ALKPHOS, BILITOT, PROT, ALBUMIN,  in the last 72 hours No results found for this basename: LIPASE, AMYLASE,  in the last 72 hours CBC:  Recent Labs  03/02/14 1626 03/02/14 1637 03/02/14 2134  WBC 7.6  --  11.8*  NEUTROABS 5.6  --   --   HGB 17.1* 16.7 17.5*  HCT 46.9 49.0 47.8  MCV 91.8  --  92.5  PLT 209  --  237   Cardiac Enzymes:  Recent Labs  03/02/14 2134 03/03/14 0311  TROPONINI <0.30 <0.30   Fasting Lipid Panel:  Recent Labs  03/03/14 0311  CHOL 178  HDL 29*  LDLCALC 81  TRIG 340*  CHOLHDL 6.1     Imaging: Dg Chest 2 View  03/02/2014   CLINICAL DATA:  Weakness, lightheadedness, palpitations, history of inhalational lung disease  EXAM: CHEST  2 VIEW  COMPARISON:  05/15/2012  FINDINGS: Normal heart size and pulmonary vascularity.  Tortuous thoracic aorta.  Lungs clear.  Improved basilar aeration versus previous exam.  No acute infiltrate, pleural effusion or pneumothorax.  Bones unremarkable.  IMPRESSION: No acute abnormalities.   Electronically Signed   By: Lavonia Dana M.D.   On: 03/02/2014 16:59    Cardiac Studies:  ECG:    Telemetry:SR rate 72 no PAF or VT   Echo:   Medications:   . aspirin  324 mg Oral NOW   Or  . aspirin  300 mg Rectal NOW  . aspirin EC  81 mg Oral Daily  . enoxaparin (LOVENOX) injection  40 mg Subcutaneous Daily       Assessment/Plan:  Chest Pain:  Resolved normal nonspecfic T wave changes Obese.  ASA  Stress myovue this am   Sleep Apnea continue CPAP  F/u [facility] Obesity;  Discussed low carb diet  Can go home if myovue is normal and f/u Dr Valinda Hoar [facility] 03/03/2014, 8:03 AM
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
## 2014-03-03 NOTE — Progress Notes (Signed)
Patient ID: Douglas Chandler, male   DOB: 1956/03/19, 58 y.o.   MRN: [id number]  Reviewed Myovue Normal ECG Normal nuclear images ( Reviewed with Dr Ardeen Garland who will do official read by [facility]) EF 61%  D/C home F/U with me next available   [name]

## 2014-03-03 NOTE — Progress Notes (Signed)
Reviewed discharge instructions with patient and he stated his understanding.  Spoke with Ellen Henri PA and she stated patient did not have to wait on 2decho to be completed prior to discharge.  Discharged home with family.  Douglas Chandler

## 2014-03-10 ENCOUNTER — Telehealth: Payer: Self-pay | Admitting: Cardiovascular Disease

## 2014-03-10 DIAGNOSIS — I498 Other specified cardiac arrhythmias: Secondary | ICD-10-CM

## 2014-03-10 NOTE — Telephone Encounter (Signed)
SPOKE WITH  PT  EARLIER  TODAY  RE MESSAGE PER  PT   HAD  1  EPISODE ON FRI  OF HEART  FLUTTERING SENSATION  ALSO   NOTED   ANOTHER EPISODE ON  Sunday  THAT LASTED LONGER    IS  VERY SYMPTOMATIC  HAS   APPT ON  03-17-14  WITH DR Johnsie Cancel   ENCOURAGED   PT   TO DECREASE  CAFFEINE  INTAKE  TO  SEE  IF  THIS  WOULD  HELP WILL FORWARD  MESSAGE   TO  DR Johnsie Cancel  FOR  REVIEW./CY

## 2014-03-10 NOTE — Telephone Encounter (Signed)
Pt notifIed./CY

## 2014-03-10 NOTE — Telephone Encounter (Signed)
New message  Pt called states that he has had numerous attacks over the weekend/ a lot of weekness, dizziness very hard to breathe and tightness of chest// Please call for same day appt with [facility]

## 2014-03-10 NOTE — Telephone Encounter (Signed)
See if we can get him an event monitor before office visit

## 2014-03-10 NOTE — Telephone Encounter (Signed)
Follow up     Pt want nurse to call him on his cell phone---did not give it to the secretary earlier

## 2014-03-12 ENCOUNTER — Encounter (INDEPENDENT_AMBULATORY_CARE_PROVIDER_SITE_OTHER): Payer: BC Managed Care – PPO

## 2014-03-12 ENCOUNTER — Encounter: Payer: Self-pay | Admitting: *Deleted

## 2014-03-12 DIAGNOSIS — R42 Dizziness and giddiness: Secondary | ICD-10-CM

## 2014-03-12 DIAGNOSIS — I4902 Ventricular flutter: Secondary | ICD-10-CM

## 2014-03-12 DIAGNOSIS — R002 Palpitations: Secondary | ICD-10-CM

## 2014-03-12 DIAGNOSIS — I498 Other specified cardiac arrhythmias: Secondary | ICD-10-CM

## 2014-03-12 NOTE — Progress Notes (Signed)
Patient ID: Douglas Chandler, male   DOB: 03-21-1956, 58 y.o.   MRN: [id number] E-Cardio verite 30 day cardiac event monitor applied to patient.

## 2014-03-17 ENCOUNTER — Ambulatory Visit (INDEPENDENT_AMBULATORY_CARE_PROVIDER_SITE_OTHER): Payer: BC Managed Care – PPO | Admitting: Cardiovascular Disease

## 2014-03-17 ENCOUNTER — Encounter: Payer: Self-pay | Admitting: Cardiovascular Disease

## 2014-03-17 VITALS — BP 150/95 | HR 101 | Ht 75.0 in | Wt 286.0 lb

## 2014-03-17 DIAGNOSIS — R002 Palpitations: Secondary | ICD-10-CM

## 2014-03-17 DIAGNOSIS — I10 Essential (primary) hypertension: Secondary | ICD-10-CM | POA: Insufficient documentation

## 2014-03-17 MED ORDER — LOSARTAN POTASSIUM 50 MG PO TABS: 50.0000 mg | ORAL_TABLET | Freq: Every day | ORAL | Status: DC

## 2014-03-17 MED ORDER — METOPROLOL TARTRATE 25 MG PO TABS: 25.0000 mg | ORAL_TABLET | Freq: Two times a day (BID) | ORAL | Status: DC

## 2014-03-17 NOTE — Assessment & Plan Note (Signed)
Start ARB in addition to beta blocker for palpitations  I think when BP and HR better controlled he will feel better and risk of PAF will be lower

## 2014-03-17 NOTE — Assessment & Plan Note (Signed)
Seems to have mostly lots of PAC;s  Start lopressor 25 bid and titrate dose.  Continue event monitor to r/o persistent periods of PAF which may require drug like flecainide
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
## 2014-03-17 NOTE — Patient Instructions (Addendum)
Your physician recommends that you schedule a follow-up appointment in: NEXT  AVAILABLE WITH  DR [facility] Your physician has recommended you make the following change in your medication:  START  LOSARTAN  50 MG  EVERY DAY  AND   LOPRESSOR   25 MG   TWICE  A  DAY

## 2014-04-21 ENCOUNTER — Telehealth: Payer: Self-pay | Admitting: *Deleted

## 2014-04-21 ENCOUNTER — Ambulatory Visit (INDEPENDENT_AMBULATORY_CARE_PROVIDER_SITE_OTHER): Payer: BC Managed Care – PPO | Admitting: Cardiovascular Disease

## 2014-04-21 ENCOUNTER — Encounter: Payer: Self-pay | Admitting: Cardiovascular Disease

## 2014-04-21 VITALS — BP 134/88 | HR 70 | Ht 75.5 in | Wt 291.1 lb

## 2014-04-21 DIAGNOSIS — I10 Essential (primary) hypertension: Secondary | ICD-10-CM

## 2014-04-21 DIAGNOSIS — R002 Palpitations: Secondary | ICD-10-CM

## 2014-04-21 DIAGNOSIS — R079 Chest pain, unspecified: Secondary | ICD-10-CM
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
## 2014-04-21 NOTE — Assessment & Plan Note (Signed)
Improved with beta blocker Event monitor with short burst PSVT/PACls  No afib  Improved

## 2014-04-21 NOTE — Telephone Encounter (Signed)
PT  AWARE OF MONITOR  RESULTS  AT  OFFICE  VISIT  TODAY ./CY

## 2014-04-21 NOTE — Assessment & Plan Note (Signed)
Improved with ARB  Discussed diet and salt intake

## 2014-04-21 NOTE — Assessment & Plan Note (Signed)
R/O normal ECG normal ETT stable no evidence of CAD

## 2014-04-21 NOTE — Patient Instructions (Signed)
Your physician wants you to follow-up in:  6 MONTHS WITH DR NISHAN  You will receive a reminder letter in the mail two months in advance. If you don't receive a letter, please call our office to schedule the follow-up appointment. Your physician recommends that you continue on your current medications as directed. Please refer to the Current Medication list given to you today. 
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
## 2014-10-21 ENCOUNTER — Ambulatory Visit (INDEPENDENT_AMBULATORY_CARE_PROVIDER_SITE_OTHER): Payer: BC Managed Care – PPO | Admitting: Cardiovascular Disease

## 2014-10-21 ENCOUNTER — Encounter: Payer: Self-pay | Admitting: Cardiovascular Disease

## 2014-10-21 VITALS — BP 110/72 | HR 78 | Ht 75.0 in | Wt 281.4 lb

## 2014-10-21 DIAGNOSIS — R002 Palpitations: Secondary | ICD-10-CM

## 2014-10-21 DIAGNOSIS — I10 Essential (primary) hypertension: Secondary | ICD-10-CM

## 2014-10-21 DIAGNOSIS — L719 Rosacea, unspecified: Secondary | ICD-10-CM

## 2014-10-21 MED ORDER — DOXYCYCLINE 40 MG PO CPDR: 40.0000 mg | DELAYED_RELEASE_CAPSULE | ORAL | Status: DC

## 2014-10-21 NOTE — Assessment & Plan Note (Signed)
Needs to f/u with Dr Jeannett Senior in [place] 40 mg for him in mean time

## 2014-10-21 NOTE — Assessment & Plan Note (Signed)
Well controlled.  Continue current medications and low sodium Dash type diet.    

## 2014-10-21 NOTE — Patient Instructions (Signed)
Your physician wants you to follow-up in:  [place] will receive a reminder letter in the mail two months in advance. If you don't receive a letter, please call our office to schedule the follow-up appointment. Your physician has recommended you make the following change in your medication:

## 2014-10-21 NOTE — Assessment & Plan Note (Signed)
Benign continue beta blocker stable

## 2014-11-12 ENCOUNTER — Telehealth: Payer: Self-pay | Admitting: *Deleted

## 2014-11-12 NOTE — Telephone Encounter (Signed)
AWAITING  DR  Mariana Arn RECOMMENDATIONS .Douglas Chandler

## 2014-11-12 NOTE — Telephone Encounter (Signed)
PA for Oracea denied, requires other medications to be tried first.  Information sent to Dr. Johnsie Cancel.

## 2014-11-12 NOTE — Telephone Encounter (Signed)
Discuss with Dr Sherren Mocha or current primary  what med to put him on for rosacea instead

## 2014-11-13 ENCOUNTER — Telehealth: Payer: Self-pay | Admitting: *Deleted

## 2014-11-13 ENCOUNTER — Telehealth: Payer: Self-pay | Admitting: Family Medicine

## 2014-11-13 NOTE — Telephone Encounter (Signed)
MESSAGE FORWARDED TO PRIOR  AUTH  NURSE./CY

## 2014-11-13 NOTE — Telephone Encounter (Signed)
Let patient know that PA for Oracea had been denied and that he needed to call [facility] at [facility] to make an appointment with his PCP to get a new prescription.

## 2014-11-13 NOTE — Telephone Encounter (Signed)
Dr. Honor Junes office called, information about medications faxed to his office.

## 2014-11-13 NOTE — Telephone Encounter (Signed)
error 

## 2014-11-17 ENCOUNTER — Encounter: Payer: Self-pay | Admitting: Family Medicine

## 2014-11-17 ENCOUNTER — Ambulatory Visit (INDEPENDENT_AMBULATORY_CARE_PROVIDER_SITE_OTHER): Payer: BC Managed Care – PPO | Admitting: Family Medicine

## 2014-11-17 VITALS — BP 120/80 | Temp 97.8°F | Wt 287.0 lb

## 2014-11-17 DIAGNOSIS — L719 Rosacea, unspecified: Secondary | ICD-10-CM

## 2014-11-17 DIAGNOSIS — I10 Essential (primary) hypertension: Secondary | ICD-10-CM

## 2014-11-17 MED ORDER — LOSARTAN POTASSIUM 50 MG PO TABS: 50.0000 mg | ORAL_TABLET | Freq: Every day | ORAL | Status: DC

## 2014-11-17 MED ORDER — DOXYCYCLINE HYCLATE 100 MG PO TABS: 100.0000 mg | ORAL_TABLET | Freq: Two times a day (BID) | ORAL | Status: DC

## 2014-11-17 MED ORDER — METOPROLOL TARTRATE 25 MG PO TABS: 25.0000 mg | ORAL_TABLET | Freq: Two times a day (BID) | ORAL | Status: DC

## 2014-11-17 MED ORDER — METRONIDAZOLE 1 % EX GEL: Freq: Every day | CUTANEOUS | Status: DC

## 2014-11-17 NOTE — Progress Notes (Signed)
Pre visit review using our clinic review tool, if applicable. No additional management support is needed unless otherwise documented below in the visit note. 

## 2014-11-17 NOTE — Progress Notes (Signed)
   Subjective:    Patient ID: Douglas Chandler, male    DOB: September 22, 1956, 58 y.o.   MRN: [id number]  HPI Douglas Chandler is a 58 year old married male nonsmoker who comes in today to discuss rosacea  He has a history of underlying hypertension and is on Cozaar 50 mg daily and Lopressor 25 twice a day. His medications a been refilled by Dr. Roosvelt Harps  He's not had a physical examination in over 3 or 4 years.  He takes a long-acting tetracycline product 40 mg daily and it doesn't seem to be helping his rosacea now. Not a big alcohol drinker [name] avoid spicy and foods review of systems negative except he had not a physical exam in a couple years   Review of Systems     Objective:   Physical Exam  Well-developed well-nourished male no acute distress vital signs stable he is afebrile examination skin shows classic skin changes consistent with rosacea      Assessment & Plan:  Rosacea,,,,,,,,,, change medication to minocycline 100 twice a day add MetroGel  Hypertension,,,,,,, at goal,,,,, BP 120/80,,,,,,, refill medication,,, return for CPX

## 2014-11-17 NOTE — Patient Instructions (Signed)
Doxycyclin............. one twice daily  MetroGel............. apply bedtime wash off in the morning  Lopressor 25 milligrams.......... one twice daily........ dropped down to 1 a day since she doing well  Cozaar 50 mg..........Marland Kitchen 1 daily in the morning  Return sometime in January for a physical examination............ fasting labs one week prior

## 2014-11-18 ENCOUNTER — Telehealth: Payer: Self-pay | Admitting: Family Medicine

## 2014-11-18 NOTE — Telephone Encounter (Signed)
emmi mailed  °

## 2014-12-25 ENCOUNTER — Other Ambulatory Visit: Payer: Self-pay

## 2014-12-26 ENCOUNTER — Other Ambulatory Visit: Payer: Self-pay

## 2014-12-29 ENCOUNTER — Other Ambulatory Visit (INDEPENDENT_AMBULATORY_CARE_PROVIDER_SITE_OTHER): Payer: BLUE CROSS/BLUE SHIELD

## 2014-12-29 DIAGNOSIS — L719 Rosacea, unspecified: Secondary | ICD-10-CM

## 2014-12-29 DIAGNOSIS — I10 Essential (primary) hypertension: Secondary | ICD-10-CM

## 2014-12-29 LAB — LIPID PANEL
CHOL/HDL RATIO: 4
Cholesterol: 163 mg/dL (ref 0–200)
HDL: 36.7 mg/dL — ABNORMAL LOW (ref >39.00)
LDL Cholesterol: 89 mg/dL (ref 0–99)
NonHDL: 126.3
Triglycerides: 185 mg/dL — ABNORMAL HIGH (ref 0.0–149.0)
VLDL: 37 mg/dL (ref 0.0–40.0)

## 2014-12-29 LAB — CBC WITH DIFFERENTIAL/PLATELET
BASOS ABS: 0.1 10*3/uL (ref 0.0–0.1)
Basophils Relative: 0.7 % (ref 0.0–3.0)
EOS ABS: 0.4 10*3/uL (ref 0.0–0.7)
Eosinophils Relative: 4.6 % (ref 0.0–5.0)
HCT: 49.3 % (ref 39.0–52.0)
HEMOGLOBIN: 16.7 g/dL (ref 13.0–17.0)
Lymphocytes Relative: 27 % (ref 12.0–46.0)
Lymphs Abs: 2.2 10*3/uL (ref 0.7–4.0)
MCHC: 33.8 g/dL (ref 30.0–36.0)
MCV: 95.3 fl (ref 78.0–100.0)
MONO ABS: 0.6 10*3/uL (ref 0.1–1.0)
Monocytes Relative: 7.6 % (ref 3.0–12.0)
NEUTROS PCT: 60.1 % (ref 43.0–77.0)
Neutro Abs: 4.8 10*3/uL (ref 1.4–7.7)
PLATELETS: 212 10*3/uL (ref 150.0–400.0)
RBC: 5.18 Mil/uL (ref 4.22–5.81)
RDW: 13.1 % (ref 11.5–15.5)
WBC: 8.1 10*3/uL (ref 4.0–10.5)

## 2014-12-29 LAB — POCT URINALYSIS DIPSTICK
BILIRUBIN UA: NEGATIVE
Blood, UA: NEGATIVE
GLUCOSE UA: NEGATIVE
KETONES UA: NEGATIVE
LEUKOCYTES UA: NEGATIVE
Nitrite, UA: NEGATIVE
Protein, UA: NEGATIVE
Spec Grav, UA: 1.015
UROBILINOGEN UA: 0.2
pH, UA: 6

## 2014-12-29 LAB — HEPATIC FUNCTION PANEL
ALT: 42 U/L (ref 0–53)
AST: 23 U/L (ref 0–37)
Albumin: 4.1 g/dL (ref 3.5–5.2)
Alkaline Phosphatase: 43 U/L (ref 39–117)
BILIRUBIN DIRECT: 0.2 mg/dL (ref 0.0–0.3)
Total Bilirubin: 0.8 mg/dL (ref 0.2–1.2)
Total Protein: 6.9 g/dL (ref 6.0–8.3)

## 2014-12-29 LAB — BASIC METABOLIC PANEL
BUN: 10 mg/dL (ref 6–23)
CO2: 28 mEq/L (ref 19–32)
Calcium: 9.5 mg/dL (ref 8.4–10.5)
Chloride: 105 mEq/L (ref 96–112)
Creatinine, Ser: 0.86 mg/dL (ref 0.40–1.50)
GFR: 97.75 mL/min (ref >60.00)
GLUCOSE: 88 mg/dL (ref 70–99)
POTASSIUM: 4.5 meq/L (ref 3.5–5.1)
Sodium: 139 mEq/L (ref 135–145)

## 2014-12-29 LAB — PSA: PSA: 0.24 ng/mL (ref 0.10–4.00)

## 2014-12-29 LAB — TSH: TSH: 1.11 u[IU]/mL (ref 0.35–4.50)

## 2015-01-01 ENCOUNTER — Encounter: Payer: Self-pay | Admitting: Family Medicine

## 2015-01-08 ENCOUNTER — Ambulatory Visit: Payer: Self-pay

## 2015-01-08 ENCOUNTER — Other Ambulatory Visit: Payer: Self-pay | Admitting: Occupational Medicine

## 2015-01-08 DIAGNOSIS — R52 Pain, unspecified: Secondary | ICD-10-CM

## 2015-03-12 ENCOUNTER — Telehealth: Payer: Self-pay | Admitting: Family Medicine

## 2015-03-12 NOTE — Telephone Encounter (Signed)
Okay to substitute minocycline 50 mg twice daily #60

## 2015-03-12 NOTE — Telephone Encounter (Signed)
Pt called to say that he contacted his insurance comp for something cheaper for the following med  doxycycline (VIBRA-TABS) 100 MG tablet   They told him of the following that was cheaper    doxycycline mono, minocycline  And he is also asking maybe Dr Sherren Mocha know of something else that is cheaper for his ROSACEA

## 2015-03-13 MED ORDER — MINOCYCLINE HCL 50 MG PO TABS: 50.0000 mg | ORAL_TABLET | Freq: Two times a day (BID) | ORAL | Status: DC

## 2015-03-13 NOTE — Telephone Encounter (Signed)
Spoke to pt told him Dr.K is going to switch him to Minocycline 50 mg twice a day. Pt verbalized understanding and asked to send to [facility]. Told pt okay. Rx sent.

## 2015-11-12 ENCOUNTER — Encounter: Payer: Self-pay | Admitting: *Deleted
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
## 2015-11-17 ENCOUNTER — Encounter: Payer: Self-pay | Admitting: Cardiovascular Disease

## 2015-11-18 ENCOUNTER — Encounter: Payer: BLUE CROSS/BLUE SHIELD | Admitting: Cardiovascular Disease

## 2015-11-18 DIAGNOSIS — R0989 Other specified symptoms and signs involving the circulatory and respiratory systems: Secondary | ICD-10-CM

## 2015-11-19 ENCOUNTER — Encounter: Payer: Self-pay | Admitting: Cardiovascular Disease

## 2017-02-28 ENCOUNTER — Encounter: Payer: Self-pay | Admitting: Gastroenterology

## 2017-02-28 ENCOUNTER — Ambulatory Visit (INDEPENDENT_AMBULATORY_CARE_PROVIDER_SITE_OTHER): Payer: BLUE CROSS/BLUE SHIELD | Admitting: Adult Health

## 2017-02-28 ENCOUNTER — Encounter: Payer: Self-pay | Admitting: Adult Health

## 2017-02-28 VITALS — BP 152/84 | Temp 98.1°F | Ht 75.0 in | Wt 303.2 lb

## 2017-02-28 DIAGNOSIS — I10 Essential (primary) hypertension: Secondary | ICD-10-CM

## 2017-02-28 DIAGNOSIS — Z23 Encounter for immunization: Secondary | ICD-10-CM | POA: Diagnosis not present

## 2017-02-28 DIAGNOSIS — Z7689 Persons encountering health services in other specified circumstances: Secondary | ICD-10-CM | POA: Diagnosis not present

## 2017-02-28 DIAGNOSIS — Z1211 Encounter for screening for malignant neoplasm of colon: Secondary | ICD-10-CM

## 2017-02-28 MED ORDER — METOPROLOL TARTRATE 25 MG PO TABS: 25.0000 mg | ORAL_TABLET | Freq: Two times a day (BID) | ORAL | 3 refills | Status: DC
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
## 2017-02-28 NOTE — Patient Instructions (Signed)
It was a pleasure meeting you today   I am going to send in the prescription for Metoprolol 25 mg. Take this twice a day. We will see how your blood pressure responds to this.   Work on diet and exercise  Follow up for your physical

## 2017-04-07 ENCOUNTER — Ambulatory Visit (INDEPENDENT_AMBULATORY_CARE_PROVIDER_SITE_OTHER): Payer: BLUE CROSS/BLUE SHIELD | Admitting: Adult Health

## 2017-04-07 ENCOUNTER — Encounter: Payer: Self-pay | Admitting: Adult Health

## 2017-04-07 VITALS — BP 142/82 | Temp 98.3°F | Ht 75.0 in | Wt 293.4 lb

## 2017-04-07 DIAGNOSIS — I10 Essential (primary) hypertension: Secondary | ICD-10-CM | POA: Diagnosis not present

## 2017-04-07 DIAGNOSIS — Z760 Encounter for issue of repeat prescription: Secondary | ICD-10-CM | POA: Diagnosis not present

## 2017-04-07 DIAGNOSIS — Z0000 Encounter for general adult medical examination without abnormal findings: Secondary | ICD-10-CM

## 2017-04-07 DIAGNOSIS — Z1159 Encounter for screening for other viral diseases: Secondary | ICD-10-CM | POA: Diagnosis not present

## 2017-04-07 LAB — CBC WITH DIFFERENTIAL/PLATELET
Basophils Absolute: 0.1 10*3/uL (ref 0.0–0.1)
Basophils Relative: 0.9 % (ref 0.0–3.0)
EOS ABS: 0.2 10*3/uL (ref 0.0–0.7)
Eosinophils Relative: 3.2 % (ref 0.0–5.0)
HEMATOCRIT: 49.8 % (ref 39.0–52.0)
HEMOGLOBIN: 17.3 g/dL — AB (ref 13.0–17.0)
LYMPHS PCT: 24.7 % (ref 12.0–46.0)
Lymphs Abs: 1.6 10*3/uL (ref 0.7–4.0)
MCHC: 34.8 g/dL (ref 30.0–36.0)
MCV: 96 fl (ref 78.0–100.0)
Monocytes Absolute: 0.6 10*3/uL (ref 0.1–1.0)
Monocytes Relative: 8.8 % (ref 3.0–12.0)
Neutro Abs: 4.1 10*3/uL (ref 1.4–7.7)
Neutrophils Relative %: 62.4 % (ref 43.0–77.0)
Platelets: 193 10*3/uL (ref 150.0–400.0)
RBC: 5.19 Mil/uL (ref 4.22–5.81)
RDW: 13.4 % (ref 11.5–15.5)
WBC: 6.6 10*3/uL (ref 4.0–10.5)

## 2017-04-07 LAB — LIPID PANEL
CHOL/HDL RATIO: 5
Cholesterol: 187 mg/dL (ref 0–200)
HDL: 35.9 mg/dL — ABNORMAL LOW (ref >39.00)
NonHDL: 151.23
Triglycerides: 242 mg/dL — ABNORMAL HIGH (ref 0.0–149.0)
VLDL: 48.4 mg/dL — AB (ref 0.0–40.0)

## 2017-04-07 LAB — PSA: PSA: 0.21 ng/mL (ref 0.10–4.00)

## 2017-04-07 LAB — BASIC METABOLIC PANEL
BUN: 8 mg/dL (ref 6–23)
CO2: 27 mEq/L (ref 19–32)
Calcium: 9.7 mg/dL (ref 8.4–10.5)
Chloride: 104 mEq/L (ref 96–112)
Creatinine, Ser: 0.81 mg/dL (ref 0.40–1.50)
GFR: 103.9 mL/min (ref >60.00)
Glucose, Bld: 114 mg/dL — ABNORMAL HIGH (ref 70–99)
POTASSIUM: 3.9 meq/L (ref 3.5–5.1)
Sodium: 140 mEq/L (ref 135–145)

## 2017-04-07 LAB — HEMOGLOBIN A1C: HEMOGLOBIN A1C: 5.9 % (ref 4.6–6.5)

## 2017-04-07 LAB — HEPATIC FUNCTION PANEL
ALK PHOS: 47 U/L (ref 39–117)
ALT: 67 U/L — ABNORMAL HIGH (ref 0–53)
AST: 42 U/L — AB (ref 0–37)
Albumin: 4.4 g/dL (ref 3.5–5.2)
BILIRUBIN TOTAL: 1.1 mg/dL (ref 0.2–1.2)
Bilirubin, Direct: 0.2 mg/dL (ref 0.0–0.3)
Total Protein: 7.4 g/dL (ref 6.0–8.3)

## 2017-04-07 LAB — LDL CHOLESTEROL, DIRECT: Direct LDL: 120 mg/dL

## 2017-04-07 LAB — TSH: TSH: 1.98 u[IU]/mL (ref 0.35–4.50)

## 2017-04-07 MED ORDER — LOSARTAN POTASSIUM 50 MG PO TABS: 50.0000 mg | ORAL_TABLET | Freq: Every day | ORAL | 3 refills | Status: DC

## 2017-04-07 MED ORDER — METOPROLOL TARTRATE 50 MG PO TABS: 50.0000 mg | ORAL_TABLET | Freq: Two times a day (BID) | ORAL | 3 refills | Status: DC

## 2017-04-07 MED ORDER — MINOCYCLINE HCL 50 MG PO TABS: 50.0000 mg | ORAL_TABLET | Freq: Two times a day (BID) | ORAL | 5 refills | Status: DC
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
## 2017-04-08 LAB — HEPATITIS C ANTIBODY: HCV AB: NEGATIVE

## 2017-04-11 ENCOUNTER — Other Ambulatory Visit: Payer: Self-pay | Admitting: Adult Health

## 2017-04-11 ENCOUNTER — Telehealth: Payer: Self-pay | Admitting: Adult Health

## 2017-04-11 DIAGNOSIS — D126 Benign neoplasm of colon, unspecified: Secondary | ICD-10-CM

## 2017-04-11 HISTORY — DX: Benign neoplasm of colon, unspecified: D12.6

## 2017-04-11 MED ORDER — SIMVASTATIN 20 MG PO TABS: 20.0000 mg | ORAL_TABLET | Freq: Every day | ORAL | 3 refills | Status: DC

## 2017-04-11 NOTE — Telephone Encounter (Signed)
° ° ° °  Pt call and would like to know what his cholesterol numbers were.

## 2017-04-11 NOTE — Telephone Encounter (Signed)
Patient notified of lab results and verbalized understanding.  ° °

## 2017-04-24 ENCOUNTER — Encounter: Payer: Self-pay | Admitting: Gastroenterology

## 2017-04-24 ENCOUNTER — Ambulatory Visit (AMBULATORY_SURGERY_CENTER): Payer: Self-pay

## 2017-04-24 VITALS — Ht 75.0 in | Wt 299.6 lb

## 2017-04-24 DIAGNOSIS — Z1211 Encounter for screening for malignant neoplasm of colon: Secondary | ICD-10-CM

## 2017-04-24 MED ORDER — NA SULFATE-K SULFATE-MG SULF 17.5-3.13-1.6 GM/177ML PO SOLN: ORAL | 0 refills | Status: DC

## 2017-04-24 NOTE — Progress Notes (Signed)
Per pt, no allergies to soy or egg products.Pt not taking any weight loss meds or using  O2 at home.   Pt will call back with e-mail address and emmi video will be sent to email then.

## 2017-05-01 ENCOUNTER — Encounter: Payer: Self-pay | Admitting: Gastroenterology

## 2017-05-01 ENCOUNTER — Ambulatory Visit (AMBULATORY_SURGERY_CENTER): Payer: BLUE CROSS/BLUE SHIELD | Admitting: Gastroenterology

## 2017-05-01 VITALS — BP 117/72 | HR 64 | Temp 98.6°F | Resp 12 | Ht 75.0 in | Wt 299.0 lb

## 2017-05-01 DIAGNOSIS — Z1211 Encounter for screening for malignant neoplasm of colon: Secondary | ICD-10-CM

## 2017-05-01 DIAGNOSIS — D123 Benign neoplasm of transverse colon: Secondary | ICD-10-CM

## 2017-05-01 DIAGNOSIS — Z1212 Encounter for screening for malignant neoplasm of rectum: Secondary | ICD-10-CM

## 2017-05-01 DIAGNOSIS — D124 Benign neoplasm of descending colon: Secondary | ICD-10-CM | POA: Diagnosis not present

## 2017-05-01 MED ORDER — SODIUM CHLORIDE 0.9 % IV SOLN: 500.0000 mL | INTRAVENOUS | Status: DC

## 2017-05-01 NOTE — Progress Notes (Signed)
Report given to PACU, vss 

## 2017-05-01 NOTE — Progress Notes (Signed)
Called to room to assist during endoscopic procedure.  Patient ID and intended procedure confirmed with present staff. Received instructions for my participation in the procedure from the performing physician.  

## 2017-05-01 NOTE — Progress Notes (Signed)
No changes in medical or surgical hx since PV 

## 2017-05-01 NOTE — Op Note (Signed)
[facility] Patient Name: Douglas Chandler Procedure Date: 05/01/2017 7:59 AM MRN: [id number] Endoscopist: Ladene Artist , MD Age: 61 Referring MD:  Date of Birth: [birth year] Gender: Male Account #: [id number] Procedure:                Colonoscopy Indications:              Screening for colorectal malignant neoplasm Medicines:                Monitored Anesthesia Care Procedure:                Pre-Anesthesia Assessment:                           - Prior to the procedure, a History and Physical                            was performed, and patient medications and                            allergies were reviewed. The patient's tolerance of                            previous anesthesia was also reviewed. The risks                            and benefits of the procedure and the sedation                            options and risks were discussed with the patient.                            All questions were answered, and informed consent                            was obtained. Prior Anticoagulants: The patient has                            taken no previous anticoagulant or antiplatelet                            agents. ASA Grade Assessment: II - A patient with                            mild systemic disease. After reviewing the risks                            and benefits, the patient was deemed in                            satisfactory condition to undergo the procedure.                           After obtaining informed consent, the colonoscope  was passed under direct vision. Throughout the                            procedure, the patient's blood pressure, pulse, and                            oxygen saturations were monitored continuously. The                            Model PCF-H190DL [id number]) scope was introduced                            through the anus and advanced to the the cecum,                            identified by  appendiceal orifice and ileocecal                            valve. The ileocecal valve, appendiceal orifice,                            and rectum were photographed. The quality of the                            bowel preparation was good. The colonoscopy was                            performed without difficulty. The patient tolerated                            the procedure well. Scope In: 8:03:01 AM Scope Out: 8:17:45 AM Scope Withdrawal Time: 0 hours 13 minutes 14 seconds  Total Procedure Duration: 0 hours 14 minutes 44 seconds  Findings:                 The perianal and digital rectal examinations were                            normal.                           Four sessile polyps were found in the descending                            colon (1) and transverse colon (4). The polyps were                            4 to 7 mm in size. These polyps were removed with a                            cold snare. Resection and retrieval were complete.                           The exam was otherwise without abnormality on  direct and retroflexion views. Complications:            No immediate complications. Estimated blood loss:                            None. Estimated Blood Loss:     Estimated blood loss: none. Impression:               - Four 4 to 7 mm polyps in the descending colon and                            in the transverse colon, removed with a cold snare.                            Resected and retrieved.                           - The examination was otherwise normal on direct                            and retroflexion views. Recommendation:           - Repeat colonoscopy in 3 - 5 years for                            surveillance pending pathology review.                           - Patient has a contact number available for                            emergencies. The signs and symptoms of potential                            delayed complications  were discussed with the                            patient. Return to normal activities tomorrow.                            Written discharge instructions were provided to the                            patient.                           - Resume previous diet.                           - Continue present medications.                           - Await pathology results. Ladene Artist, MD 05/01/2017 8:20:46 AM This report has been signed electronically.

## 2017-05-01 NOTE — Patient Instructions (Signed)
Impression/Recommendations:  Polyp handout given to patient.  Repeat colonoscopy in 3-5 years for surveillance, based on pathology results.  Resume previous diet. Continue present medications.  YOU HAD AN ENDOSCOPIC PROCEDURE TODAY AT [facility]:   Refer to the procedure report that was given to you for any specific questions about what was found during the examination.  If the procedure report does not answer your questions, please call your gastroenterologist to clarify.  If you requested that your care partner not be given the details of your procedure findings, then the procedure report has been included in a sealed envelope for you to review at your convenience later.  YOU SHOULD EXPECT: Some feelings of bloating in the abdomen. Passage of more gas than usual.  Walking can help get rid of the air that was put into your GI tract during the procedure and reduce the bloating. If you had a lower endoscopy (such as a colonoscopy or flexible sigmoidoscopy) you may notice spotting of blood in your stool or on the toilet paper. If you underwent a bowel prep for your procedure, you may not have a normal bowel movement for a few days.  Please Note:  You might notice some irritation and congestion in your nose or some drainage.  This is from the oxygen used during your procedure.  There is no need for concern and it should clear up in a day or so.  SYMPTOMS TO REPORT IMMEDIATELY:   Following lower endoscopy (colonoscopy or flexible sigmoidoscopy):  Excessive amounts of blood in the stool  Significant tenderness or worsening of abdominal pains  Swelling of the abdomen that is new, acute  Fever of 100F or higher For urgent or emergent issues, a gastroenterologist can be reached at any hour by calling [phone].   DIET:  We do recommend a small meal at first, but then you may proceed to your regular diet.  Drink plenty of fluids but you should avoid alcoholic beverages for 24  hours.  ACTIVITY:  You should plan to take it easy for the rest of today and you should NOT DRIVE or use heavy machinery until tomorrow (because of the sedation medicines used during the test).    FOLLOW UP: Our staff will call the number listed on your records the next business day following your procedure to check on you and address any questions or concerns that you may have regarding the information given to you following your procedure. If we do not reach you, we will leave a message.  However, if you are feeling well and you are not experiencing any problems, there is no need to return our call.  We will assume that you have returned to your regular daily activities without incident.  If any biopsies were taken you will be contacted by phone or by letter within the next 1-3 weeks.  Please call us at [phone] if you have not heard about the biopsies in 3 weeks.    SIGNATURES/CONFIDENTIALITY: You and/or your care partner have signed paperwork which will be entered into your electronic medical record.  These signatures attest to the fact that that the information above on your After Visit Summary has been reviewed and is understood.  Full responsibility of the confidentiality of this discharge information lies with you and/or your care-partner.

## 2017-05-02 ENCOUNTER — Telehealth: Payer: Self-pay

## 2017-05-02 ENCOUNTER — Telehealth: Payer: Self-pay | Admitting: *Deleted

## 2017-05-02 NOTE — Telephone Encounter (Signed)
  Follow up Call-  Call back number 05/01/2017  Post procedure Call Back phone  # [phone]  Permission to leave phone message Yes  Some recent data might be hidden     Patient questions:  Do you have a fever, pain , or abdominal swelling? No. Pain Score  0 *  Have you tolerated food without any problems? Yes.    Have you been able to return to your normal activities? Yes.    Do you have any questions about your discharge instructions: Diet   No. Medications  No. Follow up visit  No.  Do you have questions or concerns about your Care? No.  Actions: * If pain score is 4 or above: No action needed, pain <4.  No problems noted per pt. maw

## 2017-05-02 NOTE — Telephone Encounter (Signed)
Message left

## 2017-05-08 ENCOUNTER — Encounter: Payer: Self-pay | Admitting: Gastroenterology

## 2017-05-12 ENCOUNTER — Encounter: Payer: Self-pay | Admitting: Adult Health

## 2017-05-12 ENCOUNTER — Other Ambulatory Visit: Payer: Self-pay | Admitting: Adult Health

## 2017-05-12 ENCOUNTER — Ambulatory Visit (INDEPENDENT_AMBULATORY_CARE_PROVIDER_SITE_OTHER): Payer: BLUE CROSS/BLUE SHIELD | Admitting: Adult Health

## 2017-05-12 ENCOUNTER — Telehealth: Payer: Self-pay | Admitting: Adult Health

## 2017-05-12 VITALS — BP 108/68 | Temp 98.3°F | Ht 75.0 in | Wt 291.5 lb

## 2017-05-12 DIAGNOSIS — B349 Viral infection, unspecified: Secondary | ICD-10-CM | POA: Diagnosis not present

## 2017-05-12 DIAGNOSIS — R7982 Elevated C-reactive protein (CRP): Secondary | ICD-10-CM

## 2017-05-12 DIAGNOSIS — M791 Myalgia, unspecified site: Secondary | ICD-10-CM

## 2017-05-12 LAB — HEPATIC FUNCTION PANEL
ALBUMIN: 4.4 g/dL (ref 3.5–5.2)
ALK PHOS: 45 U/L (ref 39–117)
ALT: 55 U/L — AB (ref 0–53)
AST: 53 U/L — AB (ref 0–37)
BILIRUBIN DIRECT: 0.2 mg/dL (ref 0.0–0.3)
Total Bilirubin: 0.8 mg/dL (ref 0.2–1.2)
Total Protein: 7.4 g/dL (ref 6.0–8.3)

## 2017-05-12 LAB — CBC WITH DIFFERENTIAL/PLATELET
BASOS PCT: 0.5 % (ref 0.0–3.0)
Basophils Absolute: 0 10*3/uL (ref 0.0–0.1)
EOS PCT: 2.4 % (ref 0.0–5.0)
Eosinophils Absolute: 0.2 10*3/uL (ref 0.0–0.7)
HEMATOCRIT: 48.1 % (ref 39.0–52.0)
HEMOGLOBIN: 16.9 g/dL (ref 13.0–17.0)
Lymphocytes Relative: 16.9 % (ref 12.0–46.0)
Lymphs Abs: 1.1 10*3/uL (ref 0.7–4.0)
MCHC: 35.2 g/dL (ref 30.0–36.0)
MCV: 94.9 fl (ref 78.0–100.0)
MONOS PCT: 10.6 % (ref 3.0–12.0)
Monocytes Absolute: 0.7 10*3/uL (ref 0.1–1.0)
Neutro Abs: 4.4 10*3/uL (ref 1.4–7.7)
Neutrophils Relative %: 69.6 % (ref 43.0–77.0)
Platelets: 174 10*3/uL (ref 150.0–400.0)
RBC: 5.07 Mil/uL (ref 4.22–5.81)
RDW: 12.8 % (ref 11.5–15.5)
WBC: 6.4 10*3/uL (ref 4.0–10.5)

## 2017-05-12 LAB — HIGH SENSITIVITY CRP: CRP, High Sensitivity: 31.66 mg/L — ABNORMAL HIGH (ref 0.000–5.000)

## 2017-05-12 LAB — SEDIMENTATION RATE: Sed Rate: 17 mm/hr (ref 0–20)

## 2017-05-12 MED ORDER — ONDANSETRON HCL 4 MG PO TABS: 4.0000 mg | ORAL_TABLET | Freq: Three times a day (TID) | ORAL | 0 refills | Status: DC | PRN

## 2017-05-12 NOTE — Telephone Encounter (Signed)
Patient has been notified and verbalized understanding 

## 2017-05-12 NOTE — Telephone Encounter (Signed)
Please inform him that his results are not back yet
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
## 2017-05-12 NOTE — Patient Instructions (Signed)
It was great seeing you today   I am unsure of what is causing your conditions   I am going to get some blood work to see if we can figure it out.

## 2017-05-12 NOTE — Telephone Encounter (Signed)
Pt would like blood work results. Pt just had blood work today

## 2017-05-12 NOTE — Telephone Encounter (Signed)
See below

## 2017-05-12 NOTE — Addendum Note (Signed)
Addended by: Sandria Bales B on: 05/12/2017 03:04 PM   Modules accepted: Orders

## 2017-05-15 LAB — LYME AB/WESTERN BLOT REFLEX

## 2017-05-18 ENCOUNTER — Other Ambulatory Visit: Payer: Self-pay

## 2017-05-18 DIAGNOSIS — R748 Abnormal levels of other serum enzymes: Secondary | ICD-10-CM

## 2017-05-18 NOTE — Progress Notes (Signed)
Lab orders placed.  

## 2017-05-29 ENCOUNTER — Other Ambulatory Visit: Payer: Self-pay

## 2017-06-01 ENCOUNTER — Other Ambulatory Visit (INDEPENDENT_AMBULATORY_CARE_PROVIDER_SITE_OTHER): Payer: BLUE CROSS/BLUE SHIELD

## 2017-06-01 DIAGNOSIS — R748 Abnormal levels of other serum enzymes: Secondary | ICD-10-CM | POA: Diagnosis not present

## 2017-06-02 ENCOUNTER — Other Ambulatory Visit: Payer: Self-pay | Admitting: Adult Health

## 2017-06-02 LAB — HEPATITIS PANEL, ACUTE
HCV Ab: NEGATIVE
HEP B C IGM: NONREACTIVE
Hep A IgM: NONREACTIVE
Hepatitis B Surface Ag: NEGATIVE

## 2017-06-02 LAB — RHEUMATOID FACTOR: RHEUMATOID FACTOR: 31 [IU]/mL — AB (ref <14)

## 2017-06-02 LAB — ANA: ANA: NEGATIVE

## 2017-06-07 ENCOUNTER — Other Ambulatory Visit: Payer: Self-pay | Admitting: Adult Health

## 2017-06-07 DIAGNOSIS — B349 Viral infection, unspecified: Secondary | ICD-10-CM

## 2017-06-07 DIAGNOSIS — R768 Other specified abnormal immunological findings in serum: Secondary | ICD-10-CM

## 2017-06-07 MED ORDER — ONDANSETRON HCL 4 MG PO TABS: 4.0000 mg | ORAL_TABLET | Freq: Three times a day (TID) | ORAL | 0 refills | Status: DC | PRN

## 2017-11-06 ENCOUNTER — Ambulatory Visit: Payer: Self-pay | Admitting: *Deleted

## 2017-11-06 NOTE — Telephone Encounter (Signed)
Pt thinks he is starting to get a cold. He does not have an elevated temp. He states some congestion in his lungs and not coughing up very much.  Care advice given to him. Call us back if he thinks he is getting worse, or elevation in fever. Pt voiced understanding.  Reason for Disposition . Cough with cold symptoms (e.g., runny nose, postnasal drip, throat clearing)  Answer Assessment - Initial Assessment Questions 1. ONSET: "When did the cough begin?"      Last night 2. SEVERITY: "How bad is the cough today?"      Not bad right now 3. RESPIRATORY DISTRESS: "Describe your breathing."      normal 4. FEVER: "Do you have a fever?" If so, ask: "What is your temperature, how was it measured, and when did it start?"     no 5. SPUTUM: "Describe the color of your sputum" (clear, white, yellow, green)     Little yellow 6. HEMOPTYSIS: "Are you coughing up any blood?" If so ask: "How much?" (flecks, streaks, tablespoons, etc.)     no 7. CARDIAC HISTORY: "Do you have any history of heart disease?" (e.g., heart attack, congestive heart failure)      no 8. LUNG HISTORY: "Do you have any history of lung disease?"  (e.g., pulmonary embolus, asthma, emphysema)     Pneumonia, some type of lung episode that kept him in the hospital for 17 days 9. PE RISK FACTORS: "Do you have a history of blood clots?" (or: recent major surgery, recent prolonged travel, bedridden )     no 10. OTHER SYMPTOMS: "Do you have any other symptoms?" (e.g., runny nose, wheezing, chest pain)       Little wheezing, runny rose 11. PREGNANCY: "Is there any chance you are pregnant?" "When was your last menstrual period?"       no 12. TRAVEL: "Have you traveled out of the country in the last month?" (e.g., travel history, exposures)       no  Protocols used: COUGH - ACUTE NON-PRODUCTIVE-A-AH, COUGH - ACUTE PRODUCTIVE-A-AH

## 2018-01-17 ENCOUNTER — Telehealth (HOSPITAL_COMMUNITY): Payer: Self-pay | Admitting: *Deleted

## 2018-01-17 ENCOUNTER — Emergency Department (HOSPITAL_COMMUNITY)
Admission: EM | Admit: 2018-01-17 | Discharge: 2018-01-17 | Disposition: A | Discharge status: Home / Self Care | Payer: BLUE CROSS/BLUE SHIELD | Attending: Emergency Medicine | Admitting: Emergency Medicine

## 2018-01-17 ENCOUNTER — Emergency Department (HOSPITAL_COMMUNITY): Payer: BLUE CROSS/BLUE SHIELD

## 2018-01-17 ENCOUNTER — Encounter (HOSPITAL_COMMUNITY): Payer: Self-pay | Admitting: Emergency Medicine

## 2018-01-17 ENCOUNTER — Other Ambulatory Visit: Payer: Self-pay

## 2018-01-17 DIAGNOSIS — Z7982 Long term (current) use of aspirin: Secondary | ICD-10-CM | POA: Insufficient documentation

## 2018-01-17 DIAGNOSIS — R002 Palpitations: Secondary | ICD-10-CM | POA: Diagnosis present

## 2018-01-17 DIAGNOSIS — I4891 Unspecified atrial fibrillation: Secondary | ICD-10-CM

## 2018-01-17 DIAGNOSIS — I10 Essential (primary) hypertension: Secondary | ICD-10-CM | POA: Insufficient documentation

## 2018-01-17 DIAGNOSIS — Z79899 Other long term (current) drug therapy: Secondary | ICD-10-CM | POA: Diagnosis not present

## 2018-01-17 DIAGNOSIS — I480 Paroxysmal atrial fibrillation: Secondary | ICD-10-CM | POA: Insufficient documentation

## 2018-01-17 LAB — BASIC METABOLIC PANEL
Anion gap: 11 (ref 5–15)
BUN: 10 mg/dL (ref 6–20)
CALCIUM: 9.3 mg/dL (ref 8.9–10.3)
CO2: 21 mmol/L — ABNORMAL LOW (ref 22–32)
CREATININE: 0.89 mg/dL (ref 0.61–1.24)
Chloride: 106 mmol/L (ref 101–111)
GFR calc Af Amer: >60 mL/min (ref >60)
Glucose, Bld: 160 mg/dL — ABNORMAL HIGH (ref 65–99)
Potassium: 4 mmol/L (ref 3.5–5.1)
SODIUM: 138 mmol/L (ref 135–145)

## 2018-01-17 LAB — CBC
HCT: 47 % (ref 39.0–52.0)
Hemoglobin: 16.4 g/dL (ref 13.0–17.0)
MCH: 32.8 pg (ref 26.0–34.0)
MCHC: 34.9 g/dL (ref 30.0–36.0)
MCV: 94 fL (ref 78.0–100.0)
PLATELETS: 180 10*3/uL (ref 150–400)
RBC: 5 MIL/uL (ref 4.22–5.81)
RDW: 13 % (ref 11.5–15.5)
WBC: 7 10*3/uL (ref 4.0–10.5)

## 2018-01-17 LAB — I-STAT TROPONIN, ED: TROPONIN I, POC: 0 ng/mL (ref 0.00–0.08)

## 2018-01-17 MED ORDER — METOPROLOL TARTRATE 25 MG PO TABS: 25.0000 mg | ORAL_TABLET | Freq: Two times a day (BID) | ORAL | 0 refills | Status: DC

## 2018-01-17 MED ORDER — PROPOFOL 10 MG/ML IV BOLUS
0.5000 mg/kg | Freq: Once | INTRAVENOUS | Status: DC
  Filled 2018-01-17: qty 20

## 2018-01-17 MED ORDER — METOPROLOL TARTRATE 25 MG PO TABS
25.0000 mg | ORAL_TABLET | Freq: Once | ORAL | Status: AC
  Administered 2018-01-17: 25 mg via ORAL
  Filled 2018-01-17: qty 1

## 2018-01-17 MED ORDER — APIXABAN 5 MG PO TABS
5.0000 mg | ORAL_TABLET | Freq: Two times a day (BID) | ORAL | Status: DC
  Administered 2018-01-17: 5 mg via ORAL
  Filled 2018-01-17: qty 1

## 2018-01-17 MED ORDER — DILTIAZEM HCL 25 MG/5ML IV SOLN
20.0000 mg | Freq: Once | INTRAVENOUS | Status: AC
  Administered 2018-01-17: 20 mg via INTRAVENOUS
  Filled 2018-01-17: qty 5

## 2018-01-17 MED ORDER — PROPOFOL 10 MG/ML IV BOLUS
INTRAVENOUS | Status: AC | PRN
  Administered 2018-01-17 (×2): 10 mg via INTRAVENOUS
  Administered 2018-01-17: 65 mg via INTRAVENOUS
  Administered 2018-01-17: 15 mg via INTRAVENOUS

## 2018-01-17 MED ORDER — APIXABAN 5 MG PO TABS: 5.0000 mg | ORAL_TABLET | Freq: Two times a day (BID) | ORAL | 0 refills | Status: DC
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
## 2018-01-17 NOTE — Sedation Documentation (Signed)
Patient cardioverted at 120j.  Patient went directly back into NSR.  Another EKG ran showing NSR.

## 2018-01-17 NOTE — Discharge Instructions (Addendum)
Information on my medicine - ELIQUIS (apixaban)  This medication education was reviewed with me or my healthcare representative as part of my discharge preparation.   WHY WAS ELIQUIS PRESCRIBED FOR YOU? Eliquis was prescribed for you to reduce the risk of a blood clot forming that can cause a stroke if you have a medical condition called atrial fibrillation (a type of irregular heartbeat).  WHAT DO YOU NEED TO KNOW ABOUT ELIQUIS ? Take your Eliquis TWICE DAILY - one tablet in the morning and one tablet in the evening with or without food. If you have difficulty swallowing the tablet whole please discuss with your pharmacist how to take the medication safely.  Take Eliquis exactly as prescribed by your doctor and DO NOT stop taking Eliquis without talking to the doctor who prescribed the medication.  Stopping may increase your risk of developing a stroke.  Refill your prescription before you run out.  After discharge, you should have regular check-up appointments with your healthcare provider that is prescribing your Eliquis.  In the future your dose may need to be changed if your kidney function or weight changes by a significant amount or as you get older.  WHAT DO YOU DO IF YOU MISS A DOSE? If you miss a dose, take it as soon as you remember on the same day and resume taking twice daily.  Do not take more than one dose of ELIQUIS at the same time to make up a missed dose.  IMPORTANT SAFETY INFORMATION A possible side effect of Eliquis is bleeding. You should call your healthcare provider right away if you experience any of the following: Bleeding from an injury or your nose that does not stop. Unusual colored urine (red or dark brown) or unusual colored stools (red or black). Unusual bruising for unknown reasons. A serious fall or if you hit your head (even if there is no bleeding).  Some medicines may interact with Eliquis and might increase your risk of bleeding or clotting  while on Eliquis. To help avoid this, consult your healthcare provider or pharmacist prior to using any new prescription or non-prescription medications, including herbals, vitamins, non-steroidal anti-inflammatory drugs (NSAIDs) and supplements.  This website has more information on Eliquis (apixaban): http://www.eliquis.com/eliquis/home

## 2018-01-17 NOTE — ED Triage Notes (Addendum)
Pt in from work via [employer] with tachycardia and palpitations. Pt states he was about to climb ladder, heart began racing and felt near syncopal. EKG was 170 Afib on EMS arrival. Given 10mg  Metoprolol en route, HR now 130's. Did not take usual 25mg  Metoprolol dose this am. Denies hx afib, BP 124/76. C/o chest pressure, denies n/v. States he has been dealing with cold s/s recently, denies any decongestant use

## 2018-01-17 NOTE — Telephone Encounter (Signed)
Pt referred from ED after dccv.  LMOM for pt to [facility] to sched
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
## 2018-01-19 ENCOUNTER — Ambulatory Visit (HOSPITAL_COMMUNITY)
Admission: RE | Admit: 2018-01-19 | Discharge: 2018-01-19 | Disposition: A | Discharge status: Home / Self Care | Payer: BLUE CROSS/BLUE SHIELD | Source: Ambulatory Visit | Attending: Nurse Practitioner | Admitting: Nurse Practitioner

## 2018-01-19 ENCOUNTER — Encounter (HOSPITAL_COMMUNITY): Payer: Self-pay | Admitting: Nurse Practitioner

## 2018-01-19 VITALS — BP 130/86 | HR 65 | Ht 76.0 in | Wt 295.0 lb

## 2018-01-19 DIAGNOSIS — I493 Ventricular premature depolarization: Secondary | ICD-10-CM | POA: Insufficient documentation

## 2018-01-19 DIAGNOSIS — L719 Rosacea, unspecified: Secondary | ICD-10-CM | POA: Insufficient documentation

## 2018-01-19 DIAGNOSIS — R000 Tachycardia, unspecified: Secondary | ICD-10-CM | POA: Diagnosis not present

## 2018-01-19 DIAGNOSIS — G4730 Sleep apnea, unspecified: Secondary | ICD-10-CM | POA: Diagnosis not present

## 2018-01-19 DIAGNOSIS — I480 Paroxysmal atrial fibrillation: Secondary | ICD-10-CM

## 2018-01-19 DIAGNOSIS — Z7901 Long term (current) use of anticoagulants: Secondary | ICD-10-CM | POA: Diagnosis not present

## 2018-01-19 DIAGNOSIS — I483 Typical atrial flutter: Secondary | ICD-10-CM | POA: Diagnosis not present

## 2018-01-19 DIAGNOSIS — E669 Obesity, unspecified: Secondary | ICD-10-CM | POA: Insufficient documentation

## 2018-01-19 DIAGNOSIS — Z79899 Other long term (current) drug therapy: Secondary | ICD-10-CM | POA: Diagnosis not present

## 2018-01-19 DIAGNOSIS — I10 Essential (primary) hypertension: Secondary | ICD-10-CM | POA: Insufficient documentation

## 2018-01-19 MED ORDER — APIXABAN 5 MG PO TABS: 5.0000 mg | ORAL_TABLET | Freq: Two times a day (BID) | ORAL | 3 refills | Status: DC
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
## 2018-01-29 ENCOUNTER — Ambulatory Visit (INDEPENDENT_AMBULATORY_CARE_PROVIDER_SITE_OTHER): Payer: BLUE CROSS/BLUE SHIELD | Admitting: Family Medicine

## 2018-01-29 ENCOUNTER — Encounter: Payer: Self-pay | Admitting: Family Medicine

## 2018-01-29 VITALS — BP 110/60 | HR 68 | Temp 101.0°F | Wt 297.8 lb

## 2018-01-29 DIAGNOSIS — R52 Pain, unspecified: Secondary | ICD-10-CM

## 2018-01-29 DIAGNOSIS — J111 Influenza due to unidentified influenza virus with other respiratory manifestations: Secondary | ICD-10-CM

## 2018-01-29 DIAGNOSIS — R509 Fever, unspecified: Secondary | ICD-10-CM

## 2018-01-29 LAB — POCT INFLUENZA A/B
Influenza A, POC: POSITIVE — AB
Influenza B, POC: NEGATIVE

## 2018-01-29 MED ORDER — ONDANSETRON 8 MG PO TBDP: 8.0000 mg | ORAL_TABLET | Freq: Three times a day (TID) | ORAL | 0 refills | Status: DC | PRN

## 2018-01-29 MED ORDER — OSELTAMIVIR PHOSPHATE 75 MG PO CAPS: 75.0000 mg | ORAL_CAPSULE | Freq: Two times a day (BID) | ORAL | 0 refills | Status: DC
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
## 2018-01-29 NOTE — Patient Instructions (Signed)

## 2018-01-31 ENCOUNTER — Ambulatory Visit: Payer: Self-pay

## 2018-01-31 NOTE — Telephone Encounter (Signed)
He has received Tamiflu two days ago with Dr. Elease Hashimoto. I would like to see him before we send in anything, if he is not feeling better

## 2018-01-31 NOTE — Telephone Encounter (Signed)
Called pt and states that his fever is 99.6, has an occasional productive cough with yellow phlegm. Pt c/o head and chest congestion. Discussed home care with pt.  Discussed usual course of the Flu with pt using the care advice. Pt asking if something could be called in for his sx.  Reason for Disposition . [1] Probable influenza (fever) with no complications AND [9] NOT HIGH RISK  Answer Assessment - Initial Assessment Questions 1. WORST SYMPTOM: "What is your worst symptom?" (e.g., cough, runny nose, muscle aches, headache, sore throat, fever)      Congestion of head and chest, low grade fever, achy, headache 2. ONSET: "When did your flu symptoms start?"     Sunday am  3. COUGH: "How bad is the cough?"       Congested - cough is not bad  4. RESPIRATORY DISTRESS: "Describe your breathing."      No SOB 5. FEVER: "Do you have a fever?" If so, ask: "What is your temperature, how was it measured, and when did it start?"     Yes 99.6  6. EXPOSURE: "Were you exposed to someone with influenza?"       no 7. FLU VACCINE: "Did you get a flu shot this year?"     no 8. HIGH RISK DISEASE: "Do you any chronic medical problems?" (e.g., heart or lung disease, asthma, weak immune system, or other HIGH RISK conditions)     no 9. PREGNANCY: "Is there any chance you are pregnant?" "When was your last menstrual period?"     n/a 10. OTHER SYMPTOMS: "Do you have any other symptoms?"  (e.g., runny nose, muscle aches, headache, sore throat)      Runny nose, muscle ache, headache,  Protocols used: INFLUENZA - SEASONAL-A-AH

## 2018-02-01 ENCOUNTER — Telehealth: Payer: Self-pay | Admitting: Adult Health

## 2018-02-01 NOTE — Telephone Encounter (Signed)
Was seen by Dr. B for this visit

## 2018-02-01 NOTE — Telephone Encounter (Signed)
Copied from [id number]. Topic: General - Other >> Feb 01, 2018  2:04 PM Lolita Rieger, [place] wrote: Reason for CRM: pt would like a work note faxed to his employer from his visit week pt plans to return to work on Monday Fax # [phone] Attn: Ernst Bowler

## 2018-02-01 NOTE — Telephone Encounter (Signed)
Left a message for a return call.  CRM created. 

## 2018-02-02 ENCOUNTER — Ambulatory Visit (HOSPITAL_COMMUNITY)
Admission: RE | Admit: 2018-02-02 | Discharge: 2018-02-02 | Disposition: A | Discharge status: Home / Self Care | Payer: BLUE CROSS/BLUE SHIELD | Source: Ambulatory Visit | Attending: Nurse Practitioner | Admitting: Nurse Practitioner

## 2018-02-02 DIAGNOSIS — I480 Paroxysmal atrial fibrillation: Secondary | ICD-10-CM | POA: Insufficient documentation

## 2018-02-02 DIAGNOSIS — G4730 Sleep apnea, unspecified: Secondary | ICD-10-CM | POA: Insufficient documentation

## 2018-02-02 DIAGNOSIS — I10 Essential (primary) hypertension: Secondary | ICD-10-CM | POA: Insufficient documentation

## 2018-02-02 NOTE — Telephone Encounter (Signed)
Left a message for a return call.  CRM in system.

## 2018-02-02 NOTE — Telephone Encounter (Signed)
Letter faxed and confirmed

## 2018-02-02 NOTE — Telephone Encounter (Signed)
OK to send work excuse for all this week- he did have influenza.

## 2018-02-05 ENCOUNTER — Ambulatory Visit (INDEPENDENT_AMBULATORY_CARE_PROVIDER_SITE_OTHER): Payer: BLUE CROSS/BLUE SHIELD | Admitting: Internal Medicine

## 2018-02-05 ENCOUNTER — Encounter: Payer: Self-pay | Admitting: Internal Medicine

## 2018-02-05 VITALS — BP 132/74 | HR 62 | Ht 76.0 in | Wt 294.0 lb

## 2018-02-05 DIAGNOSIS — G4733 Obstructive sleep apnea (adult) (pediatric): Secondary | ICD-10-CM | POA: Diagnosis not present

## 2018-02-05 DIAGNOSIS — I10 Essential (primary) hypertension: Secondary | ICD-10-CM | POA: Diagnosis not present

## 2018-02-05 DIAGNOSIS — Z9989 Dependence on other enabling machines and devices: Secondary | ICD-10-CM

## 2018-02-05 DIAGNOSIS — I483 Typical atrial flutter: Secondary | ICD-10-CM | POA: Diagnosis not present
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
## 2018-02-05 NOTE — Patient Instructions (Addendum)
Medication Instructions:  Your physician recommends that you continue on your current medications as directed. Please refer to the Current Medication list given to you today.  Labwork: You will get lab work 2 weeks prior to your ablation:  BMP and CBC. Please schedule.  Testing/Procedures: Your physician has recommended that you have an ablation. Catheter ablation is a medical procedure used to treat some cardiac arrhythmias (irregular heartbeats). During catheter ablation, a long, thin, flexible tube is put into a blood vessel in your groin (upper thigh), or neck. This tube is called an ablation catheter. It is then guided to your heart through the blood vessel. Radio frequency waves destroy small areas of heart tissue where abnormal heartbeats may cause an arrhythmia to start. Please see the instruction sheet given to you today.  Follow-Up: You will follow up with Dr. Rayann Heman 4 weeks after your ablation.  Any Other Special Instructions Will Be Listed Below (If Applicable).  Please arrive at the [facility] [place] [facility] at:  5:30 am on February 20, 2018.  Go to admitting. Do not eat or drink after midnight prior to procedure. Do not take any medications the morning of the procedure. Plan for one night stay.  You will need someone to drive you home at discharge  If you need a refill on your cardiac medications before your next appointment, please call your pharmacy.   Cardiac Ablation Cardiac ablation is a procedure to disable (ablate) a small amount of heart tissue in very specific places. The heart has many electrical connections. Sometimes these connections are abnormal and can cause the heart to beat very fast or irregularly. Ablating some of the problem areas can improve the heart rhythm or return it to normal. Ablation may be done for people who:  Have Wolff-Parkinson-White syndrome.  Have fast heart rhythms (tachycardia).  Have taken medicines for an abnormal  heart rhythm (arrhythmia) that were not effective or caused side effects.  Have a high-risk heartbeat that may be life-threatening.  During the procedure, a small incision is made in the neck or the groin, and a long, thin, flexible tube (catheter) is inserted into the incision and moved to the heart. Small devices (electrodes) on the tip of the catheter will send out electrical currents. A type of X-ray (fluoroscopy) will be used to help guide the catheter and to provide images of the heart. Tell a health care provider about:  Any allergies you have.  All medicines you are taking, including vitamins, herbs, eye drops, creams, and over-the-counter medicines.  Any problems you or family members have had with anesthetic medicines.  Any blood disorders you have.  Any surgeries you have had.  Any medical conditions you have, such as kidney failure.  Whether you are pregnant or may be pregnant. What are the risks? Generally, this is a safe procedure. However, problems may occur, including:  Infection.  Bruising and bleeding at the catheter insertion site.  Bleeding into the chest, especially into the sac that surrounds the heart. This is a serious complication.  Stroke or blood clots.  Damage to other structures or organs.  Allergic reaction to medicines or dyes.  Need for a permanent pacemaker if the normal electrical system is damaged. A pacemaker is a small computer that sends electrical signals to the heart and helps your heart beat normally.  The procedure not being fully effective. This may not be recognized until months later. Repeat ablation procedures are sometimes required.  What happens before  the procedure?  Follow instructions from your health care provider about eating or drinking restrictions.  Ask your health care provider about: ? Changing or stopping your regular medicines. This is especially important if you are taking diabetes medicines or blood  thinners. ? Taking medicines such as aspirin and ibuprofen. These medicines can thin your blood. Do not take these medicines before your procedure if your health care provider instructs you not to.  Plan to have someone take you home from the hospital or clinic.  If you will be going home right after the procedure, plan to have someone with you for 24 hours. What happens during the procedure?  To lower your risk of infection: ? Your health care team will wash or sanitize their hands. ? Your skin will be washed with soap. ? Hair may be removed from the incision area.  An IV tube will be inserted into one of your veins.  You will be given a medicine to help you relax (sedative).  The skin on your neck or groin will be numbed.  An incision will be made in your neck or your groin.  A needle will be inserted through the incision and into a large vein in your neck or groin.  A catheter will be inserted into the needle and moved to your heart.  Dye may be injected through the catheter to help your surgeon see the area of the heart that needs treatment.  Electrical currents will be sent from the catheter to ablate heart tissue in desired areas. There are three types of energy that may be used to ablate heart tissue: ? Heat (radiofrequency energy). ? Laser energy. ? Extreme cold (cryoablation).  When the necessary tissue has been ablated, the catheter will be removed.  Pressure will be held on the catheter insertion area to prevent excessive bleeding.  A bandage (dressing) will be placed over the catheter insertion area. The procedure may vary among health care providers and hospitals. What happens after the procedure?  Your blood pressure, heart rate, breathing rate, and blood oxygen level will be monitored until the medicines you were given have worn off.  Your catheter insertion area will be monitored for bleeding. You will need to lie still for a few hours to ensure that you do  not bleed from the catheter insertion area.  Do not drive for 24 hours or as long as directed by your health care provider. Summary  Cardiac ablation is a procedure to disable (ablate) a small amount of heart tissue in very specific places. Ablating some of the problem areas can improve the heart rhythm or return it to normal.  During the procedure, electrical currents will be sent from the catheter to ablate heart tissue in desired areas. This information is not intended to replace advice given to you by your health care provider. Make sure you discuss any questions you have with your health care provider. Document Released: 04/16/2009 Document Revised: 10/17/2016 Document Reviewed: 10/17/2016 Elsevier Interactive Patient Education  [employer].
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
## 2018-02-06 NOTE — Telephone Encounter (Signed)
Left a message for a return call.  Have tried to reach the pt by telephone multiple times.  Will now close the note.

## 2018-02-19 ENCOUNTER — Other Ambulatory Visit: Payer: BLUE CROSS/BLUE SHIELD | Admitting: *Deleted

## 2018-02-19 DIAGNOSIS — I10 Essential (primary) hypertension: Secondary | ICD-10-CM

## 2018-02-19 DIAGNOSIS — I483 Typical atrial flutter: Secondary | ICD-10-CM

## 2018-02-19 DIAGNOSIS — G4733 Obstructive sleep apnea (adult) (pediatric): Secondary | ICD-10-CM

## 2018-02-19 DIAGNOSIS — Z9989 Dependence on other enabling machines and devices: Secondary | ICD-10-CM

## 2018-02-19 LAB — CBC WITH DIFFERENTIAL/PLATELET
Basophils Absolute: 0 10*3/uL (ref 0.0–0.2)
Basos: 0 %
EOS (ABSOLUTE): 0.1 10*3/uL (ref 0.0–0.4)
EOS: 1 %
HEMATOCRIT: 46.2 % (ref 37.5–51.0)
HEMOGLOBIN: 16 g/dL (ref 13.0–17.7)
Immature Grans (Abs): 0 10*3/uL (ref 0.0–0.1)
Immature Granulocytes: 0 %
LYMPHS ABS: 3 10*3/uL (ref 0.7–3.1)
Lymphs: 33 %
MCH: 31.6 pg (ref 26.6–33.0)
MCHC: 34.6 g/dL (ref 31.5–35.7)
MCV: 91 fL (ref 79–97)
MONOS ABS: 0.7 10*3/uL (ref 0.1–0.9)
Monocytes: 8 %
NEUTROS ABS: 5.2 10*3/uL (ref 1.4–7.0)
Neutrophils: 58 %
Platelets: 237 10*3/uL (ref 150–379)
RBC: 5.07 x10E6/uL (ref 4.14–5.80)
RDW: 12.9 % (ref 12.3–15.4)
WBC: 9 10*3/uL (ref 3.4–10.8)

## 2018-02-19 LAB — BASIC METABOLIC PANEL
BUN / CREAT RATIO: 8 — AB (ref 9–20)
BUN: 7 mg/dL (ref 6–24)
CO2: 24 mmol/L (ref 20–29)
CREATININE: 0.91 mg/dL (ref 0.76–1.27)
Calcium: 9.7 mg/dL (ref 8.7–10.2)
Chloride: 104 mmol/L (ref 96–106)
GFR calc non Af Amer: 92 mL/min/{1.73_m2} (ref >59)
GFR, EST AFRICAN AMERICAN: 106 mL/min/{1.73_m2} (ref >59)
Glucose: 89 mg/dL (ref 65–99)
Potassium: 4.3 mmol/L (ref 3.5–5.2)
SODIUM: 142 mmol/L (ref 134–144)

## 2018-02-19 NOTE — Anesthesia Preprocedure Evaluation (Addendum)
Anesthesia Evaluation  Patient identified by MRN, date of birth, ID band Patient awake    Reviewed: Allergy & Precautions, NPO status , Patient's Chart, lab work & pertinent test results, reviewed documented beta blocker date and time   History of Anesthesia Complications Negative for: history of anesthetic complications  Airway Mallampati: II  TM Distance: >3 FB Neck ROM: Full    Dental  (+) Dental Advisory Given   Pulmonary sleep apnea and Continuous Positive Airway Pressure Ventilation , Recent URI , Resolved,    breath sounds clear to auscultation       Cardiovascular hypertension, Pt. on medications and Pt. on home beta blockers + dysrhythmias Atrial Fibrillation  Rhythm:Regular Rate:Normal  2/19 ECHO: EF 50-55%, valves OK '15 Stress: EF 61%, no ischemia   Neuro/Psych negative neurological ROS     GI/Hepatic Neg liver ROS, GERD  Controlled,  Endo/Other  Morbid obesity  Renal/GU negative Renal ROS     Musculoskeletal   Abdominal (+) + obese,   Peds  Hematology eliquis   Anesthesia Other Findings   Reproductive/Obstetrics                            Anesthesia Physical Anesthesia Plan  ASA: III  Anesthesia Plan: MAC   Post-op Pain Management:    Induction:   PONV Risk Score and Plan: 1 and Ondansetron and Treatment may vary due to age or medical condition  Airway Management Planned: Natural Airway and Simple Face Mask  Additional Equipment:   Intra-op Plan:   Post-operative Plan:   Informed Consent: I have reviewed the patients History and Physical, chart, labs and discussed the procedure including the risks, benefits and alternatives for the proposed anesthesia with the patient or authorized representative who has indicated his/her understanding and acceptance.   Dental advisory given  Plan Discussed with: CRNA and Surgeon  Anesthesia Plan Comments: (Plan routine  monitors, MAC)        Anesthesia Quick Evaluation

## 2018-02-20 ENCOUNTER — Encounter (HOSPITAL_COMMUNITY)
Admission: RE | Disposition: A | Discharge status: Home / Self Care | Payer: Self-pay | Source: Ambulatory Visit | Attending: Internal Medicine

## 2018-02-20 ENCOUNTER — Ambulatory Visit (HOSPITAL_COMMUNITY): Payer: BLUE CROSS/BLUE SHIELD | Admitting: Anesthesiology

## 2018-02-20 ENCOUNTER — Encounter (HOSPITAL_COMMUNITY): Payer: Self-pay | Admitting: Certified Registered Nurse Anesthetist

## 2018-02-20 ENCOUNTER — Ambulatory Visit (HOSPITAL_COMMUNITY)
Admission: RE | Admit: 2018-02-20 | Discharge: 2018-02-20 | Disposition: A | Discharge status: Home / Self Care | Payer: BLUE CROSS/BLUE SHIELD | Source: Ambulatory Visit | Attending: Internal Medicine | Admitting: Internal Medicine

## 2018-02-20 DIAGNOSIS — G4733 Obstructive sleep apnea (adult) (pediatric): Secondary | ICD-10-CM | POA: Insufficient documentation

## 2018-02-20 DIAGNOSIS — Z79899 Other long term (current) drug therapy: Secondary | ICD-10-CM | POA: Diagnosis not present

## 2018-02-20 DIAGNOSIS — Z7901 Long term (current) use of anticoagulants: Secondary | ICD-10-CM | POA: Diagnosis not present

## 2018-02-20 DIAGNOSIS — Z6835 Body mass index (BMI) 35.0-35.9, adult: Secondary | ICD-10-CM | POA: Diagnosis not present

## 2018-02-20 DIAGNOSIS — Z792 Long term (current) use of antibiotics: Secondary | ICD-10-CM | POA: Insufficient documentation

## 2018-02-20 DIAGNOSIS — J309 Allergic rhinitis, unspecified: Secondary | ICD-10-CM | POA: Insufficient documentation

## 2018-02-20 DIAGNOSIS — I483 Typical atrial flutter: Secondary | ICD-10-CM | POA: Diagnosis not present

## 2018-02-20 DIAGNOSIS — I10 Essential (primary) hypertension: Secondary | ICD-10-CM | POA: Insufficient documentation

## 2018-02-20 HISTORY — PX: A-FLUTTER ABLATION: EP1230

## 2018-02-20 SURGERY — A-FLUTTER ABLATION
Anesthesia: Monitor Anesthesia Care

## 2018-02-20 MED ORDER — PROPOFOL 10 MG/ML IV BOLUS
INTRAVENOUS | Status: DC | PRN
  Administered 2018-02-20: 20 mg via INTRAVENOUS
  Administered 2018-02-20: 15 mg via INTRAVENOUS

## 2018-02-20 MED ORDER — BUPIVACAINE HCL (PF) 0.25 % IJ SOLN
INTRAMUSCULAR | Status: AC
  Filled 2018-02-20: qty 30

## 2018-02-20 MED ORDER — PHENYLEPHRINE HCL 10 MG/ML IJ SOLN
INTRAMUSCULAR | Status: DC | PRN
  Administered 2018-02-20: 80 ug via INTRAVENOUS

## 2018-02-20 MED ORDER — SODIUM CHLORIDE 0.9% FLUSH: 3.0000 mL | INTRAVENOUS | Status: DC | PRN

## 2018-02-20 MED ORDER — SODIUM CHLORIDE 0.9 % IV SOLN: 250.0000 mL | INTRAVENOUS | Status: DC | PRN

## 2018-02-20 MED ORDER — ONDANSETRON HCL 4 MG/2ML IJ SOLN
INTRAMUSCULAR | Status: DC | PRN
  Administered 2018-02-20: 4 mg via INTRAVENOUS

## 2018-02-20 MED ORDER — SODIUM CHLORIDE 0.9% FLUSH: 3.0000 mL | Freq: Two times a day (BID) | INTRAVENOUS | Status: DC

## 2018-02-20 MED ORDER — BUPIVACAINE HCL (PF) 0.25 % IJ SOLN
INTRAMUSCULAR | Status: DC | PRN
  Administered 2018-02-20: 30 mL

## 2018-02-20 MED ORDER — PROPOFOL 500 MG/50ML IV EMUL
INTRAVENOUS | Status: DC | PRN
  Administered 2018-02-20: 09:00:00 via INTRAVENOUS
  Administered 2018-02-20: 100 ug/kg/min via INTRAVENOUS

## 2018-02-20 MED ORDER — MIDAZOLAM HCL 5 MG/5ML IJ SOLN
INTRAMUSCULAR | Status: DC | PRN
  Administered 2018-02-20: 2 mg via INTRAVENOUS

## 2018-02-20 MED ORDER — SODIUM CHLORIDE 0.9 % IV SOLN
INTRAVENOUS | Status: DC
  Administered 2018-02-20: 07:00:00 via INTRAVENOUS

## 2018-02-20 MED ORDER — HYDROCODONE-ACETAMINOPHEN 5-325 MG PO TABS: 1.0000 | ORAL_TABLET | ORAL | Status: DC | PRN

## 2018-02-20 SURGICAL SUPPLY — 9 items
BLANKET WARM UNDERBOD FULL ACC (MISCELLANEOUS) ×3 IMPLANT
CATH EZ STEER NAV 8MM F-J CUR (ABLATOR) ×2 IMPLANT
CATH WEBSTER BI DIR CS D-F CRV (CATHETERS) ×2 IMPLANT
PACK EP LATEX FREE (CUSTOM PROCEDURE TRAY) ×3
PACK EP LF (CUSTOM PROCEDURE TRAY) ×1 IMPLANT
PAD DEFIB LIFELINK (PAD) ×3 IMPLANT
PATCH CARTO3 (PAD) ×3 IMPLANT
SHEATH AVANTI 11CM 7FR (MISCELLANEOUS) ×3 IMPLANT
SHEATH AVANTI 11CM 8FR (MISCELLANEOUS) ×2 IMPLANT

## 2018-02-20 NOTE — Discharge Instructions (Signed)
Post procedure care instructions No driving for 4 days. No lifting over 5 lbs for 1 week. No vigorous or sexual activity for 1 week. You may return to work in  7 days. Keep procedure site clean & dry.   You may shower, but no soaking baths/hot tubs/pools for 1 week.    Cardiac Ablation, Care After This sheet gives you information about how to care for yourself after your procedure. Your health care provider may also give you more specific instructions. If you have problems or questions, contact your health care provider. What can I expect after the procedure? After the procedure, it is common to have:  Bruising around your puncture site.  Tenderness around your puncture site.  Skipped heartbeats.  Tiredness (fatigue).  Follow these instructions at home: Puncture site care  Follow instructions from your health care provider about how to take care of your puncture site. Make sure you: ? Wash your hands with soap and water before you change your bandage (dressing). If soap and water are not available, use hand sanitizer. ? Change your dressing as told by your health care provider. ? Leave stitches (sutures), skin glue, or adhesive strips in place. These skin closures may need to stay in place for up to 2 weeks. If adhesive strip edges start to loosen and curl up, you may trim the loose edges. Do not remove adhesive strips completely unless your health care provider tells you to do that.  Check your puncture site every day for signs of infection. Check for: ? Redness, swelling, or pain. ? Fluid or blood. If your puncture site starts to bleed, lie down on your back, apply firm pressure to the area, and contact your health care provider. ? Warmth. ? Pus or a bad smell. Driving  Ask your health care provider when it is safe for you to drive again after the procedure.  Do not drive or use heavy machinery while taking prescription pain medicine.  Do not drive for 24 hours if you were given  a medicine to help you relax (sedative) during your procedure. Activity  Avoid activities that take a lot of effort for at least 3 days after your procedure.  Do not lift anything that is heavier than 10 lb (4.5 kg), or the limit that you are told, until your health care provider says that it is safe.  Return to your normal activities as told by your health care provider. Ask your health care provider what activities are safe for you. General instructions  Take over-the-counter and prescription medicines only as told by your health care provider.  Do not use any products that contain nicotine or tobacco, such as cigarettes and e-cigarettes. If you need help quitting, ask your health care provider.  Do not take baths, swim, or use a hot tub until your health care provider approves.  Do not drink alcohol for 24 hours after your procedure.  Keep all follow-up visits as told by your health care provider. This is important. Contact a health care provider if:  You have redness, mild swelling, or pain around your puncture site.  You have fluid or blood coming from your puncture site that stops after applying firm pressure to the area.  Your puncture site feels warm to the touch.  You have pus or a bad smell coming from your puncture site.  You have a fever.  You have chest pain or discomfort that spreads to your neck, jaw, or arm.  You are sweating a  lot.  You feel nauseous.  You have a fast or irregular heartbeat.  You have shortness of breath.  You are dizzy or light-headed and feel the need to lie down.  You have pain or numbness in the arm or leg closest to your puncture site. Get help right away if:  Your puncture site suddenly swells.  Your puncture site is bleeding and the bleeding does not stop after applying firm pressure to the area. These symptoms may represent a serious problem that is an emergency. Do not wait to see if the symptoms will go away. Get medical  help right away. Call your local emergency services (911 in the [place] Do not drive yourself to the hospital. Summary  After the procedure, it is normal to have bruising and tenderness at the puncture site in your groin, neck, or forearm.  Check your puncture site every day for signs of infection.  Get help right away if your puncture site is bleeding and the bleeding does not stop after applying firm pressure to the area. This is a medical emergency. This information is not intended to replace advice given to you by your health care provider. Make sure you discuss any questions you have with your health care provider. Document Released: 03/09/2017 Document Revised: 03/09/2017 Document Reviewed: 03/09/2017 Elsevier Interactive Patient Education  2018 [employer].

## 2018-02-20 NOTE — Interval H&P Note (Signed)
History and Physical Interval Note:  02/20/2018 7:27 AM  Douglas Chandler  has presented today for surgery, with the diagnosis of aflutter  The various methods of treatment have been discussed with the patient and family. After consideration of risks, benefits and other options for treatment, the patient has consented to  Procedure(s): A-FLUTTER ABLATION (N/A) as a surgical intervention .  The patient's history has been reviewed, patient examined, no change in status, stable for surgery.  I have reviewed the patient's chart and labs.  Questions were answered to the patient's satisfaction.     [name]

## 2018-02-20 NOTE — Transfer of Care (Signed)
Immediate Anesthesia Transfer of Care Note  Patient: Douglas Chandler  Procedure(s) Performed: A-FLUTTER ABLATION (N/A )  Patient Location: PACU  Anesthesia Type:MAC  Level of Consciousness: awake, alert , oriented and patient cooperative  Airway & Oxygen Therapy: Patient Spontanous Breathing  Post-op Assessment: Report given to RN and Post -op Vital signs reviewed and stable  Post vital signs: Reviewed and stable  Last Vitals:  Vitals:   02/20/18 0544 02/20/18 0913  BP: (!) 145/78   Pulse: 73   Temp: 36.9 C (!) 36.4 C  SpO2: 98%     Last Pain:  Vitals:   02/20/18 0544  TempSrc: Oral         Complications: No apparent anesthesia complications

## 2018-02-20 NOTE — Progress Notes (Signed)
Site area: Right groin a 7, and 8 french was removed  Site Prior to Removal:  Level 0  Pressure Applied For 20 MINUTES    Bedrest Beginning at 0950a  Manual:   Yes.    Patient Status During Pull:  stable  Post Pull Groin Site:  Level 0  Post Pull Instructions Given:  Yes.    Post Pull Pulses Present:  Yes.    Dressing Applied:  Yes.    Comments:  VS remain stable during sheath pull.

## 2018-02-20 NOTE — Anesthesia Procedure Notes (Signed)
Procedure Name: MAC Date/Time: 02/20/2018 7:40 AM Performed by: White, Amedeo Plenty, CRNA Pre-anesthesia Checklist: Patient identified, Emergency Drugs available, Suction available and Patient being monitored Oxygen Delivery Method: Simple face mask

## 2018-02-20 NOTE — Anesthesia Postprocedure Evaluation (Signed)
Anesthesia Post Note  Patient: Douglas Chandler  Procedure(s) Performed: A-FLUTTER ABLATION (N/A )     Patient location during evaluation: Cath Lab Anesthesia Type: MAC Level of consciousness: awake and alert, patient cooperative and oriented Pain management: pain level controlled Vital Signs Assessment: post-procedure vital signs reviewed and stable Respiratory status: spontaneous breathing, nonlabored ventilation, respiratory function stable and patient connected to nasal cannula oxygen Cardiovascular status: blood pressure returned to baseline and stable Postop Assessment: no apparent nausea or vomiting Anesthetic complications: no    Last Vitals:  Vitals:   02/20/18 0940 02/20/18 1018  BP: 113/71   Pulse: 66   Resp: 19   Temp:  36.7 C  SpO2: 95%     Last Pain:  Vitals:   02/20/18 1018  TempSrc: Oral                 Shawnae Leiva,E. Carmencita Cusic

## 2018-02-20 NOTE — Progress Notes (Signed)
Doing very well s/p atrial flutter ablation No concerns VSS Groin without hematoma/ bruit  Plan is to DC to home at 3:40 pm today Continue anticoagulation (resume eliquis tonight) No medicine changes Routine groin care  Follow-up with me in 4 weeks  [name] MD, [facility] 02/20/2018 11:46 AM

## 2018-02-21 ENCOUNTER — Encounter (HOSPITAL_COMMUNITY): Payer: Self-pay | Admitting: Internal Medicine

## 2018-02-21 ENCOUNTER — Telehealth: Payer: Self-pay | Admitting: Internal Medicine

## 2018-02-21 NOTE — Telephone Encounter (Signed)
Pt made aware of lab results per Dr Rayann Heman.  Pt verbalized understanding.

## 2018-02-21 NOTE — Telephone Encounter (Signed)
Patient calling, returning call for lab results.

## 2018-03-26 ENCOUNTER — Ambulatory Visit (INDEPENDENT_AMBULATORY_CARE_PROVIDER_SITE_OTHER): Payer: BLUE CROSS/BLUE SHIELD | Admitting: Internal Medicine

## 2018-03-26 ENCOUNTER — Encounter: Payer: Self-pay | Admitting: Internal Medicine

## 2018-03-26 VITALS — BP 124/70 | HR 68 | Ht 76.0 in | Wt 290.0 lb

## 2018-03-26 DIAGNOSIS — Z9989 Dependence on other enabling machines and devices: Secondary | ICD-10-CM

## 2018-03-26 DIAGNOSIS — I10 Essential (primary) hypertension: Secondary | ICD-10-CM

## 2018-03-26 DIAGNOSIS — I483 Typical atrial flutter: Secondary | ICD-10-CM

## 2018-03-26 DIAGNOSIS — G4733 Obstructive sleep apnea (adult) (pediatric): Secondary | ICD-10-CM | POA: Diagnosis not present

## 2018-03-26 MED ORDER — METOPROLOL TARTRATE 50 MG PO TABS: 25.0000 mg | ORAL_TABLET | Freq: Two times a day (BID) | ORAL | 3 refills | Status: DC

## 2018-03-26 NOTE — Patient Instructions (Addendum)
Medication Instructions:  Your physician has recommended you make the following change in your medication: 1.  Stop taking Eliquis 2.  Reduce your metoprolol 50 mg to 1/2 tablet by mouth twice a day for 6 weeks 3.  After 6 weeks you may discontinue your metoprolol  Labwork: None ordered.  Testing/Procedures: None ordered.  Follow-Up: Your physician wants you to follow-up in: as needed with Dr. Rayann Heman.     Any Other Special Instructions Will Be Listed Below (If Applicable).  If you need a refill on your cardiac medications before your next appointment, please call your pharmacy.
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
## 2018-05-14 ENCOUNTER — Ambulatory Visit (INDEPENDENT_AMBULATORY_CARE_PROVIDER_SITE_OTHER): Payer: BLUE CROSS/BLUE SHIELD | Admitting: Family Medicine

## 2018-05-14 ENCOUNTER — Encounter: Payer: Self-pay | Admitting: Family Medicine

## 2018-05-14 VITALS — BP 110/80 | HR 87 | Temp 98.4°F | Wt 287.5 lb

## 2018-05-14 DIAGNOSIS — J029 Acute pharyngitis, unspecified: Secondary | ICD-10-CM | POA: Diagnosis not present

## 2018-05-14 DIAGNOSIS — H6501 Acute serous otitis media, right ear: Secondary | ICD-10-CM | POA: Diagnosis not present

## 2018-05-14 MED ORDER — FLUTICASONE PROPIONATE 50 MCG/ACT NA SUSP: 1.0000 | Freq: Every day | NASAL | 3 refills | Status: DC

## 2018-05-14 MED ORDER — LEVOCETIRIZINE DIHYDROCHLORIDE 5 MG PO TABS: 5.0000 mg | ORAL_TABLET | Freq: Every evening | ORAL | 3 refills | Status: DC

## 2018-05-14 NOTE — Patient Instructions (Signed)
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
## 2018-11-22 ENCOUNTER — Ambulatory Visit (INDEPENDENT_AMBULATORY_CARE_PROVIDER_SITE_OTHER): Payer: BLUE CROSS/BLUE SHIELD | Admitting: Internal Medicine

## 2018-11-22 ENCOUNTER — Encounter: Payer: Self-pay | Admitting: Internal Medicine

## 2018-11-22 VITALS — BP 140/80 | HR 68 | Ht 76.0 in | Wt 282.0 lb

## 2018-11-22 DIAGNOSIS — R002 Palpitations: Secondary | ICD-10-CM | POA: Diagnosis not present

## 2018-11-22 DIAGNOSIS — R0602 Shortness of breath: Secondary | ICD-10-CM

## 2018-11-22 DIAGNOSIS — Z9989 Dependence on other enabling machines and devices: Secondary | ICD-10-CM

## 2018-11-22 DIAGNOSIS — I10 Essential (primary) hypertension: Secondary | ICD-10-CM | POA: Diagnosis not present

## 2018-11-22 DIAGNOSIS — G4733 Obstructive sleep apnea (adult) (pediatric): Secondary | ICD-10-CM | POA: Diagnosis not present

## 2018-11-22 MED ORDER — METOPROLOL TARTRATE 25 MG PO TABS: 25.0000 mg | ORAL_TABLET | Freq: Four times a day (QID) | ORAL | 3 refills | Status: DC | PRN

## 2018-11-22 NOTE — Patient Instructions (Addendum)
Medication Instructions:  Your physician has recommended you make the following change in your medication:   1.  Take metoprolol tartrate 25 mg---Take one tablet by mouth every 6 hours as needed for palpitations.  Labwork: None ordered.  Testing/Procedures: Your physician has requested that you have an exercise tolerance test. For further information please visit [email or web address]. Please also follow instruction sheet, as given.  Please schedule for a treadmill stress test.  Follow-Up: Your physician wants you to follow-up in: 6 weeks with an EP APP.   Any Other Special Instructions Will Be Listed Below (If Applicable).  If you need a refill on your cardiac medications before your next appointment, please call your pharmacy.
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
## 2018-12-01 ENCOUNTER — Other Ambulatory Visit: Payer: Self-pay | Admitting: Adult Health

## 2018-12-03 ENCOUNTER — Other Ambulatory Visit: Payer: Self-pay | Admitting: Adult Health

## 2018-12-03 NOTE — Telephone Encounter (Signed)
Denied.  Pt needs cpx and lab work.

## 2018-12-06 ENCOUNTER — Encounter: Payer: Self-pay | Admitting: Physician Assistant

## 2018-12-06 ENCOUNTER — Other Ambulatory Visit: Payer: Self-pay | Admitting: Adult Health

## 2018-12-10 ENCOUNTER — Ambulatory Visit (INDEPENDENT_AMBULATORY_CARE_PROVIDER_SITE_OTHER): Payer: BLUE CROSS/BLUE SHIELD

## 2018-12-10 DIAGNOSIS — R002 Palpitations: Secondary | ICD-10-CM

## 2018-12-10 DIAGNOSIS — R0602 Shortness of breath: Secondary | ICD-10-CM | POA: Diagnosis not present

## 2018-12-10 LAB — EXERCISE TOLERANCE TEST
CHL CUP RESTING HR STRESS: 87 {beats}/min
CSEPED: 8 min
CSEPEW: 10.4 METS
CSEPPHR: 139 {beats}/min
Exercise duration (sec): 31 s
MPHR: 160 {beats}/min
Percent HR: 87 %
RPE: 17

## 2018-12-19 ENCOUNTER — Telehealth: Payer: Self-pay

## 2018-12-19 NOTE — Telephone Encounter (Signed)
Follow up  ° ° °Patient is returning call in reference to lab results.  °

## 2018-12-19 NOTE — Telephone Encounter (Signed)
-----   Message from [name], MD sent at 12/15/2018  6:54 PM EST ----- Results reviewed.  Douglas Chandler, please inform pt of result. I will route to primary care also.

## 2018-12-19 NOTE — Telephone Encounter (Signed)
Outreach made to both phone numbers.  Left VM on cell phone requesting call back.  Results of stress test.

## 2018-12-20 NOTE — Telephone Encounter (Signed)
Returned call to Pt.  Discussed results of stress test.  Confirmed follow up with AS.

## 2019-01-04 ENCOUNTER — Encounter: Payer: Self-pay | Admitting: Nurse Practitioner

## 2019-01-04 ENCOUNTER — Ambulatory Visit (INDEPENDENT_AMBULATORY_CARE_PROVIDER_SITE_OTHER): Payer: BLUE CROSS/BLUE SHIELD | Admitting: Nurse Practitioner

## 2019-01-04 VITALS — BP 130/78 | HR 60 | Ht 76.0 in | Wt 289.2 lb

## 2019-01-04 DIAGNOSIS — I10 Essential (primary) hypertension: Secondary | ICD-10-CM | POA: Diagnosis not present

## 2019-01-04 DIAGNOSIS — Z9989 Dependence on other enabling machines and devices: Secondary | ICD-10-CM | POA: Diagnosis not present

## 2019-01-04 DIAGNOSIS — I493 Ventricular premature depolarization: Secondary | ICD-10-CM

## 2019-01-04 DIAGNOSIS — G4733 Obstructive sleep apnea (adult) (pediatric): Secondary | ICD-10-CM | POA: Diagnosis not present

## 2019-01-04 NOTE — Patient Instructions (Addendum)
Medication Instructions:   Your physician recommends that you continue on your current medications as directed. Please refer to the Current Medication list given to you today.   If you need a refill on your cardiac medications before your next appointment, please call your pharmacy.   Lab work: NONE ORDERED  TODAY    If you have labs (blood work) drawn today and your tests are completely normal, you will receive your results only by: Marland Kitchen MyChart Message (if you have MyChart) OR . A paper copy in the mail If you have any lab test that is abnormal or we need to change your treatment, we will call you to review the results.   Testing/Procedures: NONE ORDERED  TODAY   Follow-Up:  At [facility], you and your health needs are our priority.  As part of our continuing mission to provide you with exceptional heart care, we have created designated Provider Care Teams.  These Care Teams include your primary Cardiologist (physician) and Advanced Practice Providers (APPs -  Physician Assistants and Nurse Practitioners) who all work together to provide you with the care you need, when you need it. You will need a follow up appointment in  A year  Please call our office 2 months in advance to schedule this appointment.  You may see None  or one of the following Advanced Practice Providers on your designated Care Team:   Chanetta Marshall, NP . Tommye Standard, PA-C  Any Other Special Instructions Will Be Listed Below (If Applicable).
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
## 2019-10-01 ENCOUNTER — Telehealth: Payer: Self-pay | Admitting: Adult Health

## 2019-10-01 ENCOUNTER — Telehealth (INDEPENDENT_AMBULATORY_CARE_PROVIDER_SITE_OTHER): Payer: BC Managed Care – PPO | Admitting: Adult Health

## 2019-10-01 ENCOUNTER — Other Ambulatory Visit: Payer: Self-pay

## 2019-10-01 DIAGNOSIS — K047 Periapical abscess without sinus: Secondary | ICD-10-CM | POA: Diagnosis not present

## 2019-10-01 MED ORDER — PENICILLIN V POTASSIUM 500 MG PO TABS: 500.0000 mg | ORAL_TABLET | Freq: Three times a day (TID) | ORAL | 0 refills | Status: AC

## 2019-10-01 NOTE — Telephone Encounter (Signed)
Pt has been scheduled for VV 

## 2019-10-01 NOTE — Telephone Encounter (Signed)
Pt called to request rx for pain. Pt is unable to see a dentist until January and is having pain associated with a tooth. Please advise.  [place] [place] [address], [place] [phone] (Phone) [phone] (Fax)     [phone]

## 2019-10-01 NOTE — Telephone Encounter (Signed)
Pt has not been seen in >1 year. Will need visit to discuss.  LMTCB to schedule
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
## 2019-10-01 NOTE — Telephone Encounter (Signed)
Patient requesting call back from [facility] [place]. Patient declined appointment at this time.

## 2019-10-02 ENCOUNTER — Ambulatory Visit: Payer: BLUE CROSS/BLUE SHIELD | Admitting: Adult Health

## 2020-01-02 ENCOUNTER — Ambulatory Visit (INDEPENDENT_AMBULATORY_CARE_PROVIDER_SITE_OTHER): Payer: BC Managed Care – PPO | Admitting: Adult Health

## 2020-01-02 ENCOUNTER — Encounter: Payer: Self-pay | Admitting: Adult Health

## 2020-01-02 ENCOUNTER — Other Ambulatory Visit: Payer: Self-pay

## 2020-01-02 VITALS — BP 140/76 | HR 87 | Temp 97.4°F | Ht 73.5 in | Wt 276.0 lb

## 2020-01-02 DIAGNOSIS — R002 Palpitations: Secondary | ICD-10-CM | POA: Diagnosis not present

## 2020-01-02 DIAGNOSIS — Z0000 Encounter for general adult medical examination without abnormal findings: Secondary | ICD-10-CM

## 2020-01-02 DIAGNOSIS — I10 Essential (primary) hypertension: Secondary | ICD-10-CM | POA: Diagnosis not present

## 2020-01-02 DIAGNOSIS — Z23 Encounter for immunization: Secondary | ICD-10-CM | POA: Diagnosis not present

## 2020-01-02 DIAGNOSIS — Z125 Encounter for screening for malignant neoplasm of prostate: Secondary | ICD-10-CM

## 2020-01-02 DIAGNOSIS — G4733 Obstructive sleep apnea (adult) (pediatric): Secondary | ICD-10-CM

## 2020-01-02 LAB — CBC WITH DIFFERENTIAL/PLATELET
Basophils Absolute: 0.1 10*3/uL (ref 0.0–0.1)
Basophils Relative: 1.2 % (ref 0.0–3.0)
Eosinophils Absolute: 0.1 10*3/uL (ref 0.0–0.7)
Eosinophils Relative: 1.7 % (ref 0.0–5.0)
HCT: 50.7 % (ref 39.0–52.0)
Hemoglobin: 17 g/dL (ref 13.0–17.0)
Lymphocytes Relative: 23.9 % (ref 12.0–46.0)
Lymphs Abs: 2 10*3/uL (ref 0.7–4.0)
MCHC: 33.6 g/dL (ref 30.0–36.0)
MCV: 95.1 fl (ref 78.0–100.0)
Monocytes Absolute: 0.7 10*3/uL (ref 0.1–1.0)
Monocytes Relative: 8.6 % (ref 3.0–12.0)
Neutro Abs: 5.3 10*3/uL (ref 1.4–7.7)
Neutrophils Relative %: 64.6 % (ref 43.0–77.0)
Platelets: 237 10*3/uL (ref 150.0–400.0)
RBC: 5.33 Mil/uL (ref 4.22–5.81)
RDW: 13.2 % (ref 11.5–15.5)
WBC: 8.2 10*3/uL (ref 4.0–10.5)

## 2020-01-02 LAB — COMPREHENSIVE METABOLIC PANEL
ALT: 30 U/L (ref 0–53)
AST: 26 U/L (ref 0–37)
Albumin: 4.7 g/dL (ref 3.5–5.2)
Alkaline Phosphatase: 54 U/L (ref 39–117)
BUN: 15 mg/dL (ref 6–23)
CO2: 27 mEq/L (ref 19–32)
Calcium: 10.1 mg/dL (ref 8.4–10.5)
Chloride: 105 mEq/L (ref 96–112)
Creatinine, Ser: 0.98 mg/dL (ref 0.40–1.50)
GFR: 77.74 mL/min (ref >60.00)
Glucose, Bld: 92 mg/dL (ref 70–99)
Potassium: 4.3 mEq/L (ref 3.5–5.1)
Sodium: 140 mEq/L (ref 135–145)
Total Bilirubin: 1.3 mg/dL — ABNORMAL HIGH (ref 0.2–1.2)
Total Protein: 7.6 g/dL (ref 6.0–8.3)

## 2020-01-02 LAB — LIPID PANEL
Cholesterol: 161 mg/dL (ref 0–200)
HDL: 33.5 mg/dL — ABNORMAL LOW (ref >39.00)
LDL Cholesterol: 106 mg/dL — ABNORMAL HIGH (ref 0–99)
NonHDL: 127.32
Total CHOL/HDL Ratio: 5
Triglycerides: 107 mg/dL (ref 0.0–149.0)
VLDL: 21.4 mg/dL (ref 0.0–40.0)

## 2020-01-02 LAB — TSH: TSH: 1.74 u[IU]/mL (ref 0.35–4.50)

## 2020-01-02 LAB — PSA: PSA: 0.25 ng/mL (ref 0.10–4.00)

## 2020-01-02 MED ORDER — FLUTICASONE PROPIONATE 50 MCG/ACT NA SUSP: 2.0000 | Freq: Every day | NASAL | 6 refills | Status: DC | PRN

## 2020-01-02 NOTE — Patient Instructions (Addendum)
It was great seeing you today   I will follow up with you regarding your blood work   You have lost 13 pounds! Keep up the good work!   Please let me know if you need anything
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
## 2020-01-03 ENCOUNTER — Other Ambulatory Visit: Payer: Self-pay | Admitting: Adult Health

## 2020-01-03 MED ORDER — MELOXICAM 15 MG PO TABS: 15.0000 mg | ORAL_TABLET | Freq: Every day | ORAL | 1 refills | Status: DC

## 2020-01-07 ENCOUNTER — Telehealth: Payer: Self-pay | Admitting: Adult Health

## 2020-01-07 NOTE — Telephone Encounter (Signed)
Pt spoke to a nurse yesterday regarding the rash he developed. Per pt, he thinks it's a medication reaction and would like to speak to a nurse. Thanks

## 2020-01-08 ENCOUNTER — Encounter: Payer: Self-pay | Admitting: Adult Health

## 2020-01-08 NOTE — Telephone Encounter (Signed)
Spoke to pt and he stated that he has gotten a generalized " red rash " pt stated that the rash has gotten better and still " a little itchy". Pt thinks that the rash may have come from the medication. Pt denies using any new soaps, lotions and detergents.

## 2020-01-08 NOTE — Telephone Encounter (Signed)
FYI Spoke to pt and stated that he has a generalized rash. Pt has been scheduled for VV. Pt will upload pic on Mychart before his appt. Pt will set up Mychart.

## 2020-01-09 ENCOUNTER — Other Ambulatory Visit: Payer: Self-pay

## 2020-01-09 ENCOUNTER — Telehealth (INDEPENDENT_AMBULATORY_CARE_PROVIDER_SITE_OTHER): Payer: BC Managed Care – PPO | Admitting: Adult Health

## 2020-01-09 DIAGNOSIS — L309 Dermatitis, unspecified: Secondary | ICD-10-CM

## 2020-01-09 MED ORDER — PREDNISONE 20 MG PO TABS: 20.0000 mg | ORAL_TABLET | Freq: Every day | ORAL | 0 refills | Status: DC
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
## 2020-01-14 ENCOUNTER — Encounter: Payer: Self-pay | Admitting: Adult Health

## 2020-01-14 ENCOUNTER — Other Ambulatory Visit: Payer: Self-pay

## 2020-01-14 ENCOUNTER — Telehealth (INDEPENDENT_AMBULATORY_CARE_PROVIDER_SITE_OTHER): Payer: BC Managed Care – PPO | Admitting: Adult Health

## 2020-01-14 DIAGNOSIS — L271 Localized skin eruption due to drugs and medicaments taken internally: Secondary | ICD-10-CM

## 2020-01-14 DIAGNOSIS — M1991 Primary osteoarthritis, unspecified site: Secondary | ICD-10-CM | POA: Diagnosis not present

## 2020-01-14 MED ORDER — NAPROXEN 500 MG PO TABS: 500.0000 mg | ORAL_TABLET | Freq: Two times a day (BID) | ORAL | 1 refills | Status: DC

## 2020-01-14 NOTE — Progress Notes (Signed)
Virtual Visit via Telephone Note  I connected with Douglas Chandler on 01/14/20 at  3:30 PM EST by telephone and verified that I am speaking with the correct person using two identifiers.   I discussed the limitations, risks, security and privacy concerns of performing an evaluation and management service by telephone and the availability of in person appointments. I also discussed with the patient that there may be a patient responsible charge related to this service. The patient expressed understanding and agreed to proceed.  Location patient: home Location provider: work or home office Participants present for the call: patient, provider Patient did not have a visit in the prior 7 days to address this/these issue(s).   History of Present Illness: Follow-up for dermatitis versus acute drug eruption.  He had a generalized rash that he experienced approximately 3 days after starting Mobic.  He stopped the medication and the rash started to resolve.  He was placed on a short course of prednisone 5 days ago and reports that his symptoms improved and the rash pretty much resolved, over the weekend he restarted the Mobic as he was instructed to do so and the rash came back.  He woke up days ago and he had a red rash and itching on his torso and lower extremities.  Since then he has stopped taking Mobic again and the rashes started to resolve.  Unfortunantly Mobic was helping with his arthritic pain  He is able to take ibuprofen and Advil without any issues.   Observations/Objective: Patient sounds cheerful and well on the phone. I do not appreciate any SOB. Speech and thought processing are grossly intact. Patient reported vitals:  Assessment and Plan: 1. Drug eruption, fixed -Likely due to Mobic.  We will have him stop this medication.  We will place him on naproxen.  Advise follow-up as needed  2. Primary osteoarthritis, unspecified site  - naproxen (NAPROSYN) 500 MG tablet; Take 1 tablet  (500 mg total) by mouth 2 (two) times daily with a meal.  Dispense: 60 tablet; Refill: 1   Follow Up Instructions:  I did not refer this patient for an OV in the next 24 hours for this/these issue(s).  I discussed the assessment and treatment plan with the patient. The patient was provided an opportunity to ask questions and all were answered. The patient agreed with the plan and demonstrated an understanding of the instructions.   The patient was advised to call back or seek an in-person evaluation if the symptoms worsen or if the condition fails to improve as anticipated.  I provided 15 minutes of non-face-to-face time during this encounter.   [name], NP

## 2020-01-15 ENCOUNTER — Other Ambulatory Visit: Payer: Self-pay

## 2020-01-15 ENCOUNTER — Other Ambulatory Visit: Payer: BC Managed Care – PPO

## 2020-01-15 ENCOUNTER — Ambulatory Visit (INDEPENDENT_AMBULATORY_CARE_PROVIDER_SITE_OTHER): Payer: BC Managed Care – PPO

## 2020-01-15 ENCOUNTER — Telehealth (INDEPENDENT_AMBULATORY_CARE_PROVIDER_SITE_OTHER): Payer: BC Managed Care – PPO | Admitting: Adult Health

## 2020-01-15 DIAGNOSIS — R0609 Other forms of dyspnea: Secondary | ICD-10-CM

## 2020-01-15 DIAGNOSIS — R0600 Dyspnea, unspecified: Secondary | ICD-10-CM

## 2020-01-15 NOTE — Progress Notes (Signed)
Virtual Visit via Telephone Note  I connected with Douglas Chandler on 01/15/20 at 10:00 AM EST by telephone and verified that I am speaking with the correct person using two identifiers.   I discussed the limitations, risks, security and privacy concerns of performing an evaluation and management service by telephone and the availability of in person appointments. I also discussed with the patient that there may be a patient responsible charge related to this service. The patient expressed understanding and agreed to proceed.  Location patient: home Location provider: work or home office Participants present for the call: patient, provider Patient did not have a visit in the prior 7 days to address this/these issue(s).   History of Present Illness: 64 year old male who is being evaluated today for shortness of breath.  He was seen via virtual visit yesterday for a drug eruption that was clearing.  After he got off the phone with me he was at work walking up stairs and noticed that he was becoming short of breath is abnormal for him.  3:30 this morning he had to wake up to place an extra pillow underneath of himself because he was feeling slightly short of breath while laying on his left side this resolved.  Today while at work he has noticed that he continues to have mild shortness of breath with exertion such as walking and climbing stairs.  He denies fevers or chills has no cough, body aches, elevation in blood pressure or pulse.  He also denies palpitations.  Does not have a history of DVT or PE but has a history of double lung pneumonia 2010 and reports that his symptoms are similar to when that started.  Denies shortness of breath while at rest   Observations/Objective: Patient sounds cheerful and well on the phone. I do not appreciate any SOB for acute distress Speech and thought processing are grossly intact. Patient reported vitals:  Assessment and Plan: 1. DOE (dyspnea on exertion) -  Doubt Covid or PE.  Will get chest x-ray to rule out pneumonia, possible bronchitis.  Consider CT in the future. - DG Chest 2 View; Future   Follow Up Instructions:   I did not refer this patient for an OV in the next 24 hours for this/these issue(s).  I discussed the assessment and treatment plan with the patient. The patient was provided an opportunity to ask questions and all were answered. The patient agreed with the plan and demonstrated an understanding of the instructions.   The patient was advised to call back or seek an in-person evaluation if the symptoms worsen or if the condition fails to improve as anticipated.  I provided 20 minutes of non-face-to-face time during this encounter.   [name], NP

## 2020-01-16 ENCOUNTER — Telehealth: Payer: Self-pay | Admitting: Adult Health

## 2020-01-16 DIAGNOSIS — I517 Cardiomegaly: Secondary | ICD-10-CM

## 2020-01-16 MED ORDER — FUROSEMIDE 20 MG PO TABS: 20.0000 mg | ORAL_TABLET | Freq: Every day | ORAL | 0 refills | Status: DC

## 2020-01-16 NOTE — Telephone Encounter (Signed)
Updated patient on his x-ray which showed cardiomegaly and interstitial edema.  Reports that he is breathing easier today but when he does move he gets some mild shortness of breath.  Will send in Lasix and for him over to cardiology.

## 2020-01-16 NOTE — Telephone Encounter (Signed)
Pt is calling in reference to his x-ray results, he would like follow up of their findings.
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
## 2020-01-20 ENCOUNTER — Ambulatory Visit (INDEPENDENT_AMBULATORY_CARE_PROVIDER_SITE_OTHER): Payer: BC Managed Care – PPO | Admitting: Physician Assistant

## 2020-01-20 ENCOUNTER — Other Ambulatory Visit: Payer: Self-pay

## 2020-01-20 VITALS — BP 172/100 | HR 112 | Ht 73.5 in | Wt 281.0 lb

## 2020-01-20 DIAGNOSIS — R0609 Other forms of dyspnea: Secondary | ICD-10-CM

## 2020-01-20 DIAGNOSIS — I493 Ventricular premature depolarization: Secondary | ICD-10-CM

## 2020-01-20 DIAGNOSIS — Z79899 Other long term (current) drug therapy: Secondary | ICD-10-CM | POA: Diagnosis not present

## 2020-01-20 DIAGNOSIS — R0600 Dyspnea, unspecified: Secondary | ICD-10-CM

## 2020-01-20 DIAGNOSIS — I4891 Unspecified atrial fibrillation: Secondary | ICD-10-CM

## 2020-01-20 DIAGNOSIS — I10 Essential (primary) hypertension: Secondary | ICD-10-CM

## 2020-01-20 LAB — BASIC METABOLIC PANEL
BUN/Creatinine Ratio: 10 (ref 10–24)
BUN: 11 mg/dL (ref 8–27)
CO2: 23 mmol/L (ref 20–29)
Calcium: 9.5 mg/dL (ref 8.6–10.2)
Chloride: 101 mmol/L (ref 96–106)
Creatinine, Ser: 1.06 mg/dL (ref 0.76–1.27)
GFR calc Af Amer: 87 mL/min/{1.73_m2} (ref >59)
GFR calc non Af Amer: 75 mL/min/{1.73_m2} (ref >59)
Glucose: 89 mg/dL (ref 65–99)
Potassium: 4.4 mmol/L (ref 3.5–5.2)
Sodium: 140 mmol/L (ref 134–144)

## 2020-01-20 LAB — CBC
Hematocrit: 48.5 % (ref 37.5–51.0)
Hemoglobin: 16.7 g/dL (ref 13.0–17.7)
MCH: 31.7 pg (ref 26.6–33.0)
MCHC: 34.4 g/dL (ref 31.5–35.7)
MCV: 92 fL (ref 79–97)
Platelets: 218 10*3/uL (ref 150–450)
RBC: 5.27 x10E6/uL (ref 4.14–5.80)
RDW: 12.2 % (ref 11.6–15.4)
WBC: 7.9 10*3/uL (ref 3.4–10.8)

## 2020-01-20 MED ORDER — APIXABAN 5 MG PO TABS: 5.0000 mg | ORAL_TABLET | Freq: Two times a day (BID) | ORAL | 6 refills | Status: DC

## 2020-01-20 MED ORDER — METOPROLOL SUCCINATE ER 50 MG PO TB24: 50.0000 mg | ORAL_TABLET | Freq: Every day | ORAL | 2 refills | Status: DC

## 2020-01-20 NOTE — Patient Instructions (Addendum)
Medication Instructions:   START TAKING TOPROL XL 50 MG ONCE A DAY   START TAKING ELIQUIS  5 MG TWICE A DAY   *If you need a refill on your cardiac medications before your next appointment, please call your pharmacy*  Lab Work: BMET AND CBC TODAY   If you have labs (blood work) drawn today and your tests are completely normal, you will receive your results only by: Marland Kitchen MyChart Message (if you have MyChart) OR . A paper copy in the mail If you have any lab test that is abnormal or we need to change your treatment, we will call you to review the results.  Testing/Procedures:  Your physician has recommended that you wear an event monitor. Event monitors are medical devices that record the heart's electrical activity. Doctors most often [place] these monitors to diagnose arrhythmias. Arrhythmias are problems with the speed or rhythm of the heartbeat. The monitor is a small, portable device. You can wear one while you do your normal daily activities. This is usually used to diagnose what is causing palpitations/syncope (passing out).   Your physician has requested that you have an echocardiogram. Echocardiography is a painless test that uses sound waves to create images of your heart. It provides your doctor with information about the size and shape of your heart and how well your heart's chambers and valves are working. This procedure takes approximately one hour. There are no restrictions for this procedure.   Follow-Up: At [facility], you and your health needs are our priority.  As part of our continuing mission to provide you with exceptional heart care, we have created designated Provider Care Teams.  These Care Teams include your primary Cardiologist (physician) and Advanced Practice Providers (APPs -  Physician Assistants and Nurse Practitioners) who all work together to provide you with the care you need, when you need it.  Your next appointment:   3-4 week(s)  The format for your next  appointment:   AFIB CLINIC    Other Instructions  [place] Monitor Instructions   Your physician has requested you wear your ZIO patch monitor__14_____days.   This is a single patch monitor.  [employer] supplies one patch monitor per enrollment.  Additional stickers are not available.   Please do not apply patch if you will be having a Nuclear Stress Test, Echocardiogram, Cardiac CT, MRI, or Chest Xray during the time frame you would be wearing the monitor. The patch cannot be worn during these tests.  You cannot remove and re-apply the ZIO XT patch monitor.   Your ZIO patch monitor will be sent USPS Priority mail from [facility] directly to your home address. The monitor may also be mailed to a PO BOX if home delivery is not available.   It may take 3-5 days to receive your monitor after you have been enrolled.   Once you have received you monitor, please review enclosed instructions.  Your monitor has already been registered assigning a specific monitor serial # to you.   Applying the monitor   Shave hair from upper left chest.   Hold abrader disc by orange tab.  Rub abrader in 40 strokes over left upper chest as indicated in your monitor instructions.   Clean area with 4 enclosed alcohol pads .  Use all pads to assure are is cleaned thoroughly.  Let dry.   Apply patch as indicated in monitor instructions.  Patch will be place under collarbone on left side of chest with  arrow pointing upward.   Rub patch adhesive wings for 2 minutes.Remove white label marked "1".  Remove white label marked "2".  Rub patch adhesive wings for 2 additional minutes.   While looking in a mirror, press and release button in center of patch.  A small green light will flash 3-4 times .  This will be your only indicator the monitor has been turned on.     Do not shower for the first 24 hours.  You may shower after the first 24 hours.   Press button if you feel a symptom. You will hear a  small click.  Record Date, Time and Symptom in the Patient Log Book.   When you are ready to remove patch, follow instructions on last 2 pages of Patient Log Book.  Stick patch monitor onto last page of Patient Log Book.   Place Patient Log Book in [place] box.  Use locking tab on box and tape box closed securely.  The [employer] and [employer] has [employer] on it.  Please place in mailbox as soon as possible.  Your physician should have your test results approximately 7 days after the monitor has been mailed back to [facility].   Call [place] at [phone] if you have questions regarding your ZIO XT patch monitor.  Call them immediately if you see an orange light blinking on your monitor.   If your monitor falls off in less than 4 days contact our Monitor department at [phone].  If your monitor becomes loose or falls off after 4 days call [employer] at [phone] for suggestions on securing your monitor.

## 2020-01-22 ENCOUNTER — Telehealth: Payer: Self-pay | Admitting: Radiology

## 2020-01-22 NOTE — Telephone Encounter (Signed)
Enrolled patient for a 14 day Zio monitor to be mailed to patients home.  

## 2020-01-27 ENCOUNTER — Other Ambulatory Visit (INDEPENDENT_AMBULATORY_CARE_PROVIDER_SITE_OTHER): Payer: BC Managed Care – PPO

## 2020-01-27 DIAGNOSIS — I493 Ventricular premature depolarization: Secondary | ICD-10-CM

## 2020-01-27 DIAGNOSIS — I4891 Unspecified atrial fibrillation: Secondary | ICD-10-CM

## 2020-02-03 ENCOUNTER — Telehealth: Payer: Self-pay | Admitting: Internal Medicine

## 2020-02-03 NOTE — Telephone Encounter (Signed)
Pt c/o Shortness Of Breath: STAT if SOB developed within the last 24 hours or pt is noticeably SOB on the phone  1. Are you currently SOB (can you hear that pt is SOB on the phone)? no  2. How long have you been experiencing SOB? Started Saturday morning 02/01/20. He went grocery shopping and noticed it then.     3. Are you SOB when sitting or when up moving around? Up moving around. He states that the SOB is when he exerts himself. He cant walk very far. When he bends down or leans over it feels like it cuts off his airway and he can feel pressure it his head.   4. Are you currently experiencing any other symptoms? No. Denies chest pain, lightheadedness, dizziness. Does not have BP or HR for today. 127/78 (somewhere in this range) yesterday. Patient states that the SOB is a weird feeling. He feel's like it is something like he had back in 2010 when he had a disease (unsure of the name) that he got while at work but was eventually cleared from it.

## 2020-02-03 NOTE — Telephone Encounter (Signed)
Returned call to Pt.  Pt is concerned because of his shortness of breath with exertion.  Provided further education on afib and why we cannot cardiovert Pt right away.  Pt asked if ok to take his old prescription of metoprolol tartrate.  Advised he was now taking Toprol XL, and he should not take the tartrate.  Advised to take his pulse and call office if still elevated because possible to adjust his Toprol XL.  Pt indicates understanding.  Will call back this afternoon with his BP and pulse.

## 2020-02-07 ENCOUNTER — Other Ambulatory Visit (HOSPITAL_COMMUNITY): Payer: BC Managed Care – PPO

## 2020-02-10 ENCOUNTER — Ambulatory Visit (HOSPITAL_COMMUNITY): Payer: BC Managed Care – PPO | Attending: Cardiology

## 2020-02-10 ENCOUNTER — Other Ambulatory Visit: Payer: Self-pay

## 2020-02-10 ENCOUNTER — Telehealth: Payer: Self-pay | Admitting: Physician Assistant

## 2020-02-10 DIAGNOSIS — I493 Ventricular premature depolarization: Secondary | ICD-10-CM | POA: Insufficient documentation

## 2020-02-10 DIAGNOSIS — I4891 Unspecified atrial fibrillation: Secondary | ICD-10-CM | POA: Insufficient documentation

## 2020-02-10 MED ORDER — PERFLUTREN LIPID MICROSPHERE
1.0000 mL | INTRAVENOUS | Status: AC | PRN
  Administered 2020-02-10: 14:00:00 1 mL via INTRAVENOUS

## 2020-02-10 NOTE — Telephone Encounter (Signed)
I spoke with patient. He reports mild swelling in bilateral feet and ankles.  Has never had this before. Started Saturday. Does not weigh daily. Continues to have shortness of breath.Feels it is the same as last week.  Worse with exertion. He is able to work but gets short of breath. Slept in recliner over weekend due to shortness of breath. Was on lasix 20 mg daily earlier this month based on chest x-ray report.  This was prescribed by primary care. Per previous phone note echo appointment to be moved prior to afib clinic appointment on 3/2 if possible. Patient feels he would be able to lie still for echo. He is available today.  Echo moved to 1:50 today. He will follow up with Afib clinic as planned tomorrow.  March 8 appointment with Tommye Standard, PA moved to April 6,2021 at 8:00

## 2020-02-10 NOTE — Telephone Encounter (Signed)
New Message    Pt c/o swelling: STAT is pt has developed SOB within 24 hours  1) How much weight have you gained and in what time span? He thinks so   2) If swelling, where is the swelling located? Swelling around both ankles and feet   3) Are you currently taking a fluid pill? No   4) Are you currently SOB? When sitting still, no SOB but when he is moving around he will get SOB    Pt says he has been experiencing this for over a  week, he doesn't feel like he can work with  having the SOB   5) Do you have a log of your daily weights (if so, list)? No   6) Have you gained 3 pounds in a day or 5 pounds in a week? Feels like he has gained a little bit   7) Have you traveled recently? No

## 2020-02-11 ENCOUNTER — Ambulatory Visit (HOSPITAL_COMMUNITY)
Admission: RE | Admit: 2020-02-11 | Discharge: 2020-02-11 | Disposition: A | Discharge status: Home / Self Care | Payer: BC Managed Care – PPO | Source: Ambulatory Visit | Attending: Nurse Practitioner | Admitting: Nurse Practitioner

## 2020-02-11 ENCOUNTER — Encounter (HOSPITAL_COMMUNITY): Payer: Self-pay | Admitting: Nurse Practitioner

## 2020-02-11 VITALS — BP 160/100 | HR 124 | Ht 73.5 in | Wt 288.8 lb

## 2020-02-11 DIAGNOSIS — Z886 Allergy status to analgesic agent status: Secondary | ICD-10-CM | POA: Insufficient documentation

## 2020-02-11 DIAGNOSIS — G4730 Sleep apnea, unspecified: Secondary | ICD-10-CM | POA: Diagnosis not present

## 2020-02-11 DIAGNOSIS — Z79899 Other long term (current) drug therapy: Secondary | ICD-10-CM | POA: Insufficient documentation

## 2020-02-11 DIAGNOSIS — Z6837 Body mass index (BMI) 37.0-37.9, adult: Secondary | ICD-10-CM | POA: Insufficient documentation

## 2020-02-11 DIAGNOSIS — I10 Essential (primary) hypertension: Secondary | ICD-10-CM | POA: Insufficient documentation

## 2020-02-11 DIAGNOSIS — Z8041 Family history of malignant neoplasm of ovary: Secondary | ICD-10-CM | POA: Diagnosis not present

## 2020-02-11 DIAGNOSIS — D6869 Other thrombophilia: Secondary | ICD-10-CM | POA: Diagnosis not present

## 2020-02-11 DIAGNOSIS — I5189 Other ill-defined heart diseases: Secondary | ICD-10-CM | POA: Insufficient documentation

## 2020-02-11 DIAGNOSIS — Z8249 Family history of ischemic heart disease and other diseases of the circulatory system: Secondary | ICD-10-CM | POA: Insufficient documentation

## 2020-02-11 DIAGNOSIS — Z808 Family history of malignant neoplasm of other organs or systems: Secondary | ICD-10-CM | POA: Diagnosis not present

## 2020-02-11 DIAGNOSIS — Z833 Family history of diabetes mellitus: Secondary | ICD-10-CM | POA: Insufficient documentation

## 2020-02-11 DIAGNOSIS — I4891 Unspecified atrial fibrillation: Secondary | ICD-10-CM | POA: Diagnosis present

## 2020-02-11 DIAGNOSIS — E669 Obesity, unspecified: Secondary | ICD-10-CM | POA: Insufficient documentation

## 2020-02-11 DIAGNOSIS — Z7901 Long term (current) use of anticoagulants: Secondary | ICD-10-CM | POA: Insufficient documentation

## 2020-02-11 MED ORDER — POTASSIUM CHLORIDE CRYS ER 20 MEQ PO TBCR: 20.0000 meq | EXTENDED_RELEASE_TABLET | Freq: Every day | ORAL | 1 refills | Status: DC

## 2020-02-11 MED ORDER — FUROSEMIDE 40 MG PO TABS: 40.0000 mg | ORAL_TABLET | Freq: Every day | ORAL | 1 refills | Status: DC

## 2020-02-11 MED ORDER — APIXABAN 5 MG PO TABS: 5.0000 mg | ORAL_TABLET | Freq: Two times a day (BID) | ORAL | 6 refills | Status: DC

## 2020-02-11 NOTE — Telephone Encounter (Signed)
Pt seen in afib clinic today:Per Roderic Palau, NP: Assessment and Plan: 1. New onset atrial fibrillation Symptomatic with exertional shortness of breath, weight gain/orthopnea Will plan for cardioversion next week  In the interim, will  start 40 mg lasix qd with 20 meq lasix daily I will see back on Friday to see effects of diuretic, with bmet/cbc then  Continue toprol 50 mg daily covid test scheduled
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
## 2020-02-11 NOTE — Patient Instructions (Signed)
Cardioversion scheduled for Tuesday, March 9th  - Arrive at the [facility] and go to admitting at 10:30AM  -Do not eat or drink anything after midnight the night prior to your procedure.  - Take all your morning medication with a sip of water prior to arrival.  - You will not be able to drive home after your procedure.   Start Lasix 40mg  once a day Start Potassium (K-Dur) 78meq once a day

## 2020-02-12 ENCOUNTER — Other Ambulatory Visit (HOSPITAL_COMMUNITY): Payer: BC Managed Care – PPO

## 2020-02-14 ENCOUNTER — Other Ambulatory Visit (HOSPITAL_COMMUNITY)
Admission: RE | Admit: 2020-02-14 | Discharge: 2020-02-14 | Disposition: A | Discharge status: Home / Self Care | Payer: BC Managed Care – PPO | Source: Ambulatory Visit | Attending: Cardiovascular Disease | Admitting: Cardiovascular Disease

## 2020-02-14 ENCOUNTER — Other Ambulatory Visit: Payer: Self-pay

## 2020-02-14 ENCOUNTER — Encounter (HOSPITAL_COMMUNITY): Payer: Self-pay | Admitting: Nurse Practitioner

## 2020-02-14 ENCOUNTER — Ambulatory Visit (HOSPITAL_COMMUNITY)
Admission: RE | Admit: 2020-02-14 | Discharge: 2020-02-14 | Disposition: A | Discharge status: Home / Self Care | Payer: BC Managed Care – PPO | Source: Ambulatory Visit | Attending: Nurse Practitioner | Admitting: Nurse Practitioner

## 2020-02-14 VITALS — BP 148/98 | HR 117 | Ht 73.5 in | Wt 282.4 lb

## 2020-02-14 DIAGNOSIS — I10 Essential (primary) hypertension: Secondary | ICD-10-CM | POA: Insufficient documentation

## 2020-02-14 DIAGNOSIS — Z791 Long term (current) use of non-steroidal anti-inflammatories (NSAID): Secondary | ICD-10-CM | POA: Diagnosis not present

## 2020-02-14 DIAGNOSIS — G4730 Sleep apnea, unspecified: Secondary | ICD-10-CM | POA: Insufficient documentation

## 2020-02-14 DIAGNOSIS — Z79899 Other long term (current) drug therapy: Secondary | ICD-10-CM | POA: Diagnosis not present

## 2020-02-14 DIAGNOSIS — E669 Obesity, unspecified: Secondary | ICD-10-CM | POA: Diagnosis not present

## 2020-02-14 DIAGNOSIS — Z20822 Contact with and (suspected) exposure to covid-19: Secondary | ICD-10-CM | POA: Insufficient documentation

## 2020-02-14 DIAGNOSIS — I4891 Unspecified atrial fibrillation: Secondary | ICD-10-CM | POA: Insufficient documentation

## 2020-02-14 DIAGNOSIS — D6869 Other thrombophilia: Secondary | ICD-10-CM | POA: Diagnosis not present

## 2020-02-14 DIAGNOSIS — Z7901 Long term (current) use of anticoagulants: Secondary | ICD-10-CM | POA: Diagnosis not present

## 2020-02-14 LAB — BASIC METABOLIC PANEL
Anion gap: 10 (ref 5–15)
BUN: 15 mg/dL (ref 8–23)
CO2: 25 mmol/L (ref 22–32)
Calcium: 9.5 mg/dL (ref 8.9–10.3)
Chloride: 105 mmol/L (ref 98–111)
Creatinine, Ser: 1.09 mg/dL (ref 0.61–1.24)
GFR calc Af Amer: >60 mL/min (ref >60)
GFR calc non Af Amer: >60 mL/min (ref >60)
Glucose, Bld: 123 mg/dL — ABNORMAL HIGH (ref 70–99)
Potassium: 4.1 mmol/L (ref 3.5–5.1)
Sodium: 140 mmol/L (ref 135–145)

## 2020-02-14 LAB — CBC
HCT: 51 % (ref 39.0–52.0)
Hemoglobin: 16.8 g/dL (ref 13.0–17.0)
MCH: 31.2 pg (ref 26.0–34.0)
MCHC: 32.9 g/dL (ref 30.0–36.0)
MCV: 94.8 fL (ref 80.0–100.0)
Platelets: 229 10*3/uL (ref 150–400)
RBC: 5.38 MIL/uL (ref 4.22–5.81)
RDW: 12.7 % (ref 11.5–15.5)
WBC: 6.8 10*3/uL (ref 4.0–10.5)
nRBC: 0 % (ref 0.0–0.2)

## 2020-02-14 LAB — SARS CORONAVIRUS 2 (TAT 6-24 HRS): SARS Coronavirus 2: NEGATIVE

## 2020-02-14 MED ORDER — METOPROLOL SUCCINATE ER 50 MG PO TB24: ORAL_TABLET | ORAL | 2 refills | Status: DC

## 2020-02-14 NOTE — Addendum Note (Signed)
Encounter addended by: Sherran Needs, NP on: 02/14/2020 9:35 AM  Actions taken: Clinical Note Signed

## 2020-02-14 NOTE — Patient Instructions (Signed)
Increase metoprolol to 1 tablet in the AM and 1/2 tablet in the PM
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
## 2020-02-17 ENCOUNTER — Ambulatory Visit: Payer: BC Managed Care – PPO | Admitting: Physician Assistant

## 2020-02-18 ENCOUNTER — Ambulatory Visit (HOSPITAL_COMMUNITY): Payer: BC Managed Care – PPO | Admitting: Certified Registered Nurse Anesthetist

## 2020-02-18 ENCOUNTER — Other Ambulatory Visit: Payer: Self-pay

## 2020-02-18 ENCOUNTER — Encounter (HOSPITAL_COMMUNITY): Payer: Self-pay | Admitting: Cardiovascular Disease

## 2020-02-18 ENCOUNTER — Ambulatory Visit (HOSPITAL_COMMUNITY)
Admission: RE | Admit: 2020-02-18 | Discharge: 2020-02-18 | Disposition: A | Discharge status: Home / Self Care | Payer: BC Managed Care – PPO | Attending: Cardiovascular Disease | Admitting: Cardiovascular Disease

## 2020-02-18 ENCOUNTER — Encounter (HOSPITAL_COMMUNITY)
Admission: RE | Disposition: A | Discharge status: Home / Self Care | Payer: BC Managed Care – PPO | Source: Home / Self Care | Attending: Cardiovascular Disease

## 2020-02-18 DIAGNOSIS — I4819 Other persistent atrial fibrillation: Secondary | ICD-10-CM | POA: Diagnosis not present

## 2020-02-18 DIAGNOSIS — I4892 Unspecified atrial flutter: Secondary | ICD-10-CM | POA: Insufficient documentation

## 2020-02-18 DIAGNOSIS — I10 Essential (primary) hypertension: Secondary | ICD-10-CM | POA: Insufficient documentation

## 2020-02-18 DIAGNOSIS — Z79899 Other long term (current) drug therapy: Secondary | ICD-10-CM | POA: Diagnosis not present

## 2020-02-18 DIAGNOSIS — Z8249 Family history of ischemic heart disease and other diseases of the circulatory system: Secondary | ICD-10-CM | POA: Diagnosis not present

## 2020-02-18 DIAGNOSIS — Z888 Allergy status to other drugs, medicaments and biological substances status: Secondary | ICD-10-CM | POA: Insufficient documentation

## 2020-02-18 DIAGNOSIS — Z7901 Long term (current) use of anticoagulants: Secondary | ICD-10-CM | POA: Diagnosis not present

## 2020-02-18 DIAGNOSIS — I4891 Unspecified atrial fibrillation: Secondary | ICD-10-CM | POA: Insufficient documentation

## 2020-02-18 HISTORY — PX: CARDIOVERSION: SHX1299

## 2020-02-18 SURGERY — CARDIOVERSION
Anesthesia: General

## 2020-02-18 MED ORDER — PROPOFOL 10 MG/ML IV BOLUS
INTRAVENOUS | Status: DC | PRN
  Administered 2020-02-18: 20 mg via INTRAVENOUS
  Administered 2020-02-18: 80 mg via INTRAVENOUS

## 2020-02-18 MED ORDER — SODIUM CHLORIDE 0.9 % IV SOLN: INTRAVENOUS | Status: DC

## 2020-02-18 MED ORDER — LIDOCAINE 2% (20 MG/ML) 5 ML SYRINGE
INTRAMUSCULAR | Status: DC | PRN
  Administered 2020-02-18: 40 mg via INTRAVENOUS

## 2020-02-18 NOTE — Discharge Instructions (Signed)
Electrical Cardioversion Electrical cardioversion is the delivery of a jolt of electricity to restore a normal rhythm to the heart. A rhythm that is too fast or is not regular keeps the heart from pumping well. In this procedure, sticky patches or metal paddles are placed on the chest to deliver electricity to the heart from a device. This procedure may be done in an emergency if:  There is low or no blood pressure as a result of the heart rhythm.  Normal rhythm must be restored as fast as possible to protect the brain and heart from further damage.  It may save a life. This may also be a scheduled procedure for irregular or fast heart rhythms that are not immediately life-threatening. Tell a health care provider about:  Any allergies you have.  All medicines you are taking, including vitamins, herbs, eye drops, creams, and over-the-counter medicines.  Any problems you or family members have had with anesthetic medicines.  Any blood disorders you have.  Any surgeries you have had.  Any medical conditions you have.  Whether you are pregnant or may be pregnant. What are the risks? Generally, this is a safe procedure. However, problems may occur, including:  Allergic reactions to medicines.  A blood clot that breaks free and travels to other parts of your body.  The possible return of an abnormal heart rhythm within hours or days after the procedure.  Your heart stopping (cardiac arrest). This is rare. What happens before the procedure? Medicines  Your health care provider may have you start taking: ? Blood-thinning medicines (anticoagulants) so your blood does not clot as easily. ? Medicines to help stabilize your heart rate and rhythm.  Ask your health care provider about: ? Changing or stopping your regular medicines. This is especially important if you are taking diabetes medicines or blood thinners. ? Taking medicines such as aspirin and ibuprofen. These medicines can  thin your blood. Do not take these medicines unless your health care provider tells you to take them. ? Taking over-the-counter medicines, vitamins, herbs, and supplements. General instructions  Follow instructions from your health care provider about eating or drinking restrictions.  Plan to have someone take you home from the hospital or clinic.  If you will be going home right after the procedure, plan to have someone with you for 24 hours.  Ask your health care provider what steps will be taken to help prevent infection. These may include washing your skin with a germ-killing soap. What happens during the procedure?   An IV will be inserted into one of your veins.  Sticky patches (electrodes) or metal paddles may be placed on your chest.  You will be given a medicine to help you relax (sedative).  An electrical shock will be delivered. The procedure may vary among health care providers and hospitals. What can I expect after the procedure?  Your blood pressure, heart rate, breathing rate, and blood oxygen level will be monitored until you leave the hospital or clinic.  Your heart rhythm will be watched to make sure it does not change.  You may have some redness on the skin where the shocks were given. Follow these instructions at home:  Do not drive for 24 hours if you were given a sedative during your procedure.  Take over-the-counter and prescription medicines only as told by your health care provider.  Ask your health care provider how to check your pulse. Check it often.  Rest for 48 hours after the procedure or   as told by your health care provider.  Avoid or limit your caffeine use as told by your health care provider.  Keep all follow-up visits as told by your health care provider. This is important. Contact a health care provider if:  You feel like your heart is beating too quickly or your pulse is not regular.  You have a serious muscle cramp that does not go  away. Get help right away if:  You have discomfort in your chest.  You are dizzy or you feel faint.  You have trouble breathing or you are short of breath.  Your speech is slurred.  You have trouble moving an arm or leg on one side of your body.  Your fingers or toes turn cold or blue. Summary  Electrical cardioversion is the delivery of a jolt of electricity to restore a normal rhythm to the heart.  This procedure may be done right away in an emergency or may be a scheduled procedure if the condition is not an emergency.  Generally, this is a safe procedure.  After the procedure, check your pulse often as told by your health care provider. This information is not intended to replace advice given to you by your health care provider. Make sure you discuss any questions you have with your health care provider. Document Revised: 07/01/2019 Document Reviewed: 07/01/2019 Elsevier Patient Education  2020 Elsevier Inc.  

## 2020-02-18 NOTE — Anesthesia Postprocedure Evaluation (Signed)
Anesthesia Post Note  Patient: Douglas Chandler  Procedure(s) Performed: CARDIOVERSION (N/A )     Patient location during evaluation: Endoscopy Anesthesia Type: General Level of consciousness: awake and alert Pain management: pain level controlled Vital Signs Assessment: post-procedure vital signs reviewed and stable Respiratory status: spontaneous breathing, nonlabored ventilation, respiratory function stable and patient connected to nasal cannula oxygen Cardiovascular status: blood pressure returned to baseline and stable Postop Assessment: no apparent nausea or vomiting Anesthetic complications: no    Last Vitals:  Vitals:   02/18/20 1143 02/18/20 1153  BP: 114/85 110/78  Pulse: 74 73  Resp: 14 16  Temp: 36.8 C   SpO2: 94% 95%    Last Pain:  Vitals:   02/18/20 1153  TempSrc:   PainSc: 0-No pain                 Mckaylee Dimalanta COKER

## 2020-02-18 NOTE — Anesthesia Preprocedure Evaluation (Addendum)
Anesthesia Evaluation  Patient identified by MRN, date of birth, ID band Patient awake    Reviewed: Allergy & Precautions, NPO status , Patient's Chart, lab work & pertinent test results  Airway Mallampati: III  TM Distance: >3 FB Neck ROM: Full    Dental  (+) Teeth Intact   Pulmonary    breath sounds clear to auscultation       Cardiovascular hypertension,  Rhythm:Irregular Rate:Tachycardia     Neuro/Psych    GI/Hepatic   Endo/Other    Renal/GU      Musculoskeletal   Abdominal (+) + obese,   Peds  Hematology   Anesthesia Other Findings   Reproductive/Obstetrics                             Anesthesia Physical Anesthesia Plan  ASA: III  Anesthesia Plan: General   Post-op Pain Management:    Induction: Intravenous  PONV Risk Score and Plan:   Airway Management Planned: Mask  Additional Equipment:   Intra-op Plan:   Post-operative Plan:   Informed Consent: I have reviewed the patients History and Physical, chart, labs and discussed the procedure including the risks, benefits and alternatives for the proposed anesthesia with the patient or authorized representative who has indicated his/her understanding and acceptance.       Plan Discussed with:   Anesthesia Plan Comments:         Anesthesia Quick Evaluation

## 2020-02-18 NOTE — Interval H&P Note (Signed)
History and Physical Interval Note:  02/18/2020 11:11 AM  Douglas Chandler  has presented today for surgery, with the diagnosis of AFIB.  The various methods of treatment have been discussed with the patient and family. After consideration of risks, benefits and other options for treatment, the patient has consented to  Procedure(s): CARDIOVERSION (N/A) as a surgical intervention.  The patient's history has been reviewed, patient examined, no change in status, stable for surgery.  I have reviewed the patient's chart and labs.  Questions were answered to the patient's satisfaction.     Skeet Latch, MD

## 2020-02-18 NOTE — Transfer of Care (Signed)
Immediate Anesthesia Transfer of Care Note  Patient: [name]  Procedure(s) Performed: CARDIOVERSION (N/A )  Patient Location: Endoscopy Unit  Anesthesia Type:General  Level of Consciousness: drowsy, patient cooperative and responds to stimulation  Airway & Oxygen Therapy: Patient Spontanous Breathing  Post-op Assessment: Report given to RN and Post -op Vital signs reviewed and stable  Post vital signs: Reviewed and stable  Last Vitals:  Vitals Value Taken Time  BP    Temp    Pulse    Resp    SpO2      Last Pain:  Vitals:   02/18/20 1044  TempSrc: Oral  PainSc: 0-No pain         Complications: No apparent anesthesia complications

## 2020-02-18 NOTE — CV Procedure (Signed)
Electrical Cardioversion Procedure Note RESEAN KAPPLE [id number] 28-Feb-1956  Procedure: Electrical Cardioversion Indications:  Atrial Fibrillation  Procedure Details Consent: Risks of procedure as well as the alternatives and risks of each were explained to the (patient/caregiver).  Consent for procedure obtained. Time Out: Verified patient identification, verified procedure, site/side was marked, verified correct patient position, special equipment/implants available, medications/allergies/relevent history reviewed, required imaging and test results available.  Performed  Patient placed on cardiac monitor, pulse oximetry, supplemental oxygen as necessary.  Sedation given: propfol Pacer pads placed anterior and posterior chest.  Cardioverted 2 time(s).  Cardioverted at 200J unsuccessful.  200J with pressure applied to chest successful.  Evaluation Findings: Post procedure EKG shows: NSR Complications: None Patient did tolerate procedure well.   Skeet Latch, MD 02/18/2020, 11:36 AM

## 2020-02-18 NOTE — Anesthesia Procedure Notes (Signed)
Procedure Name: General with mask airway Date/Time: 02/18/2020 11:25 AM Performed by: Janace Litten, CRNA Pre-anesthesia Checklist: Patient identified, Emergency Drugs available, Suction available and Patient being monitored Patient Re-evaluated:Patient Re-evaluated prior to induction Oxygen Delivery Method: Ambu bag Preoxygenation: Pre-oxygenation with 100% oxygen

## 2020-02-19 ENCOUNTER — Encounter: Payer: Self-pay | Admitting: *Deleted

## 2020-02-25 ENCOUNTER — Other Ambulatory Visit: Payer: Self-pay

## 2020-02-25 ENCOUNTER — Encounter (HOSPITAL_COMMUNITY): Payer: Self-pay | Admitting: Nurse Practitioner

## 2020-02-25 ENCOUNTER — Ambulatory Visit (HOSPITAL_COMMUNITY)
Admission: RE | Admit: 2020-02-25 | Discharge: 2020-02-25 | Disposition: A | Discharge status: Home / Self Care | Payer: BC Managed Care – PPO | Source: Ambulatory Visit | Attending: Nurse Practitioner | Admitting: Nurse Practitioner

## 2020-02-25 VITALS — BP 128/82 | HR 67 | Ht 74.0 in | Wt 271.8 lb

## 2020-02-25 DIAGNOSIS — D6869 Other thrombophilia: Secondary | ICD-10-CM | POA: Diagnosis not present

## 2020-02-25 DIAGNOSIS — Z7901 Long term (current) use of anticoagulants: Secondary | ICD-10-CM | POA: Diagnosis not present

## 2020-02-25 DIAGNOSIS — I4891 Unspecified atrial fibrillation: Secondary | ICD-10-CM | POA: Insufficient documentation

## 2020-02-25 DIAGNOSIS — I483 Typical atrial flutter: Secondary | ICD-10-CM | POA: Diagnosis not present

## 2020-02-25 DIAGNOSIS — I10 Essential (primary) hypertension: Secondary | ICD-10-CM | POA: Diagnosis not present

## 2020-02-25 DIAGNOSIS — Z79899 Other long term (current) drug therapy: Secondary | ICD-10-CM | POA: Insufficient documentation

## 2020-02-25 DIAGNOSIS — Z8249 Family history of ischemic heart disease and other diseases of the circulatory system: Secondary | ICD-10-CM | POA: Diagnosis not present

## 2020-02-25 DIAGNOSIS — G4730 Sleep apnea, unspecified: Secondary | ICD-10-CM | POA: Diagnosis not present

## 2020-02-25 MED ORDER — FUROSEMIDE 40 MG PO TABS: 40.0000 mg | ORAL_TABLET | Freq: Every day | ORAL | 1 refills | Status: DC | PRN
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
## 2020-03-13 ENCOUNTER — Encounter: Payer: Self-pay | Admitting: Student

## 2020-03-13 ENCOUNTER — Other Ambulatory Visit: Payer: Self-pay

## 2020-03-13 ENCOUNTER — Telehealth: Payer: Self-pay | Admitting: Student

## 2020-03-13 ENCOUNTER — Ambulatory Visit (INDEPENDENT_AMBULATORY_CARE_PROVIDER_SITE_OTHER): Payer: BC Managed Care – PPO | Admitting: Student

## 2020-03-13 VITALS — BP 122/86 | HR 63 | Ht 74.0 in | Wt 272.8 lb

## 2020-03-13 DIAGNOSIS — I5022 Chronic systolic (congestive) heart failure: Secondary | ICD-10-CM

## 2020-03-13 DIAGNOSIS — R002 Palpitations: Secondary | ICD-10-CM

## 2020-03-13 DIAGNOSIS — I4891 Unspecified atrial fibrillation: Secondary | ICD-10-CM

## 2020-03-13 DIAGNOSIS — Z79899 Other long term (current) drug therapy: Secondary | ICD-10-CM | POA: Diagnosis not present

## 2020-03-13 DIAGNOSIS — I10 Essential (primary) hypertension: Secondary | ICD-10-CM | POA: Diagnosis not present

## 2020-03-13 MED ORDER — POTASSIUM CHLORIDE CRYS ER 20 MEQ PO TBCR: EXTENDED_RELEASE_TABLET | ORAL | 3 refills | Status: DC

## 2020-03-13 MED ORDER — ENTRESTO 24-26 MG PO TABS: 1.0000 | ORAL_TABLET | Freq: Two times a day (BID) | ORAL | 3 refills | Status: DC

## 2020-03-13 MED ORDER — FUROSEMIDE 20 MG PO TABS: 20.0000 mg | ORAL_TABLET | ORAL | 3 refills | Status: DC | PRN

## 2020-03-13 NOTE — Telephone Encounter (Signed)
Pt c/o medication issue:  1. Name of Medication: sacubitril-valsartan (ENTRESTO) 24-26 MG  2. How are you currently taking this medication (dosage and times per day)? Pt has not started taking this yet  3. Are you having a reaction (difficulty breathing--STAT)? No  4. What is your medication issue? Pt went to pick up the medicine from his pharmacy and it was going to be ~$500. He can not afford the medication. He would like to know if there is an alternative he can take in it's place

## 2020-03-13 NOTE — Telephone Encounter (Signed)
I spoke to the patient and will discuss Entresto on Monday.  He verbalized understanding.

## 2020-03-13 NOTE — Patient Instructions (Addendum)
Medication Instructions:  START ENTRESTO 24/26mg  TWICE DAILY TAKE LASIX 20 mg ONLY WHEN NEEDED TAKE POTASSIUM 15meq ONLY WHEN TAKING LASIX *If you need a refill on your cardiac medications before your next appointment, please call your pharmacy*   Lab Work: please schedule 10 days BMET If you have labs (blood work) drawn today and your tests are completely normal, you will receive your results only by: Marland Kitchen MyChart Message (if you have MyChart) OR . A paper copy in the mail If you have any lab test that is abnormal or we need to change your treatment, we will call you to review the results.   Testing/Procedures: none   Follow-Up: At [facility], you and your health needs are our priority.  As part of our continuing mission to provide you with exceptional heart care, we have created designated Provider Care Teams.  These Care Teams include your primary Cardiologist (physician) and Advanced Practice Providers (APPs -  Physician Assistants and Nurse Practitioners) who all work together to provide you with the care you need, when you need it.   Your next appointment:   4 WEEKS  The format for your next appointment:   In Person  Provider:   Oda Kilts, PA   Other Instructions  Sacubitril; Valsartan Oral Tablets What is this medicine? SACUBITRIL; VALSARTAN (sak UE bi tril; val SAR tan) is a combination of a neprilysin inhibitor and a an angiotensin II receptor blocker. It treats heart failure. This medicine may be used for other purposes; ask your health care provider or pharmacist if you have questions. COMMON BRAND NAME(S): Entresto What should I tell my health care provider before I take this medicine? They need to know if you have any of these conditions:  diabetes and take a medicine that contains aliskiren  kidney disease  liver disease  an unusual or allergic reaction to sacubitril; valsartan, drugs called angiotensin converting enzyme (ACE) inhibitors, angiotensin  II receptor blockers (ARBs), other medicines, foods, dyes, or preservatives  pregnant or trying to get pregnant  breast-feeding How should I use this medicine? Take this drug by mouth. Take it as directed on the prescription label at the same time every day. You can take it with or without food. If it upsets your stomach, take it with food. Keep taking it unless your health care provider tells you to stop. Talk to your health care provider about the use of this drug in children. While it may be prescribed for children as young as 1 for selected conditions, precautions do apply. Overdosage: If you think you have taken too much of this medicine contact a poison control center or emergency room at once. NOTE: This medicine is only for you. Do not share this medicine with others. What if I miss a dose? If you miss a dose, take it as soon as you can. If it is almost time for your next dose, take only that dose. Do not take double or extra doses. What may interact with this medicine? Do not take this medicine with any of the following medicines:  aliskiren if you have diabetes  angiotensin-converting enzyme (ACE) inhibitors, like benazepril, captopril, enalapril, fosinopril, lisinopril, or ramipril This medicine may also interact with the following medicines:  angiotensin II receptor blockers (ARBs) like azilsartan, candesartan, eprosartan, irbesartan, losartan, olmesartan, telmisartan, or valsartan  lithium  NSAIDS, medicines for pain and inflammation, like ibuprofen or naproxen  potassium-sparing diuretics like amiloride, spironolactone, and triamterene  potassium supplements This list may not describe all  possible interactions. Give your health care provider a list of all the medicines, herbs, non-prescription drugs, or dietary supplements you use. Also tell them if you smoke, drink alcohol, or use illegal drugs. Some items may interact with your medicine. What should I watch for while  using this medicine? Tell your doctor or healthcare professional if your symptoms do not start to get better or if they get worse. Do not become pregnant while taking this medicine. Women should inform their doctor if they wish to become pregnant or think they might be pregnant. There is a potential for serious side effects to an unborn child. Talk to your health care professional or pharmacist for more information. You may get dizzy. Do not drive, use machinery, or do anything that needs mental alertness until you know how this medicine affects you. Do not stand or sit up quickly, especially if you are an older patient. This reduces the risk of dizzy or fainting spells. Avoid alcoholic drinks; they can make you more dizzy. What side effects may I notice from receiving this medicine? Side effects that you should report to your doctor or health care professional as soon as possible:  allergic reactions like skin rash, itching or hives, swelling of the face, lips, or tongue  signs and symptoms of increased potassium like muscle weakness; chest pain; or fast, irregular heartbeat  signs and symptoms of kidney injury like trouble passing urine or change in the amount of urine  signs and symptoms of low blood pressure like feeling dizzy or lightheaded, or if you develop extreme fatigue Side effects that usually do not require medical attention (report to your doctor or health care professional if they continue or are bothersome):  cough This list may not describe all possible side effects. Call your doctor for medical advice about side effects. You may report side effects to FDA at 1-800-FDA-1088. Where should I keep my medicine? Keep out of the reach of children and pets. Store at room temperature between 20 and 25 degrees C (68 and 77 degrees F). Protect from moisture. Keep the container tightly closed. Throw away any unused drug after the expiration date. NOTE: This sheet is a summary. It may not  cover all possible information. If you have questions about this medicine, talk to your doctor, pharmacist, or health care provider.  2020 Elsevier/Gold Standard (2019-07-03 16:03:07)

## 2020-03-13 NOTE — Telephone Encounter (Signed)
I spoke to the patient and reviewed medication advisement.  He verbalized understanding.

## 2020-03-13 NOTE — Telephone Encounter (Signed)
New message  Patient is calling in with after-visit questions about medication and which medications he should be taking. Please give patient a call back to assist.
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
## 2020-03-16 ENCOUNTER — Telehealth: Payer: Self-pay

## 2020-03-16 NOTE — Telephone Encounter (Signed)
I spoke to the patient and informed him that he does qualify for the [facility] cards.  He will pick up today.

## 2020-03-17 ENCOUNTER — Ambulatory Visit: Payer: BC Managed Care – PPO | Admitting: Physician Assistant

## 2020-03-23 ENCOUNTER — Other Ambulatory Visit: Payer: Self-pay

## 2020-03-23 ENCOUNTER — Other Ambulatory Visit: Payer: BC Managed Care – PPO | Admitting: *Deleted

## 2020-03-23 DIAGNOSIS — I10 Essential (primary) hypertension: Secondary | ICD-10-CM

## 2020-03-23 DIAGNOSIS — R002 Palpitations: Secondary | ICD-10-CM

## 2020-03-23 DIAGNOSIS — Z79899 Other long term (current) drug therapy: Secondary | ICD-10-CM

## 2020-03-23 LAB — BASIC METABOLIC PANEL
BUN/Creatinine Ratio: 16 (ref 10–24)
BUN: 15 mg/dL (ref 8–27)
CO2: 23 mmol/L (ref 20–29)
Calcium: 10.1 mg/dL (ref 8.6–10.2)
Chloride: 104 mmol/L (ref 96–106)
Creatinine, Ser: 0.92 mg/dL (ref 0.76–1.27)
GFR calc Af Amer: 103 mL/min/{1.73_m2} (ref >59)
GFR calc non Af Amer: 89 mL/min/{1.73_m2} (ref >59)
Glucose: 72 mg/dL (ref 65–99)
Potassium: 4.3 mmol/L (ref 3.5–5.2)
Sodium: 142 mmol/L (ref 134–144)

## 2020-03-30 ENCOUNTER — Other Ambulatory Visit (HOSPITAL_COMMUNITY): Payer: Self-pay | Admitting: *Deleted

## 2020-03-30 ENCOUNTER — Telehealth: Payer: Self-pay | Admitting: Internal Medicine

## 2020-03-30 MED ORDER — METOPROLOL SUCCINATE ER 50 MG PO TB24: ORAL_TABLET | ORAL | 2 refills | Status: DC

## 2020-03-30 NOTE — Telephone Encounter (Signed)
Patient called for a refill of his medication. I informed him the medication was prescribed by a different office and told him to call them.
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
## 2020-04-10 ENCOUNTER — Other Ambulatory Visit: Payer: Self-pay

## 2020-04-10 ENCOUNTER — Encounter: Payer: Self-pay | Admitting: Student

## 2020-04-10 ENCOUNTER — Ambulatory Visit (INDEPENDENT_AMBULATORY_CARE_PROVIDER_SITE_OTHER): Payer: BC Managed Care – PPO | Admitting: Student

## 2020-04-10 VITALS — BP 118/68 | HR 74 | Ht 74.0 in | Wt 269.0 lb

## 2020-04-10 DIAGNOSIS — I5022 Chronic systolic (congestive) heart failure: Secondary | ICD-10-CM | POA: Diagnosis not present

## 2020-04-10 DIAGNOSIS — I10 Essential (primary) hypertension: Secondary | ICD-10-CM | POA: Diagnosis not present

## 2020-04-10 DIAGNOSIS — R002 Palpitations: Secondary | ICD-10-CM | POA: Diagnosis not present

## 2020-04-10 DIAGNOSIS — I493 Ventricular premature depolarization: Secondary | ICD-10-CM

## 2020-04-10 DIAGNOSIS — I4891 Unspecified atrial fibrillation: Secondary | ICD-10-CM

## 2020-04-10 MED ORDER — SPIRONOLACTONE 25 MG PO TABS: 25.0000 mg | ORAL_TABLET | Freq: Every day | ORAL | 3 refills | Status: DC

## 2020-04-10 NOTE — Patient Instructions (Addendum)
Medication Instructions:  START SPIRONOLACTONE 25 MG daily *If you need a refill on your cardiac medications before your next appointment, please call your pharmacy*   Lab Work: IN 10 DAYS ([place]) BMET If you have labs (blood work) drawn today and your tests are completely normal, you will receive your results only by: Marland Kitchen MyChart Message (if you have MyChart) OR . A paper copy in the mail If you have any lab test that is abnormal or we need to change your treatment, we will call you to review the results.   Testing/Procedures: EARLY June ([place]) Your physician has requested that you have an echocardiogram. Echocardiography is a painless test that uses sound waves to create images of your heart. It provides your doctor with information about the size and shape of your heart and how well your heart's chambers and valves are working. This procedure takes approximately one hour. There are no restrictions for this procedure.     Follow-Up: At [facility], you and your health needs are our priority.  As part of our continuing mission to provide you with exceptional heart care, we have created designated Provider Care Teams.  These Care Teams include your primary Cardiologist (physician) and Advanced Practice Providers (APPs -  Physician Assistants and Nurse Practitioners) who all work together to provide you with the care you need, when you need it.   Your next appointment:   3 MONTHS  The format for your next appointment:   IN PERSON  Provider:   Oda Kilts, PA   Other Instructions Spironolactone Oral Tablets What is this medicine? SPIRONOLACTONE (speer on [place] LAK tone) is a diuretic. It helps you make more urine and to lose excess water from your body. This medicine is used to treat high blood pressure, and edema or swelling from heart, kidney, or liver disease. It is also used to treat patients who make too much aldosterone or have low potassium. This medicine  may be used for other purposes; ask your health care provider or pharmacist if you have questions. COMMON BRAND NAME(S): Aldactone What should I tell my health care provider before I take this medicine? They need to know if you have any of these conditions:  high blood level of potassium  kidney disease or trouble making urine  liver disease  an unusual or allergic reaction to spironolactone, other medicines, foods, dyes, or preservatives  pregnant or trying to get pregnant  breast-feeding How should I use this medicine? Take this drug by mouth. Take it as directed on the prescription label at the same time every day. You can take it with or without food. You should always take it the same way. Keep taking it unless your health care provider tells you to stop. Talk to your health care provider about the use of this drug in children. Special care may be needed. Overdosage: If you think you have taken too much of this medicine contact a poison control center or emergency room at once. NOTE: This medicine is only for you. Do not share this medicine with others. What if I miss a dose? If you miss a dose, take it as soon as you can. If it is almost time for your next dose, take only that dose. Do not take double or extra doses. What may interact with this medicine? Do not take this medicine with any of the following medications:  cidofovir  eplerenone  tranylcypromine This medicine may also interact with the following medications:  aspirin  certain medicines for blood pressure or heart disease like benazepril, lisinopril, losartan, valsartan  certain medicines that treat or prevent blood clots like heparin and enoxaparin  cholestyramine  cyclosporine  digoxin  lithium  medicines that relax muscles for surgery  NSAIDs, medicines for pain and inflammation, like ibuprofen or naproxen  other diuretics  potassium supplements  steroid medicines like prednisone or  cortisone  trimethoprim This list may not describe all possible interactions. Give your health care provider a list of all the medicines, herbs, non-prescription drugs, or dietary supplements you use. Also tell them if you smoke, drink alcohol, or use illegal drugs. Some items may interact with your medicine. What should I watch for while using this medicine? Visit your doctor or health care professional for regular checks on your progress. Check your blood pressure as directed. Ask your doctor what your blood pressure should be, and when you should contact them. You may need to be on a special diet while taking this medicine. Ask your doctor. Also, ask how many glasses of fluid you need to drink a day. You must not get dehydrated. This medicine may make you feel confused, dizzy or lightheaded. Drinking alcohol and taking some medicines can make this worse. Do not drive, use machinery, or do anything that needs mental alertness until you know how this medicine affects you. Do not sit or stand up quickly. What side effects may I notice from receiving this medicine? Side effects that you should report to your doctor or health care professional as soon as possible:  allergic reactions such as skin rash or itching, hives, swelling of the lips, mouth, tongue, or throat  black or tarry stools  fast, irregular heartbeat  fever  muscle pain, cramps  numbness, tingling in hands or feet  trouble breathing  trouble passing urine  unusual bleeding  unusually weak or tired Side effects that usually do not require medical attention (report to your doctor or health care professional if they continue or are bothersome):  change in voice or hair growth  confusion  dizzy, drowsy  dry mouth, increased thirst  enlarged or tender breasts  headache  irregular menstrual periods  sexual difficulty, unable to have an erection  stomach upset This list may not describe all possible side  effects. Call your doctor for medical advice about side effects. You may report side effects to FDA at 1-800-FDA-1088. Where should I keep my medicine? Keep out of the reach of children and pets. Store at room temperature between 20 and 25 degrees C (68 and 77 degrees F). Throw away any unused drug after the expiration date. NOTE: This sheet is a summary. It may not cover all possible information. If you have questions about this medicine, talk to your doctor, pharmacist, or health care provider.  2020 Elsevier/Gold Standard (2019-07-23 12:38:34)

## 2020-04-12 ENCOUNTER — Emergency Department (HOSPITAL_COMMUNITY): Payer: BC Managed Care – PPO

## 2020-04-12 ENCOUNTER — Emergency Department (HOSPITAL_COMMUNITY)
Admission: EM | Admit: 2020-04-12 | Discharge: 2020-04-12 | Disposition: A | Discharge status: Home / Self Care | Payer: BC Managed Care – PPO | Attending: Emergency Medicine | Admitting: Emergency Medicine

## 2020-04-12 ENCOUNTER — Other Ambulatory Visit: Payer: Self-pay

## 2020-04-12 ENCOUNTER — Encounter (HOSPITAL_COMMUNITY): Payer: Self-pay | Admitting: Emergency Medicine

## 2020-04-12 DIAGNOSIS — I10 Essential (primary) hypertension: Secondary | ICD-10-CM | POA: Insufficient documentation

## 2020-04-12 DIAGNOSIS — I5022 Chronic systolic (congestive) heart failure: Secondary | ICD-10-CM | POA: Diagnosis not present

## 2020-04-12 DIAGNOSIS — R002 Palpitations: Secondary | ICD-10-CM | POA: Diagnosis present

## 2020-04-12 DIAGNOSIS — I110 Hypertensive heart disease with heart failure: Secondary | ICD-10-CM | POA: Diagnosis not present

## 2020-04-12 DIAGNOSIS — Z7901 Long term (current) use of anticoagulants: Secondary | ICD-10-CM | POA: Insufficient documentation

## 2020-04-12 DIAGNOSIS — Z79899 Other long term (current) drug therapy: Secondary | ICD-10-CM | POA: Diagnosis not present

## 2020-04-12 DIAGNOSIS — I4891 Unspecified atrial fibrillation: Secondary | ICD-10-CM | POA: Insufficient documentation

## 2020-04-12 LAB — CBC WITH DIFFERENTIAL/PLATELET
Abs Immature Granulocytes: 0.03 10*3/uL (ref 0.00–0.07)
Basophils Absolute: 0.1 10*3/uL (ref 0.0–0.1)
Basophils Relative: 0 %
Eosinophils Absolute: 0 10*3/uL (ref 0.0–0.5)
Eosinophils Relative: 0 %
HCT: 53.9 % — ABNORMAL HIGH (ref 39.0–52.0)
Hemoglobin: 18.1 g/dL — ABNORMAL HIGH (ref 13.0–17.0)
Immature Granulocytes: 0 %
Lymphocytes Relative: 12 %
Lymphs Abs: 1.8 10*3/uL (ref 0.7–4.0)
MCH: 30.8 pg (ref 26.0–34.0)
MCHC: 33.6 g/dL (ref 30.0–36.0)
MCV: 91.8 fL (ref 80.0–100.0)
Monocytes Absolute: 0.8 10*3/uL (ref 0.1–1.0)
Monocytes Relative: 6 %
Neutro Abs: 11.9 10*3/uL — ABNORMAL HIGH (ref 1.7–7.7)
Neutrophils Relative %: 82 %
Platelets: 176 10*3/uL (ref 150–400)
RBC: 5.87 MIL/uL — ABNORMAL HIGH (ref 4.22–5.81)
RDW: 13.8 % (ref 11.5–15.5)
WBC: 14.6 10*3/uL — ABNORMAL HIGH (ref 4.0–10.5)
nRBC: 0 % (ref 0.0–0.2)

## 2020-04-12 LAB — I-STAT CHEM 8, ED
BUN: 26 mg/dL — ABNORMAL HIGH (ref 8–23)
Calcium, Ion: 1.11 mmol/L — ABNORMAL LOW (ref 1.15–1.40)
Chloride: 108 mmol/L (ref 98–111)
Creatinine, Ser: 1.1 mg/dL (ref 0.61–1.24)
Glucose, Bld: 124 mg/dL — ABNORMAL HIGH (ref 70–99)
HCT: 53 % — ABNORMAL HIGH (ref 39.0–52.0)
Hemoglobin: 18 g/dL — ABNORMAL HIGH (ref 13.0–17.0)
Potassium: 4 mmol/L (ref 3.5–5.1)
Sodium: 140 mmol/L (ref 135–145)
TCO2: 21 mmol/L — ABNORMAL LOW (ref 22–32)

## 2020-04-12 LAB — TROPONIN I (HIGH SENSITIVITY): Troponin I (High Sensitivity): 4 ng/L (ref <18)

## 2020-04-12 MED ORDER — METOPROLOL SUCCINATE ER 25 MG PO TB24
25.0000 mg | ORAL_TABLET | Freq: Once | ORAL | Status: AC
  Administered 2020-04-12: 22:00:00 25 mg via ORAL
  Filled 2020-04-12: qty 1

## 2020-04-12 MED ORDER — DILTIAZEM HCL 25 MG/5ML IV SOLN
20.0000 mg | Freq: Once | INTRAVENOUS | Status: AC
  Administered 2020-04-12: 20 mg via INTRAVENOUS
  Filled 2020-04-12: qty 5

## 2020-04-12 MED ORDER — METOPROLOL SUCCINATE ER 50 MG PO TB24: ORAL_TABLET | ORAL | 0 refills | Status: DC

## 2020-04-12 NOTE — Discharge Instructions (Addendum)
You have been diagnosed today with Atrial Fibrillation with RVR.  At this time there does not appear to be the presence of an emergent medical condition, however there is always the potential for conditions to change. Please read and follow the below instructions.  Please return to the Emergency Department immediately for any new or worsening symptoms or if your symptoms return. Please be sure to follow up with your Primary Care Provider within one week regarding your visit today; please call their office to schedule an appointment even if you are feeling better for a follow-up visit. Please increase your nighttime dose of metoprolol to 50 mg.  Call your cardiologist office tomorrow morning to schedule a follow-up visit.  Return immediately to the ER for any return of symptoms or additional concerns.  Get help right away if: You have pain in your chest or your belly (abdomen). You have trouble breathing. You have side effects of blood thinners, such as blood in your vomit, poop (stool), or pee (urine), or bleeding that cannot stop. You have any signs of a stroke. "BE FAST" is an easy way to remember the main warning signs: B - Balance. Signs are dizziness, sudden trouble walking, or loss of balance. E - Eyes. Signs are trouble seeing or a change in how you see. F - Face. Signs are sudden weakness or loss of feeling in the face, or the face or eyelid drooping on one side. A - Arms. Signs are weakness or loss of feeling in an arm. This happens suddenly and usually on one side of the body. S - Speech. Signs are sudden trouble speaking, slurred speech, or trouble understanding what people say. T - Time. Time to call emergency services. Write down what time symptoms started. You have other signs of a stroke, such as: A sudden, very bad headache with no known cause. Feeling like you may vomit (nausea). Vomiting. A seizure.  Please read the additional information packets attached to your discharge  summary.  Do not take your medicine if  develop an itchy rash, swelling in your mouth or lips, or difficulty breathing; call 911 and seek immediate emergency medical attention if this occurs.  Note: Portions of this text may have been transcribed using voice recognition software. Every effort was made to ensure accuracy; however, inadvertent computerized transcription errors may still be present.

## 2020-04-12 NOTE — ED Provider Notes (Signed)
Medical screening examination/treatment/procedure(s) were conducted as a shared visit with non-physician practitioner(s) and myself.  I personally evaluated the patient during the encounter. Briefly, the patient is a 63 y.o. male with history of hypertension, atrial fibrillation on Eliquis and metoprolol who presents to the ED with palpitation, lightheadedness, chest pain.  Patient found to be in A. fib with RVR with EMS.  Otherwise normal vitals.  He started to feel sick after eating dinner tonight.  Had recent ablation 2 years ago and then had cardioversion several months ago for atrial fibrillation.  Took his afternoon metoprolol dose.  He is on 50 in the morning and 25 at night.  He is having some chest pain.  Likely this is from A. fib.  Will check basic labs.  Patient has already been given 20 mg of IV diltiazem and heart rate is down to the 80s.  He is no longer having chest pain.  Will give additional 25 mg of metoprolol.  Will check lab work to evaluate for any electrolyte abnormalities.  If continues to be rate controlled we will have him follow-up in A. fib clinic.  Prefers no cardioversion at this time which is reasonable.   Patient reevaluated continues to be rate controlled in the 80s.  Asymptomatic.  Lab work pending.  Anticipate discharge.   EKG Interpretation  Date/Time:  Sunday Apr 12 2020 21:05:25 EDT Ventricular Rate:  126 PR Interval:    QRS Duration: 97 QT Interval:  322 QTC Calculation: 467 R Axis:   47 Text Interpretation: Atrial fibrillation Nonspecific T abnormalities, lateral leads Confirmed by Lennice Sites [phone]) on 04/12/2020 9:10:29 PM           Lennice Sites, DO 04/12/20 2230
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
## 2020-04-12 NOTE — ED Triage Notes (Signed)
Pt BIB [facility] from home, after working outside pt began to feel dizzy and lightheaded. On EMS arrival, pt found in afib rvr, hx of the same. Given 1L NS with some improvement in HR.

## 2020-04-13 ENCOUNTER — Telehealth: Payer: Self-pay | Admitting: Student
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
## 2020-04-13 NOTE — Telephone Encounter (Signed)
New message:    Patient calling about some medication and he has some questions. Please call patient back.

## 2020-04-13 NOTE — Telephone Encounter (Signed)
I spoke to the patient who is calling because he feels that his Spironolactone 25 mg is making him feel "funny/dizzy".  He has an appointment on 5/4 with Jonni Sanger and I told him that we would further discuss.  He verbalized understanding.

## 2020-04-14 ENCOUNTER — Encounter: Payer: Self-pay | Admitting: Student

## 2020-04-14 ENCOUNTER — Other Ambulatory Visit: Payer: Self-pay

## 2020-04-14 ENCOUNTER — Ambulatory Visit (INDEPENDENT_AMBULATORY_CARE_PROVIDER_SITE_OTHER): Payer: BC Managed Care – PPO | Admitting: Student

## 2020-04-14 VITALS — BP 118/90 | HR 97 | Ht 74.0 in | Wt 271.0 lb

## 2020-04-14 DIAGNOSIS — R002 Palpitations: Secondary | ICD-10-CM | POA: Diagnosis not present

## 2020-04-14 DIAGNOSIS — I5022 Chronic systolic (congestive) heart failure: Secondary | ICD-10-CM

## 2020-04-14 DIAGNOSIS — I10 Essential (primary) hypertension: Secondary | ICD-10-CM

## 2020-04-14 DIAGNOSIS — Z01812 Encounter for preprocedural laboratory examination: Secondary | ICD-10-CM

## 2020-04-14 LAB — BASIC METABOLIC PANEL
BUN/Creatinine Ratio: 13 (ref 10–24)
BUN: 14 mg/dL (ref 8–27)
CO2: 25 mmol/L (ref 20–29)
Calcium: 10 mg/dL (ref 8.6–10.2)
Chloride: 103 mmol/L (ref 96–106)
Creatinine, Ser: 1.04 mg/dL (ref 0.76–1.27)
GFR calc Af Amer: 89 mL/min/{1.73_m2} (ref >59)
GFR calc non Af Amer: 77 mL/min/{1.73_m2} (ref >59)
Glucose: 80 mg/dL (ref 65–99)
Potassium: 4.6 mmol/L (ref 3.5–5.2)
Sodium: 140 mmol/L (ref 134–144)

## 2020-04-14 LAB — CBC
Hematocrit: 53 % — ABNORMAL HIGH (ref 37.5–51.0)
Hemoglobin: 17.7 g/dL (ref 13.0–17.7)
MCH: 30.3 pg (ref 26.6–33.0)
MCHC: 33.4 g/dL (ref 31.5–35.7)
MCV: 91 fL (ref 79–97)
Platelets: 197 10*3/uL (ref 150–450)
RBC: 5.85 x10E6/uL — ABNORMAL HIGH (ref 4.14–5.80)
RDW: 13.4 % (ref 11.6–15.4)
WBC: 9.8 10*3/uL (ref 3.4–10.8)

## 2020-04-14 NOTE — Patient Instructions (Addendum)
Medication Instructions:  NONE *If you need a refill on your cardiac medications before your next appointment, please call your pharmacy*   Lab Work:  TODAY CBC BMET If you have labs (blood work) drawn today and your tests are completely normal, you will receive your results only by: Marland Kitchen MyChart Message (if you have MyChart) OR . A paper copy in the mail If you have any lab test that is abnormal or we need to change your treatment, we will call you to review the results.   Testing/Procedures: Your physician has recommended that you have a TEE/Cardioversion (DCCV). Electrical Cardioversion uses a jolt of electricity to your heart either through paddles or wired patches attached to your chest. This is a controlled, usually prescheduled, procedure. Defibrillation is done under light anesthesia in the hospital, and you usually go home the day of the procedure. This is done to get your heart back into a normal rhythm. You are not awake for the procedure. Please see the instruction sheet given to you today.     Follow-Up: 1 week after 5/10 with [facility], you and your health needs are our priority.  As part of our continuing mission to provide you with exceptional heart care, we have created designated Provider Care Teams.  These Care Teams include your primary Cardiologist (physician) and Advanced Practice Providers (APPs -  Physician Assistants and Nurse Practitioners) who all work together to provide you with the care you need, when you need it.     Other Instructions CoVid Testing:  [facility]                           [place]                             10:00 am  Thursday 5/6 Dear Mr Douglas Chandler are scheduled for a TEE/Cardioversion on Monday 5/10  with Dr. Acie Fredrickson .  Please arrive at the [facility] (Main Entrance A) at [facility]: [address] [place], [place] [address] at 8:30 am..    DIET: Nothing to eat or drink after midnight  except a sip of water with medications (see medication instructions below)   You must have a responsible person to drive you home and stay in the waiting area during your procedure. Failure to do so could result in cancellation.  Bring your insurance cards.  *Special Note: Every effort is made to have your procedure done on time. Occasionally there are emergencies that occur at the hospital that may cause delays. Please be patient if a delay does occur.

## 2020-04-16 ENCOUNTER — Other Ambulatory Visit (HOSPITAL_COMMUNITY)
Admission: RE | Admit: 2020-04-16 | Discharge: 2020-04-16 | Disposition: A | Discharge status: Home / Self Care | Payer: BC Managed Care – PPO | Source: Ambulatory Visit | Attending: Cardiovascular Disease | Admitting: Cardiovascular Disease

## 2020-04-16 DIAGNOSIS — Z20822 Contact with and (suspected) exposure to covid-19: Secondary | ICD-10-CM | POA: Diagnosis not present

## 2020-04-16 DIAGNOSIS — Z01812 Encounter for preprocedural laboratory examination: Secondary | ICD-10-CM | POA: Insufficient documentation

## 2020-04-16 LAB — SARS CORONAVIRUS 2 (TAT 6-24 HRS): SARS Coronavirus 2: NEGATIVE

## 2020-04-20 ENCOUNTER — Other Ambulatory Visit: Payer: Self-pay

## 2020-04-20 ENCOUNTER — Encounter (HOSPITAL_COMMUNITY)
Admission: RE | Disposition: A | Discharge status: Home / Self Care | Payer: Self-pay | Source: Home / Self Care | Attending: Cardiovascular Disease

## 2020-04-20 ENCOUNTER — Encounter (HOSPITAL_COMMUNITY): Payer: Self-pay | Admitting: Cardiovascular Disease

## 2020-04-20 ENCOUNTER — Ambulatory Visit (HOSPITAL_COMMUNITY): Payer: BC Managed Care – PPO | Admitting: Anesthesiology

## 2020-04-20 ENCOUNTER — Ambulatory Visit (HOSPITAL_BASED_OUTPATIENT_CLINIC_OR_DEPARTMENT_OTHER)
Admission: RE | Admit: 2020-04-20 | Discharge: 2020-04-20 | Disposition: A | Discharge status: Home / Self Care | Payer: BC Managed Care – PPO | Source: Ambulatory Visit | Attending: Student | Admitting: Student

## 2020-04-20 ENCOUNTER — Ambulatory Visit (HOSPITAL_COMMUNITY)
Admission: RE | Admit: 2020-04-20 | Discharge: 2020-04-20 | Disposition: A | Discharge status: Home / Self Care | Payer: BC Managed Care – PPO | Attending: Cardiovascular Disease | Admitting: Cardiovascular Disease

## 2020-04-20 DIAGNOSIS — I4819 Other persistent atrial fibrillation: Secondary | ICD-10-CM | POA: Diagnosis not present

## 2020-04-20 DIAGNOSIS — I110 Hypertensive heart disease with heart failure: Secondary | ICD-10-CM | POA: Diagnosis not present

## 2020-04-20 DIAGNOSIS — I5022 Chronic systolic (congestive) heart failure: Secondary | ICD-10-CM | POA: Diagnosis not present

## 2020-04-20 DIAGNOSIS — Z888 Allergy status to other drugs, medicaments and biological substances status: Secondary | ICD-10-CM | POA: Diagnosis not present

## 2020-04-20 DIAGNOSIS — G4733 Obstructive sleep apnea (adult) (pediatric): Secondary | ICD-10-CM | POA: Diagnosis not present

## 2020-04-20 DIAGNOSIS — Z7901 Long term (current) use of anticoagulants: Secondary | ICD-10-CM | POA: Insufficient documentation

## 2020-04-20 DIAGNOSIS — Z79899 Other long term (current) drug therapy: Secondary | ICD-10-CM | POA: Diagnosis not present

## 2020-04-20 DIAGNOSIS — I340 Nonrheumatic mitral (valve) insufficiency: Secondary | ICD-10-CM | POA: Diagnosis not present

## 2020-04-20 DIAGNOSIS — I4891 Unspecified atrial fibrillation: Secondary | ICD-10-CM

## 2020-04-20 HISTORY — PX: BUBBLE STUDY: SHX6837

## 2020-04-20 HISTORY — PX: CARDIOVERSION: SHX1299

## 2020-04-20 HISTORY — PX: TEE WITHOUT CARDIOVERSION: SHX5443

## 2020-04-20 SURGERY — ECHOCARDIOGRAM, TRANSESOPHAGEAL
Anesthesia: Monitor Anesthesia Care

## 2020-04-20 MED ORDER — LIDOCAINE HCL (CARDIAC) PF 100 MG/5ML IV SOSY
PREFILLED_SYRINGE | INTRAVENOUS | Status: DC | PRN
  Administered 2020-04-20: 20 mg via INTRATRACHEAL

## 2020-04-20 MED ORDER — BUTAMBEN-TETRACAINE-BENZOCAINE 2-2-14 % EX AERO
INHALATION_SPRAY | CUTANEOUS | Status: DC | PRN
  Administered 2020-04-20: 2 via TOPICAL

## 2020-04-20 MED ORDER — SODIUM CHLORIDE 0.9 % IV SOLN
INTRAVENOUS | Status: DC
  Administered 2020-04-20: 500 mL via INTRAVENOUS

## 2020-04-20 MED ORDER — PROPOFOL 10 MG/ML IV BOLUS
INTRAVENOUS | Status: DC | PRN
Start: 2020-04-20 — End: 2020-04-20
  Administered 2020-04-20: 80 ug/kg/min via INTRAVENOUS
  Administered 2020-04-20: 25 ug/kg/min via INTRAVENOUS

## 2020-04-20 NOTE — Anesthesia Preprocedure Evaluation (Addendum)
Anesthesia Evaluation  Patient identified by MRN, date of birth, ID band Patient awake    Reviewed: Allergy & Precautions, NPO status , Patient's Chart, lab work & pertinent test results, reviewed documented beta blocker date and time   Airway Mallampati: II  TM Distance: >3 FB Neck ROM: Full    Dental  (+) Dental Advisory Given, Missing   Pulmonary sleep apnea and Continuous Positive Airway Pressure Ventilation ,    Pulmonary exam normal breath sounds clear to auscultation       Cardiovascular hypertension, Pt. on medications and Pt. on home beta blockers + dysrhythmias Atrial Fibrillation  Rhythm:Irregular Rate:Abnormal  Echo 3/21: 1. LV systolic function is difficult to assess given patient is in atrial fibrillation with RVR and has poor windows, but EF appears moderately reduced. Left ventricular ejection fraction, by estimation, is 30 to 35%. The left ventricle has moderately decreased function. The left ventricular internal cavity size was mildly dilated. There is mild left ventricular hypertrophy. Left ventricular diastolic parameters are indeterminate.  2. Right ventricle is not well-visualized but right ventricular systolic function appears grossly normal. The right ventricular size is mildly enlarged. There is mildly elevated pulmonary artery systolic pressure. The  estimated right ventricular systolic pressure is 32.7 mmHg.  3. The mitral valve is normal in structure and function. No evidence of mitral valve regurgitation.  4. The aortic valve is tricuspid. Aortic valve regurgitation is not visualized. No aortic stenosis is present.  5. Aortic dilatation noted. 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 Left atrial size was severely dilated.  7. Right atrial size was mildly dilated.  8. The inferior vena cava is dilated in size with <50% respiratory  variability, suggesting right atrial pressure of 15 mmHg.    Neuro/Psych  Headaches, negative psych ROS   GI/Hepatic negative GI ROS, Neg liver ROS,   Endo/Other  negative endocrine ROS  Renal/GU negative Renal ROS     Musculoskeletal negative musculoskeletal ROS (+)   Abdominal   Peds  Hematology  (+) Blood dyscrasia (Eliquis), ,   Anesthesia Other Findings Day of surgery medications reviewed with the patient.  Reproductive/Obstetrics                          Anesthesia Physical Anesthesia Plan  ASA: III  Anesthesia Plan: MAC   Post-op Pain Management:    Induction: Intravenous  PONV Risk Score and Plan: 1 and Propofol infusion and Treatment may vary due to age or medical condition  Airway Management Planned: Natural Airway and Nasal Cannula  Additional Equipment:   Intra-op Plan:   Post-operative Plan:   Informed Consent: I have reviewed the patients History and Physical, chart, labs and discussed the procedure including the risks, benefits and alternatives for the proposed anesthesia with the patient or authorized representative who has indicated his/her understanding and acceptance.     Dental advisory given  Plan Discussed with: CRNA  Anesthesia Plan Comments:        Anesthesia Quick Evaluation

## 2020-04-20 NOTE — CV Procedure (Signed)
    Transesophageal Echocardiogram Note  Douglas Chandler [id number] [birth year]  Procedure: Transesophageal Echocardiogram Indications: Atrial fib   Procedure Details Consent: Obtained Time Out: Verified patient identification, verified procedure, site/side was marked, verified correct patient position, special equipment/implants available, Radiology Safety Procedures followed,  medications/allergies/relevent history reviewed, required imaging and test results available.  Performed  Medications:  During this procedure the patient is administered a  Propofol drip by [facility] , CRNA .   Lidocaine 20 mg IV then a total of Propofol 207 mg IV was given for the TEE and cardioversion   Left Ventrical:  Mild - mod LV dysfunction .  EF 35-40%.   Mitral Valve: trace - mild MR   Aortic Valve: normal 3 leaflet valve   Tricuspid Valve:  Normal.  Trace - mild TR   Pulmonic Valve: grossly normal,  Trivial PI   Left Atrium/ Left atrial appendage: extensive spontaneous contrast ("smoke" in the LA and LAA.  There were no thrombi in the LA or LAA  Atrial septum:  There was evidence of left to right flow across a PFO by color doppler.   Bubble study was done.  There was no evidence of right to left shunting by bubble study   Aorta: mild calcified plaque    Complications: No apparent complications Patient did tolerate procedure well.       Cardioversion Note  Douglas Chandler [id number] [birth year]  Procedure: DC Cardioversion Indications: atrial fib   Procedure Details Consent: Obtained Time Out: Verified patient identification, verified procedure, site/side was marked, verified correct patient position, special equipment/implants available, Radiology Safety Procedures followed,  medications/allergies/relevent history reviewed, required imaging and test results available.  Performed  The patient has been on adequate anticoagulation.  The patient received IV propofol drip ( see above )  for  sedation.  Synchronous cardioversion was performed at 200  joules.  The cardioversion was successful     Complications: No apparent complications Patient did tolerate procedure well.   Thayer Headings, Brooke Bonito., MD, [facility] 04/20/2020, 9:44 AM

## 2020-04-20 NOTE — Anesthesia Postprocedure Evaluation (Signed)
Anesthesia Post Note  Patient: Douglas Chandler  Procedure(s) Performed: TRANSESOPHAGEAL ECHOCARDIOGRAM (TEE) (N/A ) CARDIOVERSION (N/A ) BUBBLE STUDY     Patient location during evaluation: PACU Anesthesia Type: MAC Level of consciousness: awake and alert Pain management: pain level controlled Vital Signs Assessment: post-procedure vital signs reviewed and stable Respiratory status: spontaneous breathing, nonlabored ventilation, respiratory function stable and patient connected to nasal cannula oxygen Cardiovascular status: stable and blood pressure returned to baseline Postop Assessment: no apparent nausea or vomiting Anesthetic complications: no    Last Vitals:  Vitals:   04/20/20 1000 04/20/20 1002  BP: 96/77 106/77  Pulse: 65 63  Resp: 19 13  Temp:    SpO2: 99% 98%    Last Pain:  Vitals:   04/20/20 1002  TempSrc:   PainSc: 0-No pain                 Catalina Gravel

## 2020-04-20 NOTE — Anesthesia Procedure Notes (Signed)
Procedure Name: General with mask airway Date/Time: 04/20/2020 9:31 AM Performed by: Lavell Luster, CRNA Pre-anesthesia Checklist: Patient identified, Emergency Drugs available, Suction available, Patient being monitored and Timeout performed Patient Re-evaluated:Patient Re-evaluated prior to induction Oxygen Delivery Method: Ambu bag Induction Type: IV induction Ventilation: Mask ventilation without difficulty Placement Confirmation: breath sounds checked- equal and bilateral and positive ETCO2 Dental Injury: Teeth and Oropharynx as per pre-operative assessment

## 2020-04-20 NOTE — Interval H&P Note (Signed)
History and Physical Interval Note:  04/20/2020 9:06 AM  Douglas Chandler  has presented today for surgery, with the diagnosis of PERSISTANT AFIB.  The various methods of treatment have been discussed with the patient and family. After consideration of risks, benefits and other options for treatment, the patient has consented to  Procedure(s): TRANSESOPHAGEAL ECHOCARDIOGRAM (TEE) (N/A) CARDIOVERSION (N/A) as a surgical intervention.  The patient's history has been reviewed, patient examined, no change in status, stable for surgery.  I have reviewed the patient's chart and labs.  Questions were answered to the patient's satisfaction.     Mertie Moores

## 2020-04-20 NOTE — Progress Notes (Signed)
  Echocardiogram Echocardiogram Transesophageal has been performed.  Michiel Cowboy 04/20/2020, 9:44 AM

## 2020-04-20 NOTE — Discharge Instructions (Signed)

## 2020-04-20 NOTE — Transfer of Care (Signed)
Immediate Anesthesia Transfer of Care Note  Patient: Douglas Chandler  Procedure(s) Performed: TRANSESOPHAGEAL ECHOCARDIOGRAM (TEE) (N/A ) CARDIOVERSION (N/A )  Patient Location: Endoscopy Unit  Anesthesia Type:General  Level of Consciousness: awake, alert  and sedated  Airway & Oxygen Therapy: Patient connected to nasal cannula oxygen  Post-op Assessment: Post -op Vital signs reviewed and stable  Post vital signs: stable  Last Vitals:  Vitals Value Taken Time  BP    Temp    Pulse    Resp    SpO2      Last Pain:  Vitals:   04/20/20 0855  TempSrc: Oral  PainSc: 0-No pain         Complications: No apparent anesthesia complications

## 2020-04-22 ENCOUNTER — Other Ambulatory Visit: Payer: Self-pay

## 2020-04-22 ENCOUNTER — Other Ambulatory Visit: Payer: BC Managed Care – PPO | Admitting: *Deleted

## 2020-04-22 DIAGNOSIS — I5022 Chronic systolic (congestive) heart failure: Secondary | ICD-10-CM

## 2020-04-22 DIAGNOSIS — I493 Ventricular premature depolarization: Secondary | ICD-10-CM

## 2020-04-22 DIAGNOSIS — I4891 Unspecified atrial fibrillation: Secondary | ICD-10-CM

## 2020-04-22 DIAGNOSIS — R002 Palpitations: Secondary | ICD-10-CM

## 2020-04-22 DIAGNOSIS — I10 Essential (primary) hypertension: Secondary | ICD-10-CM

## 2020-04-23 LAB — BASIC METABOLIC PANEL
BUN/Creatinine Ratio: 15 (ref 10–24)
BUN: 15 mg/dL (ref 8–27)
CO2: 25 mmol/L (ref 20–29)
Calcium: 10.1 mg/dL (ref 8.6–10.2)
Chloride: 102 mmol/L (ref 96–106)
Creatinine, Ser: 0.99 mg/dL (ref 0.76–1.27)
GFR calc Af Amer: 95 mL/min/{1.73_m2} (ref >59)
GFR calc non Af Amer: 82 mL/min/{1.73_m2} (ref >59)
Glucose: 73 mg/dL (ref 65–99)
Potassium: 4.5 mmol/L (ref 3.5–5.2)
Sodium: 140 mmol/L (ref 134–144)

## 2020-04-30 ENCOUNTER — Ambulatory Visit (HOSPITAL_COMMUNITY)
Admission: RE | Admit: 2020-04-30 | Discharge: 2020-04-30 | Disposition: A | Discharge status: Home / Self Care | Payer: BC Managed Care – PPO | Source: Ambulatory Visit | Attending: Nurse Practitioner | Admitting: Nurse Practitioner

## 2020-04-30 ENCOUNTER — Other Ambulatory Visit: Payer: Self-pay

## 2020-04-30 VITALS — BP 114/66 | HR 70 | Ht 74.0 in | Wt 264.4 lb

## 2020-04-30 DIAGNOSIS — Z888 Allergy status to other drugs, medicaments and biological substances status: Secondary | ICD-10-CM | POA: Diagnosis not present

## 2020-04-30 DIAGNOSIS — Z791 Long term (current) use of non-steroidal anti-inflammatories (NSAID): Secondary | ICD-10-CM | POA: Diagnosis not present

## 2020-04-30 DIAGNOSIS — Q211 Atrial septal defect: Secondary | ICD-10-CM | POA: Diagnosis not present

## 2020-04-30 DIAGNOSIS — Z9989 Dependence on other enabling machines and devices: Secondary | ICD-10-CM | POA: Insufficient documentation

## 2020-04-30 DIAGNOSIS — D6869 Other thrombophilia: Secondary | ICD-10-CM | POA: Diagnosis not present

## 2020-04-30 DIAGNOSIS — I4891 Unspecified atrial fibrillation: Secondary | ICD-10-CM | POA: Insufficient documentation

## 2020-04-30 DIAGNOSIS — Z7901 Long term (current) use of anticoagulants: Secondary | ICD-10-CM | POA: Diagnosis not present

## 2020-04-30 DIAGNOSIS — I10 Essential (primary) hypertension: Secondary | ICD-10-CM | POA: Insufficient documentation

## 2020-04-30 DIAGNOSIS — I483 Typical atrial flutter: Secondary | ICD-10-CM | POA: Insufficient documentation

## 2020-04-30 DIAGNOSIS — G4730 Sleep apnea, unspecified: Secondary | ICD-10-CM | POA: Insufficient documentation

## 2020-04-30 DIAGNOSIS — E669 Obesity, unspecified: Secondary | ICD-10-CM | POA: Insufficient documentation

## 2020-04-30 DIAGNOSIS — Z79899 Other long term (current) drug therapy: Secondary | ICD-10-CM | POA: Diagnosis not present

## 2020-05-01 ENCOUNTER — Encounter (HOSPITAL_COMMUNITY): Payer: Self-pay | Admitting: Nurse Practitioner
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
## 2020-05-05 ENCOUNTER — Encounter: Payer: Self-pay | Admitting: Gastroenterology

## 2020-05-12 ENCOUNTER — Ambulatory Visit (HOSPITAL_COMMUNITY): Payer: BC Managed Care – PPO

## 2020-05-12 ENCOUNTER — Other Ambulatory Visit: Payer: Self-pay

## 2020-05-13 ENCOUNTER — Encounter (HOSPITAL_COMMUNITY): Payer: Self-pay

## 2020-05-13 ENCOUNTER — Telehealth: Payer: Self-pay | Admitting: Pharmacist

## 2020-05-13 NOTE — Telephone Encounter (Signed)
Medication list reviewed in anticipation of upcoming Tikosyn initiation. Patient is not taking any contraindicated or QTc prolonging medications.   Patient is anticoagulated on Eliquis on the appropriate dose. Please ensure that patient has not missed any anticoagulation doses in the 3 weeks prior to Tikosyn initiation.   Patient will need to be counseled to avoid use of Benadryl while on Tikosyn and in the 2-3 days prior to Tikosyn initiation.  

## 2020-05-15 ENCOUNTER — Telehealth (HOSPITAL_COMMUNITY): Payer: Self-pay

## 2020-05-15 NOTE — Telephone Encounter (Signed)
Called patient insurance to do Metallurgist. Representative Threasa Beards stated that her pre-certification system is down will call back once system is up. Reference #[id number].

## 2020-05-18 ENCOUNTER — Emergency Department (HOSPITAL_COMMUNITY)
Admission: EM | Admit: 2020-05-18 | Discharge: 2020-05-19 | Disposition: A | Discharge status: Home / Self Care | Payer: BC Managed Care – PPO | Attending: Emergency Medicine | Admitting: Emergency Medicine

## 2020-05-18 ENCOUNTER — Other Ambulatory Visit: Payer: Self-pay

## 2020-05-18 ENCOUNTER — Emergency Department (HOSPITAL_COMMUNITY): Payer: BC Managed Care – PPO

## 2020-05-18 ENCOUNTER — Encounter (HOSPITAL_COMMUNITY): Payer: Self-pay

## 2020-05-18 DIAGNOSIS — I10 Essential (primary) hypertension: Secondary | ICD-10-CM | POA: Diagnosis not present

## 2020-05-18 DIAGNOSIS — Z7901 Long term (current) use of anticoagulants: Secondary | ICD-10-CM | POA: Diagnosis not present

## 2020-05-18 DIAGNOSIS — R42 Dizziness and giddiness: Secondary | ICD-10-CM | POA: Diagnosis not present

## 2020-05-18 DIAGNOSIS — I4891 Unspecified atrial fibrillation: Secondary | ICD-10-CM | POA: Diagnosis not present

## 2020-05-18 NOTE — ED Triage Notes (Signed)
Pt BIB [facility] for eval of weakness x 2 hours. EMS reports neg stroke screen. Pt reports severe dizziness on standing and sitting upright. Resolves with laying down. Pt is neuro intact in triage aside from positional dizziness. Denies head injury, trauma. GCS 15. Denies CP/SOB

## 2020-05-19 LAB — CBC
HCT: 45.1 % (ref 39.0–52.0)
Hemoglobin: 15.7 g/dL (ref 13.0–17.0)
MCH: 31.5 pg (ref 26.0–34.0)
MCHC: 34.8 g/dL (ref 30.0–36.0)
MCV: 90.4 fL (ref 80.0–100.0)
Platelets: 177 10*3/uL (ref 150–400)
RBC: 4.99 MIL/uL (ref 4.22–5.81)
RDW: 13.2 % (ref 11.5–15.5)
WBC: 9.1 10*3/uL (ref 4.0–10.5)
nRBC: 0 % (ref 0.0–0.2)

## 2020-05-19 LAB — CBG MONITORING, ED: Glucose-Capillary: 95 mg/dL (ref 70–99)

## 2020-05-19 LAB — URINALYSIS, ROUTINE W REFLEX MICROSCOPIC
Bilirubin Urine: NEGATIVE
Glucose, UA: NEGATIVE mg/dL
Hgb urine dipstick: NEGATIVE
Ketones, ur: NEGATIVE mg/dL
Leukocytes,Ua: NEGATIVE
Nitrite: NEGATIVE
Protein, ur: NEGATIVE mg/dL
Specific Gravity, Urine: 1.016 (ref 1.005–1.030)
pH: 6 (ref 5.0–8.0)

## 2020-05-19 LAB — BASIC METABOLIC PANEL
Anion gap: 9 (ref 5–15)
BUN: 19 mg/dL (ref 8–23)
CO2: 24 mmol/L (ref 22–32)
Calcium: 9.2 mg/dL (ref 8.9–10.3)
Chloride: 104 mmol/L (ref 98–111)
Creatinine, Ser: 0.94 mg/dL (ref 0.61–1.24)
GFR calc Af Amer: >60 mL/min (ref >60)
GFR calc non Af Amer: >60 mL/min (ref >60)
Glucose, Bld: 123 mg/dL — ABNORMAL HIGH (ref 70–99)
Potassium: 4.3 mmol/L (ref 3.5–5.1)
Sodium: 137 mmol/L (ref 135–145)

## 2020-05-19 MED ORDER — SODIUM CHLORIDE 0.9 % IV BOLUS
1000.0000 mL | Freq: Once | INTRAVENOUS | Status: AC
  Administered 2020-05-19: 1000 mL via INTRAVENOUS

## 2020-05-19 MED ORDER — ONDANSETRON 4 MG PO TBDP: 4.0000 mg | ORAL_TABLET | Freq: Three times a day (TID) | ORAL | 0 refills | Status: DC | PRN

## 2020-05-19 MED ORDER — MECLIZINE HCL 25 MG PO TABS
25.0000 mg | ORAL_TABLET | Freq: Three times a day (TID) | ORAL | 0 refills | Status: DC | PRN
Start: 2020-05-19 — End: 2020-12-28

## 2020-05-19 MED ORDER — ONDANSETRON HCL 4 MG/2ML IJ SOLN
4.0000 mg | Freq: Once | INTRAMUSCULAR | Status: AC
  Administered 2020-05-19: 4 mg via INTRAVENOUS
  Filled 2020-05-19: qty 2
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
## 2020-05-20 ENCOUNTER — Encounter: Payer: Self-pay | Admitting: Adult Health

## 2020-05-20 ENCOUNTER — Other Ambulatory Visit: Payer: Self-pay

## 2020-05-20 ENCOUNTER — Encounter (HOSPITAL_BASED_OUTPATIENT_CLINIC_OR_DEPARTMENT_OTHER): Payer: Self-pay | Admitting: *Deleted

## 2020-05-20 ENCOUNTER — Emergency Department (HOSPITAL_BASED_OUTPATIENT_CLINIC_OR_DEPARTMENT_OTHER)
Admission: EM | Admit: 2020-05-20 | Discharge: 2020-05-20 | Disposition: A | Discharge status: Home / Self Care | Payer: BC Managed Care – PPO | Attending: Emergency Medicine | Admitting: Emergency Medicine

## 2020-05-20 DIAGNOSIS — K5900 Constipation, unspecified: Secondary | ICD-10-CM | POA: Diagnosis not present

## 2020-05-20 DIAGNOSIS — K625 Hemorrhage of anus and rectum: Secondary | ICD-10-CM

## 2020-05-20 DIAGNOSIS — Z79899 Other long term (current) drug therapy: Secondary | ICD-10-CM | POA: Diagnosis not present

## 2020-05-20 DIAGNOSIS — I10 Essential (primary) hypertension: Secondary | ICD-10-CM | POA: Diagnosis not present

## 2020-05-20 DIAGNOSIS — K6289 Other specified diseases of anus and rectum: Secondary | ICD-10-CM | POA: Diagnosis present

## 2020-05-20 LAB — CBC WITH DIFFERENTIAL/PLATELET
Abs Immature Granulocytes: 0.02 10*3/uL (ref 0.00–0.07)
Basophils Absolute: 0.1 10*3/uL (ref 0.0–0.1)
Basophils Relative: 1 %
Eosinophils Absolute: 0.2 10*3/uL (ref 0.0–0.5)
Eosinophils Relative: 2 %
HCT: 45.8 % (ref 39.0–52.0)
Hemoglobin: 15.8 g/dL (ref 13.0–17.0)
Immature Granulocytes: 0 %
Lymphocytes Relative: 27 %
Lymphs Abs: 2.3 10*3/uL (ref 0.7–4.0)
MCH: 30.7 pg (ref 26.0–34.0)
MCHC: 34.5 g/dL (ref 30.0–36.0)
MCV: 89.1 fL (ref 80.0–100.0)
Monocytes Absolute: 0.7 10*3/uL (ref 0.1–1.0)
Monocytes Relative: 8 %
Neutro Abs: 5.3 10*3/uL (ref 1.7–7.7)
Neutrophils Relative %: 62 %
Platelets: 177 10*3/uL (ref 150–400)
RBC: 5.14 MIL/uL (ref 4.22–5.81)
RDW: 13.2 % (ref 11.5–15.5)
WBC: 8.5 10*3/uL (ref 4.0–10.5)
nRBC: 0 % (ref 0.0–0.2)

## 2020-05-20 LAB — BASIC METABOLIC PANEL
Anion gap: 9 (ref 5–15)
BUN: 12 mg/dL (ref 8–23)
CO2: 25 mmol/L (ref 22–32)
Calcium: 9.4 mg/dL (ref 8.9–10.3)
Chloride: 102 mmol/L (ref 98–111)
Creatinine, Ser: 0.98 mg/dL (ref 0.61–1.24)
GFR calc Af Amer: >60 mL/min (ref >60)
GFR calc non Af Amer: >60 mL/min (ref >60)
Glucose, Bld: 92 mg/dL (ref 70–99)
Potassium: 4.3 mmol/L (ref 3.5–5.1)
Sodium: 136 mmol/L (ref 135–145)
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
## 2020-05-20 NOTE — ED Triage Notes (Signed)
Pt c/o rectal pain and bleeding with BM  x 1 episode

## 2020-05-20 NOTE — Discharge Instructions (Signed)
I alsoYou may use Preparation H suppositories and wipes.  Recommend increasing your fiber intake in the form of vegetables and drink plenty of water.  Cleanout:  Miralax cleanout 5 capfuls in 24-36 oz gatorade, drink over 4-6 hrs. If stool is not clear after 24 hours, then repeat this dose for a second day.   After Cleanout:  Give Miralax 1 capful in 8 oz juice or gatorade daily for at least 4-6 weeks.  Schedule follow up with pediatrician if no improvement in constipation in 1-2 months, sooner if not resolved after cleanout.  -------------------------------------------------------------------------------------------- GETTING TO [place]. Irregular bowel habits such as constipation and diarrhea can lead to many problems over time.  Having one soft bowel movement a day is the most important way to prevent further problems.  The anorectal canal is designed to handle stretching and feces to safely manage our ability to get rid of solid waste (feces, poop, stool) out of our body.  BUT, hard constipated stools can act like ripping concrete bricks and diarrhea can be a burning fire to this very sensitive area of our body, causing inflamed hemorrhoids, anal fissures, increasing risk is perirectal abscesses, abdominal pain/bloating, an making irritable bowel worse.     The goal: ONE SOFT BOWEL MOVEMENT A DAY!  To have soft, regular bowel movements:  Drink at least 8 tall glasses of water a day.   Take plenty of fiber.  Fiber is the undigested part of plant food that passes into the colon, acting s "natures broom" to encourage bowel motility and movement.  Fiber can absorb and hold large amounts of water. This results in a larger, bulkier stool, which is soft and easier to pass. Work gradually over several weeks up to 6 servings a day of fiber (25g a day even more if needed) in the form of: Vegetables -- Root (potatoes, carrots, turnips), leafy green (lettuce, salad greens, celery, spinach), or cooked  high residue (cabbage, broccoli, etc) Fruit -- Fresh (unpeeled skin & pulp), Dried (prunes, apricots, cherries, etc ),  or stewed ( applesauce)  Whole grain breads, pasta, etc (whole wheat)  Bran cereals  Bulking Agents -- This type of water-retaining fiber generally is easily obtained each day by one of the following:  Psyllium bran -- The psyllium plant is remarkable because its ground seeds can retain so much water. This product is available as Metamucil, Konsyl, Effersyllium, Per Diem Fiber, or the less expensive generic preparation in drug and health food stores. Although labeled a laxative, it really is not a laxative.  Methylcellulose -- This is another fiber derived from wood which also retains water. It is available as Citrucel. Polyethylene Glycol - and "artificial" fiber commonly called Miralax or Glycolax.  It is helpful for people with gassy or bloated feelings with regular fiber Flax Seed - a less gassy fiber than psyllium No reading or other relaxing activity while on the toilet. If bowel movements take longer than 5 minutes, you are too constipated AVOID CONSTIPATION.  High fiber and water intake usually takes care of this.  Sometimes a laxative is needed to stimulate more frequent bowel movements, but  Laxatives are not a good long-term solution as it can wear the colon out. Osmotics (Milk of Magnesia, Fleets phosphosoda, Magnesium citrate, MiraLax, GoLytely) are safer than  Stimulants (Senokot, Castor Oil, Dulcolax, Ex Lax)    Do not take laxatives for more than 7days in a row.  IF SEVERELY CONSTIPATED, try a Bowel Retraining Program: Do not use  laxatives.  Eat a diet high in roughage, such as bran cereals and leafy vegetables.  Drink six (6) ounces of prune or apricot juice each morning.  Eat two (2) large servings of stewed fruit each day.  Take one (1) heaping tablespoon of a psyllium-based bulking agent twice a day. Use sugar-free sweetener when possible to avoid excessive  calories.  Eat a normal breakfast.  Set aside 15 minutes after breakfast to sit on the toilet, but do not strain to have a bowel movement.  If you do not have a bowel movement by the third day, use an enema and repeat the above steps.  Controlling diarrhea Switch to liquids and simpler foods for a few days to avoid stressing your intestines further. Avoid dairy products (especially milk & ice cream) for a short time.  The intestines often can lose the ability to digest lactose when stressed. Avoid foods that cause gassiness or bloating.  Typical foods include beans and other legumes, cabbage, broccoli, and dairy foods.  Every person has some sensitivity to other foods, so listen to our body and avoid those foods that trigger problems for you. Adding fiber (Citrucel, Metamucil, psyllium, Miralax) gradually can help thicken stools by absorbing excess fluid and retrain the intestines to act more normally.  Slowly increase the dose over a few weeks.  Too much fiber too soon can backfire and cause cramping & bloating. Probiotics (such as active yogurt, Align, etc) may help repopulate the intestines and colon with normal bacteria and calm down a sensitive digestive tract.  Most studies show it to be of mild help, though, and such products can be costly. Medicines: Bismuth subsalicylate (ex. Kayopectate, Pepto Bismol) every 30 minutes for up to 6 doses can help control diarrhea.  Avoid if pregnant. Loperamide (Immodium) can slow down diarrhea.  Start with two tablets (4mg  total) first and then try one tablet every 6 hours.  Avoid if you are having fevers or severe pain.  If you are not better or start feeling worse, stop all medicines and call your doctor for advice Call your doctor if you are getting worse or not better.  Sometimes further testing (cultures, endoscopy, X-ray studies, bloodwork, etc) may be needed to help diagnose and treat the cause of the diarrhea.  Managing Pain  Pain after surgery or  related to activity is often due to strain/injury to muscle, tendon, nerves and/or incisions.  This pain is usually short-term and will improve in a few months.   Many people find it helpful to do the following things TOGETHER to help speed the process of healing and to get back to regular activity more quickly:  Avoid heavy physical activity  no lifting greater than 20 pounds Do not "push through" the pain.  Listen to your body and avoid positions and maneuvers than reproduce the pain Walking is okay as tolerated, but go slowly and stop when getting sore.  Remember: If it hurts to do it, then don't do it! Take Anti-inflammatory medication  Take with food/snack around the clock for 1-2 weeks This helps the muscle and nerve tissues become less irritable and calm down faster Choose ONE of the following over-the-counter medications: Naproxen 220mg  tabs (ex. Aleve) 1-2 pills twice a day  Ibuprofen 200mg  tabs (ex. Advil, Motrin) 3-4 pills with every meal and just before bedtime Acetaminophen 500mg  tabs (Tylenol) 1-2 pills with every meal and just before bedtime Use a Heating pad or Ice/Cold Pack 4-6 times a day May use warm bath/hottub  or showers Try Gentle Massage and/or Stretching  at the area of pain many times a day stop if you feel pain - do not overdo it  Try these steps together to help you body heal faster and avoid making things get worse.  Doing just one of these things may not be enough.    If you are not getting better after two weeks or are noticing you are getting worse, contact our office for further advice; we may need to re-evaluate you & see what other things we can do to help.

## 2020-05-21 ENCOUNTER — Other Ambulatory Visit: Payer: Self-pay

## 2020-05-21 ENCOUNTER — Telehealth (HOSPITAL_COMMUNITY): Payer: Self-pay | Admitting: *Deleted

## 2020-05-21 ENCOUNTER — Telehealth: Payer: Self-pay | Admitting: Adult Health

## 2020-05-21 NOTE — Telephone Encounter (Signed)
Spoke to the pt and advised there are no openings today.  He will keep his 3 PM appointment for 05/22/20.  He was unsure what to do for the constipation and possible hemorrhoid.  Advised he can get some OTC Miralax and PreparationH.  He should follow the instructions on the packaging.  Will forward to [facility] as [place].

## 2020-05-21 NOTE — Telephone Encounter (Signed)
Patient approved for preauthorization of inpatient hospital stay Tracking Number [id number] for admission 06/02/20 submit additional information beginning 6/23

## 2020-05-21 NOTE — Telephone Encounter (Signed)
Pt is calling in stating that he need to be seen today after going to the hospital.  He states that he is not able to go to the bathroom and would like to see if Tommi Rumps can work him in today to assist him with is issue.  Pt is aware that Tommi Rumps does not have anything.  Pt would like to have a call back.

## 2020-05-22 ENCOUNTER — Encounter: Payer: Self-pay | Admitting: Adult Health

## 2020-05-22 ENCOUNTER — Ambulatory Visit (INDEPENDENT_AMBULATORY_CARE_PROVIDER_SITE_OTHER): Payer: BC Managed Care – PPO | Admitting: Adult Health

## 2020-05-22 VITALS — BP 110/54 | Temp 98.0°F | Wt 262.0 lb

## 2020-05-22 DIAGNOSIS — K5900 Constipation, unspecified: Secondary | ICD-10-CM | POA: Diagnosis not present

## 2020-05-22 DIAGNOSIS — K625 Hemorrhage of anus and rectum: Secondary | ICD-10-CM | POA: Diagnosis not present
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
## 2020-05-30 ENCOUNTER — Other Ambulatory Visit (HOSPITAL_COMMUNITY)
Admission: RE | Admit: 2020-05-30 | Discharge: 2020-05-30 | Disposition: A | Discharge status: Home / Self Care | Payer: BC Managed Care – PPO | Source: Ambulatory Visit | Attending: Internal Medicine | Admitting: Internal Medicine

## 2020-05-30 DIAGNOSIS — Z01812 Encounter for preprocedural laboratory examination: Secondary | ICD-10-CM | POA: Insufficient documentation

## 2020-05-30 DIAGNOSIS — Z20822 Contact with and (suspected) exposure to covid-19: Secondary | ICD-10-CM | POA: Insufficient documentation

## 2020-05-30 LAB — SARS CORONAVIRUS 2 (TAT 6-24 HRS): SARS Coronavirus 2: NEGATIVE

## 2020-06-02 ENCOUNTER — Other Ambulatory Visit: Payer: Self-pay

## 2020-06-02 ENCOUNTER — Ambulatory Visit (HOSPITAL_COMMUNITY)
Admission: RE | Admit: 2020-06-02 | Discharge: 2020-06-02 | Disposition: A | Discharge status: Home / Self Care | Payer: BC Managed Care – PPO | Source: Ambulatory Visit | Attending: Nurse Practitioner | Admitting: Nurse Practitioner

## 2020-06-02 ENCOUNTER — Inpatient Hospital Stay (HOSPITAL_COMMUNITY)
Admission: RE | Admit: 2020-06-02 | Discharge: 2020-06-05 | DRG: 309 | Disposition: A | Discharge status: Home / Self Care | Payer: BC Managed Care – PPO | Source: Ambulatory Visit | Attending: Internal Medicine | Admitting: Internal Medicine

## 2020-06-02 ENCOUNTER — Encounter (HOSPITAL_COMMUNITY): Payer: Self-pay | Admitting: Internal Medicine

## 2020-06-02 ENCOUNTER — Encounter (HOSPITAL_COMMUNITY): Payer: Self-pay | Admitting: Nurse Practitioner

## 2020-06-02 VITALS — BP 118/60 | HR 65 | Ht 75.0 in | Wt 265.4 lb

## 2020-06-02 DIAGNOSIS — I5022 Chronic systolic (congestive) heart failure: Secondary | ICD-10-CM | POA: Diagnosis present

## 2020-06-02 DIAGNOSIS — I4819 Other persistent atrial fibrillation: Secondary | ICD-10-CM | POA: Diagnosis not present

## 2020-06-02 DIAGNOSIS — Z833 Family history of diabetes mellitus: Secondary | ICD-10-CM | POA: Diagnosis not present

## 2020-06-02 DIAGNOSIS — I480 Paroxysmal atrial fibrillation: Secondary | ICD-10-CM

## 2020-06-02 DIAGNOSIS — Z808 Family history of malignant neoplasm of other organs or systems: Secondary | ICD-10-CM

## 2020-06-02 DIAGNOSIS — I10 Essential (primary) hypertension: Secondary | ICD-10-CM

## 2020-06-02 DIAGNOSIS — I110 Hypertensive heart disease with heart failure: Secondary | ICD-10-CM | POA: Diagnosis present

## 2020-06-02 DIAGNOSIS — E669 Obesity, unspecified: Secondary | ICD-10-CM | POA: Diagnosis present

## 2020-06-02 DIAGNOSIS — Z7901 Long term (current) use of anticoagulants: Secondary | ICD-10-CM | POA: Diagnosis not present

## 2020-06-02 DIAGNOSIS — G4730 Sleep apnea, unspecified: Secondary | ICD-10-CM | POA: Diagnosis present

## 2020-06-02 DIAGNOSIS — Z8249 Family history of ischemic heart disease and other diseases of the circulatory system: Secondary | ICD-10-CM | POA: Diagnosis not present

## 2020-06-02 DIAGNOSIS — I483 Typical atrial flutter: Secondary | ICD-10-CM | POA: Diagnosis present

## 2020-06-02 DIAGNOSIS — I4891 Unspecified atrial fibrillation: Secondary | ICD-10-CM | POA: Diagnosis present

## 2020-06-02 DIAGNOSIS — Z20822 Contact with and (suspected) exposure to covid-19: Secondary | ICD-10-CM | POA: Diagnosis present

## 2020-06-02 DIAGNOSIS — Z6833 Body mass index (BMI) 33.0-33.9, adult: Secondary | ICD-10-CM

## 2020-06-02 DIAGNOSIS — Z79899 Other long term (current) drug therapy: Secondary | ICD-10-CM

## 2020-06-02 DIAGNOSIS — Z886 Allergy status to analgesic agent status: Secondary | ICD-10-CM | POA: Diagnosis not present

## 2020-06-02 LAB — BASIC METABOLIC PANEL
Anion gap: 9 (ref 5–15)
BUN: 12 mg/dL (ref 8–23)
CO2: 29 mmol/L (ref 22–32)
Calcium: 9.7 mg/dL (ref 8.9–10.3)
Chloride: 103 mmol/L (ref 98–111)
Creatinine, Ser: 0.92 mg/dL (ref 0.61–1.24)
GFR calc Af Amer: >60 mL/min (ref >60)
GFR calc non Af Amer: >60 mL/min (ref >60)
Glucose, Bld: 113 mg/dL — ABNORMAL HIGH (ref 70–99)
Potassium: 4.3 mmol/L (ref 3.5–5.1)
Sodium: 141 mmol/L (ref 135–145)

## 2020-06-02 LAB — HIV ANTIBODY (ROUTINE TESTING W REFLEX): HIV Screen 4th Generation wRfx: NONREACTIVE

## 2020-06-02 LAB — MAGNESIUM: Magnesium: 1.8 mg/dL (ref 1.7–2.4)

## 2020-06-02 MED ORDER — SODIUM CHLORIDE 0.9 % IV SOLN
250.0000 mL | INTRAVENOUS | Status: DC | PRN
  Administered 2020-06-04: 250 mL via INTRAVENOUS

## 2020-06-02 MED ORDER — SODIUM CHLORIDE 0.9% FLUSH
3.0000 mL | Freq: Two times a day (BID) | INTRAVENOUS | Status: DC
  Administered 2020-06-02 – 2020-06-05 (×4): 3 mL via INTRAVENOUS

## 2020-06-02 MED ORDER — APIXABAN 5 MG PO TABS
5.0000 mg | ORAL_TABLET | Freq: Two times a day (BID) | ORAL | Status: DC
  Administered 2020-06-02 – 2020-06-05 (×6): 5 mg via ORAL
  Filled 2020-06-02 (×7): qty 1

## 2020-06-02 MED ORDER — METOPROLOL SUCCINATE ER 50 MG PO TB24
50.0000 mg | ORAL_TABLET | Freq: Two times a day (BID) | ORAL | Status: DC
  Administered 2020-06-02 – 2020-06-05 (×6): 50 mg via ORAL
  Filled 2020-06-02 (×7): qty 1

## 2020-06-02 MED ORDER — SPIRONOLACTONE 25 MG PO TABS
25.0000 mg | ORAL_TABLET | Freq: Every day | ORAL | Status: DC
  Administered 2020-06-03 – 2020-06-05 (×3): 25 mg via ORAL
  Filled 2020-06-02 (×3): qty 1

## 2020-06-02 MED ORDER — SACUBITRIL-VALSARTAN 24-26 MG PO TABS
1.0000 | ORAL_TABLET | Freq: Two times a day (BID) | ORAL | Status: DC
  Administered 2020-06-02 – 2020-06-05 (×6): 1 via ORAL
  Filled 2020-06-02 (×7): qty 1

## 2020-06-02 MED ORDER — SODIUM CHLORIDE 0.9% FLUSH: 3.0000 mL | INTRAVENOUS | Status: DC | PRN

## 2020-06-02 MED ORDER — DOFETILIDE 500 MCG PO CAPS
500.0000 ug | ORAL_CAPSULE | Freq: Two times a day (BID) | ORAL | Status: DC
  Administered 2020-06-02 – 2020-06-05 (×6): 500 ug via ORAL
  Filled 2020-06-02 (×6): qty 1

## 2020-06-02 MED ORDER — MAGNESIUM SULFATE 2 GM/50ML IV SOLN
2.0000 g | Freq: Once | INTRAVENOUS | Status: AC
  Administered 2020-06-02: 2 g via INTRAVENOUS
  Filled 2020-06-02: qty 50

## 2020-06-02 NOTE — TOC Benefit Eligibility Note (Signed)
Transition of Care [facility]) Benefit Eligibility Note    Patient Details  Name: Douglas Chandler MRN: [id number] Date of Birth: 10/02/1956   Medication/Dose: dofetilide  Covered?: Yes     Prescription Coverage Preferred Pharmacy: [facility]  Spoke with Person/Company/Phone Number:: [facility]  Co-Pay: $76.36 dor 30 day retail  Prior Approval: No  Deductible: Unmet ($1741.34 accumulated / $5258.66 remaining)       [place] Phone Number: 06/02/2020, 3:37 PM

## 2020-06-02 NOTE — Care Management (Signed)
06-02-20 1421 Case Manager received referral for Tikosyncost. Benefits check submitted and Case Manager will follow for cost and discuss pharmacy of choice with patient.Graves-Bigelow, Ocie Cornfield, RN, BSN Case Manager
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
## 2020-06-02 NOTE — Progress Notes (Signed)
Pharmacy: Dofetilide (Tikosyn) - Initial Consult Assessment and Electrolyte Replacement  Pharmacy consulted to assist in monitoring and replacing electrolytes in this 64 y.o. male admitted on 06/02/2020 undergoing dofetilide initiation. First dofetilide dose: 6/22@2000 .  Assessment:  Patient Exclusion Criteria: If any screening criteria checked as "Yes", then  patient  should NOT receive dofetilide until criteria item is corrected.  If "Yes" please indicate correction plan.  YES  NO Patient  Exclusion Criteria Correction Plan   []   [x]   Baseline QTc interval is greater than or equal to 440 msec. IF above YES box checked dofetilide contraindicated unless patient has ICD; then may proceed if QTc 500-550 msec or with known ventricular conduction abnormalities may proceed with QTc 550-600 msec. QTc = 428    []   [x]   Patient is known or suspected to have a digoxin level greater than 2 ng/ml: No results found for: DIGOXIN     []   [x]   Creatinine clearance less than 20 ml/min (calculated using Cockcroft-Gault, actual body weight and serum creatinine): Estimated Creatinine Clearance: 117.8 mL/min (by C-G formula based on SCr of 0.92 mg/dL).     []   [x]  Patient has received drugs known to prolong the QT intervals within the last 48 hours (phenothiazines, tricyclics or tetracyclic antidepressants, erythromycin, H-1 antihistamines, cisapride, fluoroquinolones, azithromycin). Updated information on QT prolonging agents is available to be searched on the following database:QT prolonging agents     []   [x]   Patient received a dose of hydrochlorothiazide (Oretic) alone or in any combination including triamterene (Dyazide, Maxzide) in the last 48 hours.    []   [x]  Patient received a medication known to increase dofetilide plasma concentrations prior to initial dofetilide dose:  . Trimethoprim (Primsol, Proloprim) in the last 36 hours . Verapamil (Calan, Verelan) in the last 36 hours or a  sustained release dose in the last 72 hours . Megestrol (Megace) in the last 5 days  . Cimetidine (Tagamet) in the last 6 hours . Ketoconazole (Nizoral) in the last 24 hours . Itraconazole (Sporanox) in the last 48 hours  . Prochlorperazine (Compazine) in the last 36 hours     []   [x]   Patient is known to have a history of torsades de pointes; congenital or acquired long QT syndromes.    []   [x]   Patient has received a Class 1 antiarrhythmic with less than 2 half-lives since last dose. (Disopyramide, Quinidine, Procainamide, Lidocaine, Mexiletine, Flecainide, Propafenone)    []   [x]   Patient has received amiodarone therapy in the past 3 months or amiodarone level is greater than 0.3 ng/ml.    Patient has been appropriately anticoagulated with apixaban.  Labs:    Component Value Date/Time   K 4.3 06/02/2020 1144   MG 1.8 06/02/2020 1144     Plan: Potassium: K >/= 4: Appropriate to initiate Tikosyn, no replacement needed    Magnesium: Mg 1.8-2: Give Mg 2 gm IV x1 to prevent Mg from dropping below 1.8 - do not need to recheck Mg. Appropriate to initiate Tikosyn   Thank you for allowing pharmacy to participate in this patient's care   Antonietta Jewel, PharmD, [place] Pharmacist  Phone: [phone] 06/02/2020 2:23 PM  Please check AMION for all [place] phone numbers After 10:00 PM, call [place] [phone]
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
## 2020-06-03 LAB — BASIC METABOLIC PANEL
Anion gap: 9 (ref 5–15)
BUN: 15 mg/dL (ref 8–23)
CO2: 26 mmol/L (ref 22–32)
Calcium: 9.4 mg/dL (ref 8.9–10.3)
Chloride: 103 mmol/L (ref 98–111)
Creatinine, Ser: 0.9 mg/dL (ref 0.61–1.24)
GFR calc Af Amer: >60 mL/min (ref >60)
GFR calc non Af Amer: >60 mL/min (ref >60)
Glucose, Bld: 105 mg/dL — ABNORMAL HIGH (ref 70–99)
Potassium: 4.4 mmol/L (ref 3.5–5.1)
Sodium: 138 mmol/L (ref 135–145)

## 2020-06-03 LAB — MAGNESIUM: Magnesium: 2.1 mg/dL (ref 1.7–2.4)

## 2020-06-03 NOTE — Progress Notes (Signed)
Pharmacy: Dofetilide (Tikosyn) - Follow Up Assessment and Electrolyte Replacement  Pharmacy consulted to assist in monitoring and replacing electrolytes in this 64 y.o. male admitted on 06/02/2020 undergoing dofetilide initiation. First dofetilide dose: 06/03/2020 at 2053  Labs:    Component Value Date/Time   K 4.4 06/03/2020 0302   MG 2.1 06/03/2020 0302     Plan: Potassium: K >/= 4: No additional supplementation needed  Magnesium: Mg > 2: No additional supplementation needed   Thank you for allowing pharmacy to participate in this patient's care   Cristela Felt, PharmD PGY1 Pharmacy Resident [employer]: [phone]  06/03/2020  7:05 AM

## 2020-06-03 NOTE — Progress Notes (Signed)
Morning EKG reviewed  Shows remains in NSR at 55 bpm with stable QTc at ~450 ms.  Continue Tikosyn 500 mcg BID.   Pt will not require DCCV   Annamaria Helling  Pager: [phone]  06/03/2020 11:49 AM

## 2020-06-03 NOTE — Progress Notes (Addendum)
Electrophysiology Rounding Note  Patient Name: Douglas Chandler Date of Encounter: 06/03/2020  Primary Cardiologist: No primary care provider on file.  Electrophysiologist: [name], MD    Subjective   Pt remains in NSR on Tikosyn 500 mcg BID   QTc from EKG last pm shows stable QTc at ~470 ms  The patient is doing well today.  At this time, the patient denies chest pain, shortness of breath, or any new concerns.  Inpatient Medications    Scheduled Meds: . apixaban  5 mg Oral BID  . dofetilide  500 mcg Oral BID  . metoprolol succinate  50 mg Oral BID  . sacubitril-valsartan  1 tablet Oral BID  . sodium chloride flush  3 mL Intravenous Q12H  . spironolactone  25 mg Oral Daily   Continuous Infusions: . sodium chloride     PRN Meds: sodium chloride, sodium chloride flush   Vital Signs    Vitals:   06/02/20 1407 06/02/20 1638 06/03/20 0044 06/03/20 0439  BP: (!) 148/77 121/81 101/60 118/80  Pulse: (!) 59 (!) 56 (!) 55 (!) 58  Resp: 17 17 17 17   Temp: 98 F (36.7 C) 98 F (36.7 C) 97.9 F (36.6 C) 98 F (36.7 C)  TempSrc: Oral Oral Oral Oral  SpO2: 100% 99% 98% 95%  Weight: 120.2 kg   118.9 kg  Height: 6\' 3"  (1.905 m)       Intake/Output Summary (Last 24 hours) at 06/03/2020 0744 Last data filed at 06/03/2020 0700 Gross per 24 hour  Intake 240 ml  Output 2600 ml  Net -2360 ml   Filed Weights   06/02/20 1407 06/03/20 0439  Weight: 120.2 kg 118.9 kg    Physical Exam    GEN- The patient is well appearing, alert and oriented x 3 today.   Head- normocephalic, atraumatic Eyes-  Sclera clear, conjunctiva pink Ears- hearing intact Oropharynx- clear Neck- supple Lungs- Clear to ausculation bilaterally, normal work of breathing Heart- Regular rate and rhythm, no murmurs, rubs or gallops GI- soft, NT, ND, + BS Extremities- no clubbing, cyanosis, or edema Skin- no rash or lesion Psych- euthymic mood, full affect Neuro- strength and sensation are  intact  Labs    CBC No results for input(s): WBC, NEUTROABS, HGB, HCT, MCV, PLT in the last 72 hours. Basic Metabolic Panel Recent Labs    06/02/20 1144 06/03/20 0302  NA 141 138  K 4.3 4.4  CL 103 103  CO2 29 26  GLUCOSE 113* 105*  BUN 12 15  CREATININE 0.92 0.90  CALCIUM 9.7 9.4  MG 1.8 2.1    Potassium  Date/Time Value Ref Range Status  06/03/2020 03:02 AM 4.4 3.5 - 5.1 mmol/L Final   Magnesium  Date/Time Value Ref Range Status  06/03/2020 03:02 AM 2.1 1.7 - 2.4 mg/dL Final    Comment:    Performed at [facility], [place] [address], [place], [place] [address]    Telemetry    Sinus Brady/NSR 50-60s (personally reviewed)  Radiology    No results found.   Patient Profile     TAEVYN HAUSEN is a 64 y.o. male with a past medical history significant for persistent atrial fibrillation.  They were admitted for tikosyn load.   Assessment & Plan    1. Persistent atrial fibrillation Pt remains in NSR on Tikosyn 500 mcg BID  Continue Eliquis Electrolytes stable.  CHA2DS2VASC is at least 3  2. HTN Stable  3. Chronic systolic CHF  Tolerates AF very poorly. EF 30-35%   Pt will not require DCCV.   For questions or updates, please contact [place] Please consult [email or web address] for contact info under Cardiology/STEMI.  Signed, Shirley Friar, PA-C  06/03/2020, 7:44 AM    I have seen, examined the patient, and reviewed the above assessment and plan.  Changes to above are made where necessary.  On exam, RRR.  Doing well with tikosyn No changes today  Co Sign: [name], MD

## 2020-06-04 LAB — BASIC METABOLIC PANEL
Anion gap: 9 (ref 5–15)
BUN: 15 mg/dL (ref 8–23)
CO2: 24 mmol/L (ref 22–32)
Calcium: 9.4 mg/dL (ref 8.9–10.3)
Chloride: 104 mmol/L (ref 98–111)
Creatinine, Ser: 0.85 mg/dL (ref 0.61–1.24)
GFR calc Af Amer: >60 mL/min (ref >60)
GFR calc non Af Amer: >60 mL/min (ref >60)
Glucose, Bld: 101 mg/dL — ABNORMAL HIGH (ref 70–99)
Potassium: 4 mmol/L (ref 3.5–5.1)
Sodium: 137 mmol/L (ref 135–145)

## 2020-06-04 LAB — MAGNESIUM: Magnesium: 1.8 mg/dL (ref 1.7–2.4)

## 2020-06-04 MED ORDER — MAGNESIUM OXIDE 400 (241.3 MG) MG PO TABS
400.0000 mg | ORAL_TABLET | Freq: Every day | ORAL | Status: DC
  Administered 2020-06-05: 400 mg via ORAL
  Filled 2020-06-04: qty 1

## 2020-06-04 MED ORDER — MAGNESIUM SULFATE 2 GM/50ML IV SOLN
2.0000 g | Freq: Once | INTRAVENOUS | Status: AC
  Administered 2020-06-04: 2 g via INTRAVENOUS
  Filled 2020-06-04: qty 50

## 2020-06-04 NOTE — Progress Notes (Addendum)
Electrophysiology Rounding Note  Patient Name: Douglas Chandler Date of Encounter: 06/04/2020  Primary Cardiologist: No primary care provider on file.  Electrophysiologist: [name], MD    Subjective   Pt remains in NSR on Tikosyn 500 mcg BID   QTc from EKG last pm shows stable QTc at ~460 ms  The patient is doing well today.  At this time, the patient denies chest pain, shortness of breath, or any new concerns.  Inpatient Medications    Scheduled Meds: . apixaban  5 mg Oral BID  . dofetilide  500 mcg Oral BID  . metoprolol succinate  50 mg Oral BID  . sacubitril-valsartan  1 tablet Oral BID  . sodium chloride flush  3 mL Intravenous Q12H  . spironolactone  25 mg Oral Daily   Continuous Infusions: . sodium chloride    . magnesium sulfate bolus IVPB     PRN Meds: sodium chloride, sodium chloride flush   Vital Signs    Vitals:   06/03/20 0439 06/03/20 1343 06/03/20 2018 06/04/20 0547  BP: 118/80 116/75 114/75 108/62  Pulse: (!) 58 60 (!) 57 67  Resp: 17 20 20 16   Temp: 98 F (36.7 C) 97.6 F (36.4 C) 97.6 F (36.4 C) 97.7 F (36.5 C)  TempSrc: Oral Oral Oral Oral  SpO2: 95% 95% 100% 100%  Weight: 118.9 kg   118.1 kg  Height:        Intake/Output Summary (Last 24 hours) at 06/04/2020 0806 Last data filed at 06/04/2020 0715 Gross per 24 hour  Intake 1080 ml  Output 2425 ml  Net -1345 ml   Filed Weights   06/02/20 1407 06/03/20 0439 06/04/20 0547  Weight: 120.2 kg 118.9 kg 118.1 kg    Physical Exam    GEN- The patient is well appearing, alert and oriented x 3 today.   Head- normocephalic, atraumatic Eyes-  Sclera clear, conjunctiva pink Ears- hearing intact Oropharynx- clear Neck- supple Lungs- Clear to ausculation bilaterally, normal work of breathing Heart- Regular rate and rhythm, no murmurs, rubs or gallops GI- soft, NT, ND, + BS Extremities- no clubbing, cyanosis, or edema Skin- no rash or lesion Psych- euthymic mood, full affect Neuro-  strength and sensation are intact  Labs    CBC No results for input(s): WBC, NEUTROABS, HGB, HCT, MCV, PLT in the last 72 hours. Basic Metabolic Panel Recent Labs    06/03/20 0302 06/04/20 0437  NA 138 137  K 4.4 4.0  CL 103 104  CO2 26 24  GLUCOSE 105* 101*  BUN 15 15  CREATININE 0.90 0.85  CALCIUM 9.4 9.4  MG 2.1 1.8    Potassium  Date/Time Value Ref Range Status  06/04/2020 04:37 AM 4.0 3.5 - 5.1 mmol/L Final   Magnesium  Date/Time Value Ref Range Status  06/04/2020 04:37 AM 1.8 1.7 - 2.4 mg/dL Final    Comment:    Performed at [facility], [place] [address], [place], [place] [address]    Telemetry    NSR/Sinus brady 50-60s (personally reviewed)  Radiology    No results found.   Patient Profile     Douglas Chandler is a 64 y.o. male with a past medical history significant for persistent atrial fibrillation.  They were admitted for tikosyn load.   Assessment & Plan    1. Persistent atrial fibrillation (history of Atrial FLUTTER ablation) Pt remains in NSR on Tikosyn 500 mcg BID  Continue Eliquis K stable. Mg 1.8. Supp ordered.  CHA2DS2VASC is at least 2.  Could eventually consider AF ablation. Will need repeat echo once rhythm controlled as thought to be reduced in setting of new AF.   2. HTN Stable  3. Chronic systolic CHF EF 37-09% by TEE 5/10. This was noted to be newly reduced in 02/2020 in setting of AF.  Continue toprol and Entresto and spiro.   Pt will not require DCCV. Plan home tomorrow if QTc remains stable.    For questions or updates, please contact [place] Please consult [email or web address] for contact info under Cardiology/STEMI.  Signed, Shirley Friar, PA-C  06/04/2020, 8:06 AM    I have seen, examined the patient, and reviewed the above assessment and plan.  Changes to above are made where necessary.  On exam, RRR.  Remains in sinus.  Qt is stable.  No changes at this time.  Co Sign: [name], MD

## 2020-06-04 NOTE — Progress Notes (Signed)
Pharmacy: Dofetilide (Tikosyn) - Follow Up Assessment and Electrolyte Replacement  Pharmacy consulted to assist in monitoring and replacing electrolytes in this 64 y.o. male admitted on 06/02/2020 undergoing dofetilide initiation. First dofetilide dose: 06/03/2020 at 2053  Labs:    Component Value Date/Time   K 4.0 06/04/2020 0437   MG 1.8 06/04/2020 0437     Plan: Potassium: K >/= 4: No additional supplementation needed  Magnesium: Mg 1.8-2: Give Mg 2 gm IV x1    Thank you for allowing pharmacy to participate in this patient's care   Cristela Felt, PharmD PGY1 Pharmacy Resident [employer]: [phone]  06/04/2020  7:21 AM

## 2020-06-04 NOTE — Progress Notes (Signed)
Morning EKG reviewed  Shows remains in NSR at 60 bpm with stable QTc at ~455-460 ms when measured manually.  Continue Tikosyn 500 mcg BID.   Plan for home tomorrow if QTc remains stable.    Shirley Friar, [place]  Pager: [phone]  06/04/2020 12:35 PM

## 2020-06-04 NOTE — Care Management (Signed)
06-04-20 1630 Case Manager spoke with patient regarding dofetilide cost. Patient wants to use [facility]. Case Manager called [facility] has 5 bottles dofetilide. Please e-scribe Rx for 30 day no refills to [facility] and the Rx for refills to [facility]. No further needs from Case Manager at this time. Bethena Roys, RN,BSN Case Manager

## 2020-06-05 LAB — MAGNESIUM: Magnesium: 1.8 mg/dL (ref 1.7–2.4)

## 2020-06-05 LAB — BASIC METABOLIC PANEL
Anion gap: 9 (ref 5–15)
BUN: 15 mg/dL (ref 8–23)
CO2: 27 mmol/L (ref 22–32)
Calcium: 9.7 mg/dL (ref 8.9–10.3)
Chloride: 102 mmol/L (ref 98–111)
Creatinine, Ser: 0.96 mg/dL (ref 0.61–1.24)
GFR calc Af Amer: >60 mL/min (ref >60)
GFR calc non Af Amer: >60 mL/min (ref >60)
Glucose, Bld: 105 mg/dL — ABNORMAL HIGH (ref 70–99)
Potassium: 3.9 mmol/L (ref 3.5–5.1)
Sodium: 138 mmol/L (ref 135–145)

## 2020-06-05 MED ORDER — MAGNESIUM SULFATE 4 GM/100ML IV SOLN
4.0000 g | Freq: Once | INTRAVENOUS | Status: AC
  Administered 2020-06-05: 4 g via INTRAVENOUS
  Filled 2020-06-05: qty 100

## 2020-06-05 MED ORDER — MAGNESIUM OXIDE 400 (241.3 MG) MG PO TABS: 400.0000 mg | ORAL_TABLET | Freq: Every day | ORAL | 0 refills | Status: DC

## 2020-06-05 MED ORDER — POTASSIUM CHLORIDE CRYS ER 20 MEQ PO TBCR
40.0000 meq | EXTENDED_RELEASE_TABLET | Freq: Once | ORAL | Status: AC
  Administered 2020-06-05: 40 meq via ORAL
  Filled 2020-06-05: qty 2

## 2020-06-05 MED ORDER — DOFETILIDE 500 MCG PO CAPS: 500.0000 ug | ORAL_CAPSULE | Freq: Two times a day (BID) | ORAL | 0 refills | Status: DC

## 2020-06-05 NOTE — Progress Notes (Signed)
Doing well this am.  Remains in sinus  Qt is stable.  I have spoken with EP APP by phone and and also the patient.  We will plan discharge to home today if qt on ekg today remains stable with close follow-up in the AF clinic  [name] MD, [place] 06/05/2020 8:43 AM

## 2020-06-05 NOTE — Progress Notes (Addendum)
Pharmacy: Dofetilide (Tikosyn) - Follow Up Assessment and Electrolyte Replacement  Pharmacy consulted to assist in monitoring and replacing electrolytes in this 64 y.o. male admitted on 06/02/2020 undergoing dofetilide initiation. First dofetilide dose: 06/03/2020 at 2053  Labs:    Component Value Date/Time   K 4.0 06/04/2020 0437   MG 1.8 06/04/2020 0437     Plan: Potassium: K 3.8-3.9:  Give KCl 40 mEq po x1   Magnesium: Mg 1.8-2: Give Mg 4g IV x1 since did not increase with Mg 2g IV x1 yesterday. Also starting 400mg  mag ox daily.   As patient has only required potassium chloride today and has required magnesium supplementation three times during hospitalization. Would recommend discharging on magnesium oxide 400mg  daily on discharge.   Thank you for allowing pharmacy to participate in this patient's care   Cristela Felt, PharmD PGY1 Pharmacy Resident [employer]: [phone]  06/05/2020  6:56 AM

## 2020-06-05 NOTE — Plan of Care (Signed)
  Problem: Education: Goal: Knowledge of General Education information will improve Description: Including pain rating scale, medication(s)/side effects and non-pharmacologic comfort measures Outcome: Adequate for Discharge   Problem: Health Behavior/Discharge Planning: Goal: Ability to manage health-related needs will improve Outcome: Adequate for Discharge   Problem: Clinical Measurements: Goal: Ability to maintain clinical measurements within normal limits will improve Outcome: Adequate for Discharge   Problem: Clinical Measurements: Goal: Will remain free from infection Outcome: Adequate for Discharge   Problem: Clinical Measurements: Goal: Diagnostic test results will improve Outcome: Adequate for Discharge   Problem: Clinical Measurements: Goal: Respiratory complications will improve Outcome: Adequate for Discharge   Problem: Clinical Measurements: Goal: Cardiovascular complication will be avoided Outcome: Adequate for Discharge   Problem: Activity: Goal: Risk for activity intolerance will decrease Outcome: Adequate for Discharge   Problem: Nutrition: Goal: Adequate nutrition will be maintained Outcome: Adequate for Discharge   Problem: Coping: Goal: Level of anxiety will decrease Outcome: Adequate for Discharge   Problem: Elimination: Goal: Will not experience complications related to bowel motility Outcome: Adequate for Discharge   Problem: Elimination: Goal: Will not experience complications related to urinary retention Outcome: Adequate for Discharge   Problem: Pain Managment: Goal: General experience of comfort will improve Outcome: Adequate for Discharge   Problem: Safety: Goal: Ability to remain free from injury will improve Outcome: Adequate for Discharge   Problem: Skin Integrity: Goal: Risk for impaired skin integrity will decrease Outcome: Adequate for Discharge

## 2020-06-05 NOTE — Progress Notes (Addendum)
Evening EKG reviewed  Shows remains in NSR/sinus brady at 53 bpm with stable QTc at ~460 ms when measured manually  Continue Tikosyn 500 mcg BID.   Labs pending this am.  ADDENDUM: K 3.9 Mg 1.8 Supp ordered for both.   Home this afternoon if QTc remains stable after morning dose.    Shirley Friar, PA-C  Pager: [phone]  06/05/2020 7:03 AM

## 2020-06-05 NOTE — Progress Notes (Signed)
NURSING PROGRESS NOTE  Douglas Chandler [id number] Discharge Data: 06/05/2020 1:52 PM Attending Provider: [name], MD GMW:[id number], Douglas Rumps, NP     Douglas Chandler to be D/C'd Home per MD order.  Discussed with the patient the After Visit Summary and all questions fully answered. All IV's discontinued with no bleeding noted. All belongings returned to patient for patient to take home.Pt taken downstairs via wheelchair accompanied by a staff member.   Last Vital Signs:  Blood pressure 105/64, pulse (!) 57, temperature 97.7 F (36.5 C), temperature source Oral, resp. rate 16, height 6\' 3"  (1.905 m), weight 118.2 kg, SpO2 97 %.  Discharge Medication List Allergies as of 06/05/2020      Reactions   Meloxicam Rash      Medication List    TAKE these medications   apixaban 5 MG Tabs tablet Commonly known as: Eliquis Take 1 tablet (5 mg total) by mouth 2 (two) times daily.   dofetilide 500 MCG capsule Commonly known as: TIKOSYN Take 1 capsule (500 mcg total) by mouth 2 (two) times daily.   doxycycline 50 MG capsule Commonly known as: VIBRAMYCIN Take 50 mg by mouth 2 (two) times daily as needed (rosacea).   Entresto 24-26 MG Generic drug: sacubitril-valsartan Take 1 tablet by mouth 2 (two) times daily.   fluorometholone 0.1 % ophthalmic suspension Commonly known as: FML Place 1 drop into both eyes 2 (two) times daily as needed (redness/irritation).   fluticasone 50 MCG/ACT nasal spray Commonly known as: FLONASE Place 2 sprays into both nostrils daily as needed for allergies.   loratadine 10 MG tablet Commonly known as: CLARITIN Take 10 mg by mouth as needed for allergies or rhinitis.   magnesium oxide 400 (241.3 Mg) MG tablet Commonly known as: MAG-OX Take 1 tablet (400 mg total) by mouth daily. Start taking on: June 06, 2020   meclizine 25 MG tablet Commonly known as: ANTIVERT Take 1 tablet (25 mg total) by mouth 3 (three) times daily as needed for dizziness.    metoprolol succinate 50 MG 24 hr tablet Commonly known as: TOPROL-XL Take 1 tablet in the AM and 1 tablet in the PM What changed:   how much to take  how to take this  when to take this  additional instructions   multivitamin with minerals Tabs tablet Take 1 tablet by mouth daily. Multivitamin for Adults 50+   spironolactone 25 MG tablet Commonly known as: ALDACTONE Take 1 tablet (25 mg total) by mouth daily.

## 2020-06-05 NOTE — Discharge Summary (Signed)
ELECTROPHYSIOLOGY PROCEDURE DISCHARGE SUMMARY    Patient ID: Douglas Chandler,  MRN: [id number], DOB/AGE: 1956-01-09 64 y.o.  Admit date: 06/02/2020 Discharge date: 06/05/2020  Primary Care Physician: [name], NP  Primary Cardiologist: No primary care provider on file.  Electrophysiologist: [name], MD   Primary Discharge Diagnosis:  1.  Persistent atrial fibrillation status post Tikosyn loading this admission  Secondary Discharge Diagnosis:  2. HTN 3. Chronic systolic CHF  Allergies  Allergen Reactions  . Meloxicam Rash     Procedures This Admission:  1.  Tikosyn loading   Brief HPI: Douglas Chandler is a 64 y.o. male with a past medical history as noted above.  They were referred to EP in the outpatient setting for treatment options of atrial fibrillation.  Risks, benefits, and alternatives to Tikosyn were reviewed with the patient who wished to proceed.    Hospital Course:  The patient was admitted and Tikosyn was initiated.  Renal function and electrolytes were followed during the hospitalization.  Their QTc remained stable. Pt presented in NSR and remains so, and did not require cardioversion. They were monitored until discharge on telemetry which demonstrated sinus bradycardia/NSR 50-60s.  On the day of discharge, they were examined by Dr. Rayann Heman  who considered them stable for discharge to home.  Follow-up has been arranged with the Atrial Fibrillation clinic in approximately 1 week and with Dr. Babs Sciara his APP in 4 weeks.   Physical Exam: Vitals:   06/04/20 1444 06/04/20 2007 06/04/20 2011 06/05/20 0512  BP: 119/71 101/67  105/64  Pulse: (!) 56 60 (!) 58 (!) 57  Resp:  17  16  Temp: 98 F (36.7 C) (!) 97.4 F (36.3 C)  97.7 F (36.5 C)  TempSrc: Oral Oral  Oral  SpO2: 96%  100% 97%  Weight:    118.2 kg  Height:        GEN- The patient is well appearing, alert and oriented x 3 today.   HEENT: normocephalic, atraumatic; sclera clear, conjunctiva  pink; hearing intact; oropharynx clear; neck supple, no JVP Lymph- no cervical lymphadenopathy Lungs- Clear to ausculation bilaterally, normal work of breathing.  No wheezes, rales, rhonchi Heart- Regular rate and rhythm, no murmurs, rubs or gallops, PMI not laterally displaced GI- soft, non-tender, non-distended, bowel sounds present, no hepatosplenomegaly Extremities- no clubbing, cyanosis, or edema; DP/PT/radial pulses 2+ bilaterally MS- no significant deformity or atrophy Skin- warm and dry, no rash or lesion Psych- euthymic mood, full affect Neuro- strength and sensation are intact   Labs:   Lab Results  Component Value Date   WBC 8.5 05/20/2020   HGB 15.8 05/20/2020   HCT 45.8 05/20/2020   MCV 89.1 05/20/2020   PLT 177 05/20/2020    Recent Labs  Lab 06/05/20 0703  NA 138  K 3.9  CL 102  CO2 27  BUN 15  CREATININE 0.96  CALCIUM 9.7  GLUCOSE 105*     Discharge Medications:  Allergies as of 06/05/2020      Reactions   Meloxicam Rash      Medication List    TAKE these medications   apixaban 5 MG Tabs tablet Commonly known as: Eliquis Take 1 tablet (5 mg total) by mouth 2 (two) times daily.   dofetilide 500 MCG capsule Commonly known as: TIKOSYN Take 1 capsule (500 mcg total) by mouth 2 (two) times daily.   doxycycline 50 MG capsule Commonly known as: VIBRAMYCIN Take 50 mg by mouth 2 (two) times  daily as needed (rosacea).   Entresto 24-26 MG Generic drug: sacubitril-valsartan Take 1 tablet by mouth 2 (two) times daily.   fluorometholone 0.1 % ophthalmic suspension Commonly known as: FML Place 1 drop into both eyes 2 (two) times daily as needed (redness/irritation).   fluticasone 50 MCG/ACT nasal spray Commonly known as: FLONASE Place 2 sprays into both nostrils daily as needed for allergies.   loratadine 10 MG tablet Commonly known as: CLARITIN Take 10 mg by mouth as needed for allergies or rhinitis.   magnesium oxide 400 (241.3 Mg) MG  tablet Commonly known as: MAG-OX Take 1 tablet (400 mg total) by mouth daily. Start taking on: June 06, 2020   meclizine 25 MG tablet Commonly known as: ANTIVERT Take 1 tablet (25 mg total) by mouth 3 (three) times daily as needed for dizziness.   metoprolol succinate 50 MG 24 hr tablet Commonly known as: TOPROL-XL Take 1 tablet in the AM and 1 tablet in the PM What changed:   how much to take  how to take this  when to take this  additional instructions   multivitamin with minerals Tabs tablet Take 1 tablet by mouth daily. Multivitamin for Adults 50+   spironolactone 25 MG tablet Commonly known as: ALDACTONE Take 1 tablet (25 mg total) by mouth daily.       Disposition:    Follow-up Information    [facility] Follow up on 06/12/2020.   Specialty: Cardiology Why: at 0900 for post hospital tikosyn follow up. Code for July is 4009 Contact information: [address] [id number] [place] [phone]       Shirley Friar, PA-C Follow up on 07/17/2020.   Specialty: Physician Assistant Why: at 1055 for 1 month post tikosyn follow up Contact information: [place] [address] [phone]               Duration of Discharge Encounter: Greater than 30 minutes including physician time.  Jacalyn Lefevre, PA-C  06/05/2020 11:20 AM

## 2020-06-10 ENCOUNTER — Ambulatory Visit (HOSPITAL_COMMUNITY): Payer: BC Managed Care – PPO | Attending: Cardiovascular Disease

## 2020-06-10 ENCOUNTER — Other Ambulatory Visit: Payer: Self-pay

## 2020-06-10 VITALS — BP 120/71

## 2020-06-10 DIAGNOSIS — I493 Ventricular premature depolarization: Secondary | ICD-10-CM | POA: Insufficient documentation

## 2020-06-10 DIAGNOSIS — I5022 Chronic systolic (congestive) heart failure: Secondary | ICD-10-CM | POA: Insufficient documentation

## 2020-06-10 DIAGNOSIS — R002 Palpitations: Secondary | ICD-10-CM | POA: Diagnosis not present

## 2020-06-10 DIAGNOSIS — I10 Essential (primary) hypertension: Secondary | ICD-10-CM

## 2020-06-10 DIAGNOSIS — I4891 Unspecified atrial fibrillation: Secondary | ICD-10-CM | POA: Insufficient documentation

## 2020-06-10 LAB — ECHOCARDIOGRAM COMPLETE

## 2020-06-10 MED ORDER — PERFLUTREN LIPID MICROSPHERE
1.0000 mL | INTRAVENOUS | Status: AC | PRN
  Administered 2020-06-10: 1 mL via INTRAVENOUS

## 2020-06-11 ENCOUNTER — Telehealth: Payer: Self-pay

## 2020-06-11 NOTE — Telephone Encounter (Signed)
-----   Message from Shirley Friar, PA-C sent at 06/11/2020  8:32 AM EDT ----- Please the patient know his heart function has normalized in the setting of normal rhythm. This is great news.   Legrand Como [address] [place], PA-C  06/11/2020 8:32 AM

## 2020-06-11 NOTE — Telephone Encounter (Signed)
Pt is aware and agreeable to lab results and confirmed upcoming appt
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
## 2020-06-12 ENCOUNTER — Other Ambulatory Visit (HOSPITAL_COMMUNITY): Payer: Self-pay | Admitting: *Deleted

## 2020-06-12 ENCOUNTER — Ambulatory Visit (HOSPITAL_COMMUNITY)
Admit: 2020-06-12 | Discharge: 2020-06-12 | Disposition: A | Discharge status: Home / Self Care | Payer: BC Managed Care – PPO | Attending: Physician Assistant | Admitting: Physician Assistant

## 2020-06-12 ENCOUNTER — Encounter (HOSPITAL_COMMUNITY): Payer: Self-pay | Admitting: Physician Assistant

## 2020-06-12 ENCOUNTER — Other Ambulatory Visit: Payer: Self-pay

## 2020-06-12 VITALS — BP 118/70 | HR 59 | Ht 75.0 in | Wt 265.6 lb

## 2020-06-12 DIAGNOSIS — Z79899 Other long term (current) drug therapy: Secondary | ICD-10-CM | POA: Diagnosis not present

## 2020-06-12 DIAGNOSIS — I483 Typical atrial flutter: Secondary | ICD-10-CM | POA: Diagnosis not present

## 2020-06-12 DIAGNOSIS — Z8249 Family history of ischemic heart disease and other diseases of the circulatory system: Secondary | ICD-10-CM | POA: Diagnosis not present

## 2020-06-12 DIAGNOSIS — D6869 Other thrombophilia: Secondary | ICD-10-CM | POA: Insufficient documentation

## 2020-06-12 DIAGNOSIS — J302 Other seasonal allergic rhinitis: Secondary | ICD-10-CM | POA: Insufficient documentation

## 2020-06-12 DIAGNOSIS — E669 Obesity, unspecified: Secondary | ICD-10-CM | POA: Diagnosis not present

## 2020-06-12 DIAGNOSIS — I4891 Unspecified atrial fibrillation: Secondary | ICD-10-CM | POA: Diagnosis present

## 2020-06-12 DIAGNOSIS — J984 Other disorders of lung: Secondary | ICD-10-CM | POA: Insufficient documentation

## 2020-06-12 DIAGNOSIS — I4819 Other persistent atrial fibrillation: Secondary | ICD-10-CM

## 2020-06-12 DIAGNOSIS — Z7901 Long term (current) use of anticoagulants: Secondary | ICD-10-CM | POA: Diagnosis not present

## 2020-06-12 DIAGNOSIS — G43909 Migraine, unspecified, not intractable, without status migrainosus: Secondary | ICD-10-CM | POA: Insufficient documentation

## 2020-06-12 DIAGNOSIS — L719 Rosacea, unspecified: Secondary | ICD-10-CM | POA: Diagnosis not present

## 2020-06-12 DIAGNOSIS — J309 Allergic rhinitis, unspecified: Secondary | ICD-10-CM | POA: Insufficient documentation

## 2020-06-12 DIAGNOSIS — G4730 Sleep apnea, unspecified: Secondary | ICD-10-CM | POA: Diagnosis not present

## 2020-06-12 DIAGNOSIS — Z9989 Dependence on other enabling machines and devices: Secondary | ICD-10-CM | POA: Diagnosis not present

## 2020-06-12 DIAGNOSIS — I10 Essential (primary) hypertension: Secondary | ICD-10-CM | POA: Insufficient documentation

## 2020-06-12 LAB — BASIC METABOLIC PANEL
Anion gap: 9 (ref 5–15)
BUN: 15 mg/dL (ref 8–23)
CO2: 24 mmol/L (ref 22–32)
Calcium: 9.4 mg/dL (ref 8.9–10.3)
Chloride: 104 mmol/L (ref 98–111)
Creatinine, Ser: 0.9 mg/dL (ref 0.61–1.24)
GFR calc Af Amer: >60 mL/min (ref >60)
GFR calc non Af Amer: >60 mL/min (ref >60)
Glucose, Bld: 128 mg/dL — ABNORMAL HIGH (ref 70–99)
Potassium: 4.1 mmol/L (ref 3.5–5.1)
Sodium: 137 mmol/L (ref 135–145)

## 2020-06-12 LAB — MAGNESIUM: Magnesium: 1.8 mg/dL (ref 1.7–2.4)

## 2020-06-12 MED ORDER — MAGNESIUM OXIDE 400 (241.3 MG) MG PO TABS: 400.0000 mg | ORAL_TABLET | Freq: Every day | ORAL | 6 refills | Status: DC

## 2020-06-12 MED ORDER — DOFETILIDE 500 MCG PO CAPS: 500.0000 ug | ORAL_CAPSULE | Freq: Two times a day (BID) | ORAL | 3 refills | Status: DC

## 2020-06-22 ENCOUNTER — Telehealth: Payer: Self-pay

## 2020-06-22 NOTE — Telephone Encounter (Signed)
LMTCB to see if patient would be willing to see Tommye Standard, PA-C at first available in lue of 07/17/20 appt with Oda Kilts, PA-C. If patient is willing te reschedule appt and see renee 07/17/20 appt can be cancelled.

## 2020-06-30 ENCOUNTER — Encounter: Payer: Self-pay | Admitting: Gastroenterology

## 2020-06-30 ENCOUNTER — Telehealth: Payer: Self-pay

## 2020-06-30 ENCOUNTER — Telehealth: Payer: Self-pay | Admitting: Internal Medicine

## 2020-06-30 ENCOUNTER — Ambulatory Visit (INDEPENDENT_AMBULATORY_CARE_PROVIDER_SITE_OTHER): Payer: BC Managed Care – PPO | Admitting: Gastroenterology

## 2020-06-30 VITALS — BP 110/62 | HR 56 | Ht 75.0 in | Wt 267.6 lb

## 2020-06-30 DIAGNOSIS — Z86010 Personal history of colonic polyps: Secondary | ICD-10-CM

## 2020-06-30 DIAGNOSIS — Z7901 Long term (current) use of anticoagulants: Secondary | ICD-10-CM | POA: Diagnosis not present

## 2020-06-30 DIAGNOSIS — Z860101 Personal history of adenomatous and serrated colon polyps: Secondary | ICD-10-CM

## 2020-06-30 MED ORDER — SUTAB 1479-225-188 MG PO TABS
1.0000 | ORAL_TABLET | ORAL | 0 refills | Status: DC
Start: 2020-06-30 — End: 2020-09-09

## 2020-06-30 NOTE — Telephone Encounter (Signed)
    Pt is calling to get refill request for magnesium oxide (MAG-OX) 400 (241.3 Mg) MG tablet . advised it was prescribed by Afib clinic. Transferred call to Afib clinic

## 2020-06-30 NOTE — Telephone Encounter (Signed)
   Primary Cardiologist: Dr Rayann Heman  Chart reviewed as part of pre-operative protocol coverage. Given past medical history and time since last visit, based on ACC/AHA guidelines, Douglas Chandler would be at acceptable risk for the planned procedure without further cardiovascular testing.   OK to hold Eliquis 2 days pre op, resume as soon as possible post op.  I will route this recommendation to the requesting party via Epic fax function and remove from pre-op pool.  Please call with questions.  Kerin Ransom, PA-C 06/30/2020, 1:38 PM
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
## 2020-06-30 NOTE — Patient Instructions (Addendum)
You have been scheduled for a colonoscopy. Please follow written instructions given to you at your visit today.  Please pick up your prep supplies at the pharmacy within the next 1-3 days. If you use inhalers (even only as needed), please bring them with you on the day of your procedure.  Thank you for choosing me and [facility].  Malcolm T. Stark, Jr., MD., FACG  

## 2020-06-30 NOTE — Telephone Encounter (Signed)
Patient with diagnosis of afib on Eliquis for anticoagulation.    Procedure: colonoscopy Date of procedure: 09/08/20  CHADS2-VASc score of 2 (CHF, HTN). Pt started on Tikosyn 6/22-6/25/21. Now outside of 1 month window of uninterrupted anticoag after.  CrCl >114mL/min Platelet count 177K  Per office protocol, patient can hold Eliquis for 2 days prior to procedure as requested.

## 2020-06-30 NOTE — Telephone Encounter (Signed)
Left message for patient to return my call.

## 2020-06-30 NOTE — Telephone Encounter (Signed)
[facility] Pre-operative Risk Assessment     Request for surgical clearance:     Endoscopy Procedure  What type of surgery is being performed?     Colonoscopy  When is this surgery scheduled?     09/08/20  What type of clearance is required ?   Pharmacy  Are there any medications that need to be held prior to surgery and how long? Eliquis x 2 days  Practice name and name of physician performing surgery?      [facility]  What is your office phone and fax number?      Phone- [phone]  [phone]  Anesthesia type (None, local, MAC, general) ?       MAC

## 2020-07-01 NOTE — Telephone Encounter (Signed)
Informed patient to hold Eliquis x 2 days prior to his procedure per Cardiology. Patient agreed and verbalized understanding.

## 2020-07-17 ENCOUNTER — Ambulatory Visit: Payer: BC Managed Care – PPO | Admitting: Student
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
## 2020-07-21 ENCOUNTER — Ambulatory Visit (INDEPENDENT_AMBULATORY_CARE_PROVIDER_SITE_OTHER): Payer: BC Managed Care – PPO | Admitting: Physician Assistant

## 2020-07-21 ENCOUNTER — Other Ambulatory Visit: Payer: Self-pay

## 2020-07-21 VITALS — BP 118/68 | HR 66 | Ht 75.0 in | Wt 262.0 lb

## 2020-07-21 DIAGNOSIS — I4819 Other persistent atrial fibrillation: Secondary | ICD-10-CM

## 2020-07-21 DIAGNOSIS — Z79899 Other long term (current) drug therapy: Secondary | ICD-10-CM

## 2020-07-21 DIAGNOSIS — I428 Other cardiomyopathies: Secondary | ICD-10-CM | POA: Diagnosis not present

## 2020-07-21 DIAGNOSIS — Z5181 Encounter for therapeutic drug level monitoring: Secondary | ICD-10-CM

## 2020-07-21 DIAGNOSIS — I10 Essential (primary) hypertension: Secondary | ICD-10-CM

## 2020-07-21 NOTE — Patient Instructions (Signed)
Medication Instructions:   Your physician recommends that you continue on your current medications as directed. Please refer to the Current Medication list given to you today.   *If you need a refill on your cardiac medications before your next appointment, please call your pharmacy*   Lab Work:  [facility] AND [place]   If you have labs (blood work) drawn today and your tests are completely normal, you will receive your results only by: Marland Kitchen MyChart Message (if you have MyChart) OR . A paper copy in the mail If you have any lab test that is abnormal or we need to change your treatment, we will call you to review the results.   Testing/Procedures: NONE ORDERED  TODAY    Follow-Up: At [facility], you and your health needs are our priority.  As part of our continuing mission to provide you with exceptional heart care, we have created designated Provider Care Teams.  These Care Teams include your primary Cardiologist (physician) and Advanced Practice Providers (APPs -  Physician Assistants and Nurse Practitioners) who all work together to provide you with the care you need, when you need it.  We recommend signing up for the patient portal called "MyChart".  Sign up information is provided on this After Visit Summary.  MyChart is used to connect with patients for Virtual Visits (Telemedicine).  Patients are able to view lab/test results, encounter notes, upcoming appointments, etc.  Non-urgent messages can be sent to your provider as well.   To learn more about what you can do with MyChart, go to [email or web address].    Your next appointment:   6 month(s)  The format for your next appointment:   In Person  Provider:   You may see [name], MD or one of the following Advanced Practice Providers on your designated Care Team:    Chanetta Marshall, NP  Tommye Standard, PA-C  Legrand Como "Oda Kilts, [place]    Other Instructions

## 2020-07-22 ENCOUNTER — Telehealth: Payer: Self-pay | Admitting: *Deleted

## 2020-07-22 DIAGNOSIS — Z79899 Other long term (current) drug therapy: Secondary | ICD-10-CM

## 2020-07-22 LAB — BASIC METABOLIC PANEL
BUN/Creatinine Ratio: 14 (ref 10–24)
BUN: 13 mg/dL (ref 8–27)
CO2: 23 mmol/L (ref 20–29)
Calcium: 9.8 mg/dL (ref 8.6–10.2)
Chloride: 104 mmol/L (ref 96–106)
Creatinine, Ser: 0.9 mg/dL (ref 0.76–1.27)
GFR calc Af Amer: 106 mL/min/{1.73_m2} (ref >59)
GFR calc non Af Amer: 92 mL/min/{1.73_m2} (ref >59)
Glucose: 143 mg/dL — ABNORMAL HIGH (ref 65–99)
Potassium: 4.2 mmol/L (ref 3.5–5.2)
Sodium: 140 mmol/L (ref 134–144)

## 2020-07-22 LAB — MAGNESIUM: Magnesium: 1.8 mg/dL (ref 1.6–2.3)

## 2020-07-22 MED ORDER — MAGNESIUM OXIDE 400 (241.3 MG) MG PO TABS: 400.0000 mg | ORAL_TABLET | Freq: Two times a day (BID) | ORAL | 6 refills | Status: DC

## 2020-07-22 NOTE — Telephone Encounter (Signed)
Lvm of results and  recommendations to call back to discuss.  Patient returned call. Spoke with patient and aware of magnesium 400 mg twice a day and repeat lab 08-05-20

## 2020-07-22 NOTE — Telephone Encounter (Signed)
Patient called back returning [facility] Call

## 2020-08-05 ENCOUNTER — Other Ambulatory Visit: Payer: Self-pay

## 2020-08-05 ENCOUNTER — Other Ambulatory Visit: Payer: BC Managed Care – PPO | Admitting: *Deleted

## 2020-08-05 DIAGNOSIS — Z79899 Other long term (current) drug therapy: Secondary | ICD-10-CM

## 2020-08-05 LAB — MAGNESIUM: Magnesium: 1.8 mg/dL (ref 1.6–2.3)

## 2020-08-07 ENCOUNTER — Other Ambulatory Visit (HOSPITAL_COMMUNITY): Payer: Self-pay | Admitting: Nurse Practitioner

## 2020-08-10 ENCOUNTER — Other Ambulatory Visit (HOSPITAL_COMMUNITY): Payer: Self-pay | Admitting: *Deleted

## 2020-08-10 MED ORDER — MAGNESIUM OXIDE 400 (241.3 MG) MG PO TABS
400.0000 mg | ORAL_TABLET | Freq: Two times a day (BID) | ORAL | 6 refills | Status: DC
Start: 2020-08-10 — End: 2021-03-03

## 2020-08-27 ENCOUNTER — Other Ambulatory Visit: Payer: Self-pay | Admitting: *Deleted

## 2020-08-27 DIAGNOSIS — Z79899 Other long term (current) drug therapy: Secondary | ICD-10-CM

## 2020-08-31 ENCOUNTER — Other Ambulatory Visit: Payer: BC Managed Care – PPO

## 2020-08-31 ENCOUNTER — Other Ambulatory Visit (HOSPITAL_COMMUNITY): Payer: Self-pay

## 2020-08-31 MED ORDER — DOFETILIDE 500 MCG PO CAPS
500.0000 ug | ORAL_CAPSULE | Freq: Two times a day (BID) | ORAL | 3 refills | Status: DC
Start: 2020-08-31 — End: 2020-11-24

## 2020-09-01 ENCOUNTER — Other Ambulatory Visit: Payer: Self-pay

## 2020-09-01 ENCOUNTER — Other Ambulatory Visit: Payer: BC Managed Care – PPO

## 2020-09-01 DIAGNOSIS — Z79899 Other long term (current) drug therapy: Secondary | ICD-10-CM

## 2020-09-01 LAB — MAGNESIUM: Magnesium: 2 mg/dL (ref 1.6–2.3)

## 2020-09-02 ENCOUNTER — Encounter: Payer: Self-pay | Admitting: Gastroenterology

## 2020-09-08 ENCOUNTER — Encounter: Payer: Self-pay | Admitting: Certified Registered Nurse Anesthetist

## 2020-09-09 ENCOUNTER — Encounter: Payer: Self-pay | Admitting: Gastroenterology

## 2020-09-09 ENCOUNTER — Ambulatory Visit (AMBULATORY_SURGERY_CENTER): Payer: BC Managed Care – PPO | Admitting: Gastroenterology

## 2020-09-09 ENCOUNTER — Other Ambulatory Visit: Payer: Self-pay

## 2020-09-09 VITALS — BP 99/63 | HR 54 | Temp 98.2°F | Resp 19 | Ht 75.0 in | Wt 267.0 lb

## 2020-09-09 DIAGNOSIS — Z86010 Personal history of colonic polyps: Secondary | ICD-10-CM

## 2020-09-09 MED ORDER — SODIUM CHLORIDE 0.9 % IV SOLN: 500.0000 mL | Freq: Once | INTRAVENOUS | Status: DC

## 2020-09-09 NOTE — Patient Instructions (Signed)
Please read handouts provided. Continue present medications. Resume Eliquis ( apixaban ) tomorrow at prior dose.      YOU HAD AN ENDOSCOPIC PROCEDURE TODAY AT [facility]:   Refer to the procedure report that was given to you for any specific questions about what was found during the examination.  If the procedure report does not answer your questions, please call your gastroenterologist to clarify.  If you requested that your care partner not be given the details of your procedure findings, then the procedure report has been included in a sealed envelope for you to review at your convenience later.  YOU SHOULD EXPECT: Some feelings of bloating in the abdomen. Passage of more gas than usual.  Walking can help get rid of the air that was put into your GI tract during the procedure and reduce the bloating. If you had a lower endoscopy (such as a colonoscopy or flexible sigmoidoscopy) you may notice spotting of blood in your stool or on the toilet paper. If you underwent a bowel prep for your procedure, you may not have a normal bowel movement for a few days.  Please Note:  You might notice some irritation and congestion in your nose or some drainage.  This is from the oxygen used during your procedure.  There is no need for concern and it should clear up in a day or so.  SYMPTOMS TO REPORT IMMEDIATELY:   Following lower endoscopy (colonoscopy or flexible sigmoidoscopy):  Excessive amounts of blood in the stool  Significant tenderness or worsening of abdominal pains  Swelling of the abdomen that is new, acute  Fever of 100F or higher    For urgent or emergent issues, a gastroenterologist can be reached at any hour by calling [phone]. Do not use MyChart messaging for urgent concerns.    DIET:  We do recommend a small meal at first, but then you may proceed to your regular diet.  Drink plenty of fluids but you should avoid alcoholic beverages for 24  hours.  ACTIVITY:  You should plan to take it easy for the rest of today and you should NOT DRIVE or use heavy machinery until tomorrow (because of the sedation medicines used during the test).    FOLLOW UP: Our staff will call the number listed on your records 48-72 hours following your procedure to check on you and address any questions or concerns that you may have regarding the information given to you following your procedure. If we do not reach you, we will leave a message.  We will attempt to reach you two times.  During this call, we will ask if you have developed any symptoms of COVID 19. If you develop any symptoms (ie: fever, flu-like symptoms, shortness of breath, cough etc.) before then, please call [phone].  If you test positive for Covid 19 in the 2 weeks post procedure, please call and report this information to [place].    If any biopsies were taken you will be contacted by phone or by letter within the next 1-3 weeks.  Please call us at [phone] if you have not heard about the biopsies in 3 weeks.    SIGNATURES/CONFIDENTIALITY: You and/or your care partner have signed paperwork which will be entered into your electronic medical record.  These signatures attest to the fact that that the information above on your After Visit Summary has been reviewed and is understood.  Full responsibility of the confidentiality of this discharge information lies with  you and/or your care-partner.

## 2020-09-09 NOTE — Op Note (Signed)
[place] Patient Name: Douglas Chandler Procedure Date: 09/09/2020 7:24 AM MRN: [id number] Endoscopist: Ladene Artist , MD Age: 64 Referring MD:  Date of Birth: [birth year] Gender: Male Account #: [id number] Procedure:                Colonoscopy Indications:              Surveillance: Personal history of adenomatous                            polyps on last colonoscopy 3 years ago Medicines:                Monitored Anesthesia Care Procedure:                Pre-Anesthesia Assessment:                           - Prior to the procedure, a History and Physical                            was performed, and patient medications and                            allergies were reviewed. The patient's tolerance of                            previous anesthesia was also reviewed. The risks                            and benefits of the procedure and the sedation                            options and risks were discussed with the patient.                            All questions were answered, and informed consent                            was obtained. Prior Anticoagulants: The patient has                            taken Eliquis (apixaban), last dose was 2 days                            prior to procedure. ASA Grade Assessment: II - A                            patient with mild systemic disease. After reviewing                            the risks and benefits, the patient was deemed in                            satisfactory condition to undergo the procedure.  After obtaining informed consent, the colonoscope                            was passed under direct vision. Throughout the                            procedure, the patient's blood pressure, pulse, and                            oxygen saturations were monitored continuously. The                            Colonoscope was introduced through the anus and                            advanced to the  the cecum, identified by                            appendiceal orifice and ileocecal valve. The                            ileocecal valve, appendiceal orifice, and rectum                            were photographed. The quality of the bowel                            preparation was good. The colonoscopy was performed                            without difficulty. The patient tolerated the                            procedure well. Scope In: 8:10:24 AM Scope Out: 8:28:24 AM Scope Withdrawal Time: 0 hours 11 minutes 17 seconds  Total Procedure Duration: 0 hours 18 minutes 0 seconds  Findings:                 The perianal and digital rectal examinations were                            normal.                           Internal hemorrhoids were found during                            retroflexion. The hemorrhoids were small and Grade                            I (internal hemorrhoids that do not prolapse).                           The exam was otherwise without abnormality on  direct and retroflexion views. Complications:            No immediate complications. Estimated blood loss:                            None. Estimated Blood Loss:     Estimated blood loss: none. Impression:               - Internal hemorrhoids.                           - The examination was otherwise normal on direct                            and retroflexion views.                           - No specimens collected. Recommendation:           - Repeat colonoscopy in 5 years for surveillance.                           - Resume Eliquis (apixaban) tomorrow at prior dose.                            Refer to managing physician for further adjustment                            of therapy.                           - Patient has a contact number available for                            emergencies. The signs and symptoms of potential                            delayed complications were  discussed with the                            patient. Return to normal activities tomorrow.                            Written discharge instructions were provided to the                            patient.                           - Resume previous diet.                           - Continue present medications. Ladene Artist, MD 09/09/2020 8:35:15 AM This report has been signed electronically.

## 2020-09-09 NOTE — Progress Notes (Signed)
Report given to PACU, vss 

## 2020-09-09 NOTE — Progress Notes (Signed)
VS-CW 

## 2020-09-11 ENCOUNTER — Telehealth: Payer: Self-pay

## 2020-09-11 ENCOUNTER — Telehealth: Payer: Self-pay | Admitting: *Deleted

## 2020-09-11 NOTE — Telephone Encounter (Signed)
Left message on 2nd follow up call. 

## 2020-09-11 NOTE — Telephone Encounter (Signed)
Follow up call made, left message. 

## 2020-09-11 NOTE — Telephone Encounter (Signed)
Returning call to let office know that he is doing fine after his procedure.

## 2020-10-15 ENCOUNTER — Other Ambulatory Visit: Payer: Self-pay

## 2020-10-15 ENCOUNTER — Encounter (HOSPITAL_COMMUNITY): Payer: Self-pay | Admitting: Nurse Practitioner

## 2020-10-15 ENCOUNTER — Ambulatory Visit (HOSPITAL_COMMUNITY)
Admission: RE | Admit: 2020-10-15 | Discharge: 2020-10-15 | Disposition: A | Discharge status: Home / Self Care | Payer: BC Managed Care – PPO | Source: Ambulatory Visit | Attending: Nurse Practitioner | Admitting: Nurse Practitioner

## 2020-10-15 VITALS — BP 118/66 | HR 60 | Ht 75.0 in | Wt 272.0 lb

## 2020-10-15 DIAGNOSIS — D6869 Other thrombophilia: Secondary | ICD-10-CM | POA: Diagnosis not present

## 2020-10-15 DIAGNOSIS — Z7901 Long term (current) use of anticoagulants: Secondary | ICD-10-CM | POA: Insufficient documentation

## 2020-10-15 DIAGNOSIS — I4891 Unspecified atrial fibrillation: Secondary | ICD-10-CM | POA: Diagnosis present

## 2020-10-15 DIAGNOSIS — I4819 Other persistent atrial fibrillation: Secondary | ICD-10-CM | POA: Diagnosis not present

## 2020-10-15 DIAGNOSIS — I10 Essential (primary) hypertension: Secondary | ICD-10-CM | POA: Insufficient documentation

## 2020-10-15 LAB — BASIC METABOLIC PANEL
Anion gap: 8 (ref 5–15)
BUN: 15 mg/dL (ref 8–23)
CO2: 29 mmol/L (ref 22–32)
Calcium: 9.5 mg/dL (ref 8.9–10.3)
Chloride: 101 mmol/L (ref 98–111)
Creatinine, Ser: 0.98 mg/dL (ref 0.61–1.24)
GFR, Estimated: >60 mL/min (ref >60)
Glucose, Bld: 100 mg/dL — ABNORMAL HIGH (ref 70–99)
Potassium: 4.6 mmol/L (ref 3.5–5.1)
Sodium: 138 mmol/L (ref 135–145)

## 2020-10-15 LAB — MAGNESIUM: Magnesium: 2.1 mg/dL (ref 1.7–2.4)
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
## 2020-10-22 ENCOUNTER — Other Ambulatory Visit (HOSPITAL_COMMUNITY): Payer: Self-pay | Admitting: Nurse Practitioner

## 2020-11-24 ENCOUNTER — Other Ambulatory Visit (HOSPITAL_COMMUNITY): Payer: Self-pay | Admitting: *Deleted

## 2020-11-24 MED ORDER — DOFETILIDE 500 MCG PO CAPS
500.0000 ug | ORAL_CAPSULE | Freq: Two times a day (BID) | ORAL | 6 refills | Status: DC
Start: 2020-11-24 — End: 2021-10-26

## 2020-12-28 ENCOUNTER — Ambulatory Visit
Admission: EM | Admit: 2020-12-28 | Discharge: 2020-12-28 | Disposition: A | Discharge status: Home / Self Care | Payer: BC Managed Care – PPO | Attending: Emergency Medicine | Admitting: Emergency Medicine

## 2020-12-28 ENCOUNTER — Telehealth: Payer: BC Managed Care – PPO | Admitting: Physician Assistant

## 2020-12-28 ENCOUNTER — Other Ambulatory Visit: Payer: Self-pay

## 2020-12-28 DIAGNOSIS — R059 Cough, unspecified: Secondary | ICD-10-CM

## 2020-12-28 DIAGNOSIS — J069 Acute upper respiratory infection, unspecified: Secondary | ICD-10-CM | POA: Diagnosis not present

## 2020-12-28 DIAGNOSIS — Z20822 Contact with and (suspected) exposure to covid-19: Secondary | ICD-10-CM

## 2020-12-28 MED ORDER — BENZONATATE 100 MG PO CAPS: 100.0000 mg | ORAL_CAPSULE | Freq: Three times a day (TID) | ORAL | 0 refills | Status: DC | PRN

## 2020-12-28 MED ORDER — FLUTICASONE PROPIONATE 50 MCG/ACT NA SUSP
2.0000 | Freq: Every day | NASAL | 0 refills | Status: DC
Start: 2020-12-28 — End: 2020-12-31

## 2020-12-28 NOTE — ED Triage Notes (Signed)
Pt c/o cough, congestion, fatigue, body aches, and headaches since Saturday.

## 2020-12-28 NOTE — Progress Notes (Signed)
We are sorry that you are not feeling well.  Here is how we plan to help!  Based on your presentation I believe you most likely have A cough due to a virus.  This is called viral bronchitis and is best treated by rest, plenty of fluids and control of the cough.  You may use Ibuprofen or Tylenol as directed to help your symptoms.     In addition you may use A non-prescription cough medication called Robitussin DAC. Take 2 teaspoons every 8 hours or Delsym: take 2 teaspoons every 12 hours. and A prescription cough medication called Tessalon Perles 100mg . You may take 1-2 capsules every 8 hours as needed for your cough.  You can also use Flonase nasal spray for your nasal congestion. 2 sprays 1-2 times daily.   From your responses in the eVisit questionnaire you describe inflammation in the upper respiratory tract which is causing a significant cough.  This is commonly called Bronchitis and has four common causes:    Allergies  Viral Infections  Acid Reflux  Bacterial Infection Allergies, viruses and acid reflux are treated by controlling symptoms or eliminating the cause. An example might be a cough caused by taking certain blood pressure medications. You stop the cough by changing the medication. Another example might be a cough caused by acid reflux. Controlling the reflux helps control the cough.    HOME CARE . Only take medications as instructed by your medical team. . Complete the entire course of an antibiotic. . Drink plenty of fluids and get plenty of rest. . Avoid close contacts especially the very young and the elderly . Cover your mouth if you cough or cough into your sleeve. . Always remember to wash your hands . A steam or ultrasonic humidifier can help congestion.   GET HELP RIGHT AWAY IF: . You develop worsening fever. . You become short of breath . You cough up blood. . Your symptoms persist after you have completed your treatment plan MAKE SURE YOU   Understand these  instructions.  Will watch your condition.  Will get help right away if you are not doing well or get worse.  Your e-visit answers were reviewed by a board certified advanced clinical practitioner to complete your personal care plan.  Depending on the condition, your plan could have included both over the counter or prescription medications. If there is a problem please reply  once you have received a response from your provider. Your safety is important to [place].  If you have drug allergies check your prescription carefully.    You can use MyChart to ask questions about today's visit, request a non-urgent call back, or ask for a work or school excuse for 24 hours related to this e-Visit. If it has been greater than 24 hours you will need to follow up with your provider, or enter a new e-Visit to address those concerns. You will get an e-mail in the next two days asking about your experience.  I hope that your e-visit has been valuable and will speed your recovery. Thank you for using e-visits.  Greater than 5 minutes, yet less than 10 minutes of time have been spent researching, coordinating and implementing care for this patient today.
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
## 2020-12-30 LAB — NOVEL CORONAVIRUS, NAA: SARS-CoV-2, NAA: DETECTED — AB

## 2020-12-30 LAB — SARS-COV-2, NAA 2 DAY TAT

## 2020-12-31 ENCOUNTER — Telehealth (INDEPENDENT_AMBULATORY_CARE_PROVIDER_SITE_OTHER): Payer: BC Managed Care – PPO | Admitting: Adult Health

## 2020-12-31 ENCOUNTER — Telehealth: Payer: Self-pay | Admitting: *Deleted

## 2020-12-31 VITALS — Temp 98.7°F | Wt 272.0 lb

## 2020-12-31 DIAGNOSIS — U071 COVID-19: Secondary | ICD-10-CM

## 2020-12-31 MED ORDER — BENZONATATE 100 MG PO CAPS: 100.0000 mg | ORAL_CAPSULE | Freq: Three times a day (TID) | ORAL | 0 refills | Status: DC | PRN

## 2020-12-31 NOTE — Telephone Encounter (Signed)
Called to discuss with patient about COVID-19 symptoms and the use of one of the available treatments for those with mild to moderate Covid symptoms and at a high risk of hospitalization.  Pt appears to qualify for outpatient treatment due to co-morbid conditions and/or a member of an at-risk group in accordance with the FDA Emergency Use Authorization.    Symptom onset:  Vaccinated:  Booster?  Immunocompromised?  Qualifiers:   Unable to reach pt - Left VM to return call for information  Daleyssa Loiselle Kaye   
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
## 2021-01-21 ENCOUNTER — Other Ambulatory Visit: Payer: Self-pay

## 2021-01-21 ENCOUNTER — Ambulatory Visit (INDEPENDENT_AMBULATORY_CARE_PROVIDER_SITE_OTHER): Payer: BC Managed Care – PPO | Admitting: Internal Medicine

## 2021-01-21 ENCOUNTER — Encounter: Payer: Self-pay | Admitting: Internal Medicine

## 2021-01-21 VITALS — BP 120/70 | HR 62 | Ht 74.0 in | Wt 277.8 lb

## 2021-01-21 DIAGNOSIS — I493 Ventricular premature depolarization: Secondary | ICD-10-CM | POA: Diagnosis not present

## 2021-01-21 DIAGNOSIS — I10 Essential (primary) hypertension: Secondary | ICD-10-CM

## 2021-01-21 DIAGNOSIS — G4733 Obstructive sleep apnea (adult) (pediatric): Secondary | ICD-10-CM | POA: Diagnosis not present

## 2021-01-21 NOTE — Patient Instructions (Signed)
Medication Instructions:  Your physician recommends that you continue on your current medications as directed. Please refer to the Current Medication list given to you today.  Labwork: None ordered.  Testing/Procedures: None ordered.  Follow-Up: Your physician wants you to follow-up in: Afib Clinic in 3 months. They will contact you to schedule.   Any Other Special Instructions Will Be Listed Below (If Applicable).  If you need a refill on your cardiac medications before your next appointment, please call your pharmacy.
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
## 2021-01-22 ENCOUNTER — Other Ambulatory Visit (HOSPITAL_COMMUNITY): Payer: Self-pay | Admitting: Nurse Practitioner

## 2021-02-23 ENCOUNTER — Other Ambulatory Visit: Payer: Self-pay

## 2021-02-23 ENCOUNTER — Ambulatory Visit (INDEPENDENT_AMBULATORY_CARE_PROVIDER_SITE_OTHER): Payer: BC Managed Care – PPO | Admitting: Adult Health

## 2021-02-23 ENCOUNTER — Ambulatory Visit (INDEPENDENT_AMBULATORY_CARE_PROVIDER_SITE_OTHER): Payer: BC Managed Care – PPO

## 2021-02-23 ENCOUNTER — Encounter: Payer: Self-pay | Admitting: Adult Health

## 2021-02-23 VITALS — BP 110/72 | HR 60 | Temp 97.8°F | Wt 278.4 lb

## 2021-02-23 DIAGNOSIS — M5431 Sciatica, right side: Secondary | ICD-10-CM

## 2021-02-23 MED ORDER — CYCLOBENZAPRINE HCL 10 MG PO TABS: 10.0000 mg | ORAL_TABLET | Freq: Every day | ORAL | 0 refills | Status: DC
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
## 2021-03-02 ENCOUNTER — Telehealth: Payer: Self-pay

## 2021-03-02 ENCOUNTER — Other Ambulatory Visit: Payer: Self-pay | Admitting: Adult Health

## 2021-03-02 DIAGNOSIS — M5136 Other intervertebral disc degeneration, lumbar region: Secondary | ICD-10-CM

## 2021-03-02 NOTE — Telephone Encounter (Signed)
OPEN MRI ordered

## 2021-03-02 NOTE — Telephone Encounter (Signed)
Spoke with patient he is still having burning, tingling he has requested to proceed with an MRI of his lower spine, but only open MRI.

## 2021-03-03 ENCOUNTER — Other Ambulatory Visit (HOSPITAL_COMMUNITY): Payer: Self-pay | Admitting: Physician Assistant

## 2021-03-03 NOTE — Telephone Encounter (Signed)
Informed patient of message.  

## 2021-03-08 ENCOUNTER — Other Ambulatory Visit: Payer: Self-pay | Admitting: Student

## 2021-03-09 ENCOUNTER — Other Ambulatory Visit: Payer: Self-pay

## 2021-03-09 MED ORDER — ENTRESTO 24-26 MG PO TABS: 1.0000 | ORAL_TABLET | Freq: Two times a day (BID) | ORAL | 3 refills | Status: DC

## 2021-03-21 ENCOUNTER — Ambulatory Visit
Admission: RE | Admit: 2021-03-21 | Discharge: 2021-03-21 | Disposition: A | Discharge status: Home / Self Care | Payer: BC Managed Care – PPO | Source: Ambulatory Visit | Attending: Adult Health | Admitting: Adult Health

## 2021-03-21 ENCOUNTER — Other Ambulatory Visit: Payer: Self-pay

## 2021-03-21 DIAGNOSIS — M5136 Other intervertebral disc degeneration, lumbar region: Secondary | ICD-10-CM

## 2021-03-23 ENCOUNTER — Telehealth: Payer: Self-pay | Admitting: Adult Health

## 2021-03-23 ENCOUNTER — Other Ambulatory Visit: Payer: Self-pay | Admitting: Adult Health

## 2021-03-23 MED ORDER — ALPRAZOLAM 0.5 MG PO TABS: ORAL_TABLET | ORAL | 0 refills | Status: DC

## 2021-03-23 NOTE — Telephone Encounter (Signed)
The patient is catastrophic. He is scheduled for  his MRI ON 03/28/2021 at @7 :00 PM  and would like a medication called into HIS [employer], [place]  Phone:  [phone] Fax:  [phone]

## 2021-03-23 NOTE — Telephone Encounter (Signed)
Xanax sent to pharmacy. This will make him drowsy so he will need a driver

## 2021-03-23 NOTE — Telephone Encounter (Signed)
Patient informed of the message below and stated his son will drive him to the appt.

## 2021-03-28 ENCOUNTER — Other Ambulatory Visit: Payer: Self-pay

## 2021-03-28 ENCOUNTER — Ambulatory Visit
Admission: RE | Admit: 2021-03-28 | Discharge: 2021-03-28 | Disposition: A | Discharge status: Home / Self Care | Payer: BC Managed Care – PPO | Source: Ambulatory Visit | Attending: Adult Health | Admitting: Adult Health

## 2021-03-29 ENCOUNTER — Other Ambulatory Visit: Payer: Self-pay | Admitting: Student

## 2021-03-31 ENCOUNTER — Telehealth: Payer: Self-pay | Admitting: Adult Health

## 2021-03-31 ENCOUNTER — Other Ambulatory Visit: Payer: Self-pay | Admitting: Adult Health

## 2021-03-31 DIAGNOSIS — M5136 Other intervertebral disc degeneration, lumbar region: Secondary | ICD-10-CM

## 2021-03-31 NOTE — Telephone Encounter (Signed)
Referral placed.

## 2021-03-31 NOTE — Telephone Encounter (Addendum)
Patient is calling and wanted to speak to someone regarding getting a referral to [facility] to see Dr. Zada Finders for back pain, please advise. CB is [phone]
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
## 2021-04-30 ENCOUNTER — Ambulatory Visit (HOSPITAL_COMMUNITY)
Admission: RE | Admit: 2021-04-30 | Discharge: 2021-04-30 | Disposition: A | Discharge status: Home / Self Care | Payer: BC Managed Care – PPO | Source: Ambulatory Visit | Attending: Physician Assistant | Admitting: Physician Assistant

## 2021-04-30 ENCOUNTER — Other Ambulatory Visit: Payer: Self-pay

## 2021-04-30 VITALS — BP 108/66 | HR 58 | Ht 74.0 in | Wt 277.4 lb

## 2021-04-30 DIAGNOSIS — I10 Essential (primary) hypertension: Secondary | ICD-10-CM | POA: Insufficient documentation

## 2021-04-30 DIAGNOSIS — D6869 Other thrombophilia: Secondary | ICD-10-CM

## 2021-04-30 DIAGNOSIS — Z9989 Dependence on other enabling machines and devices: Secondary | ICD-10-CM | POA: Diagnosis not present

## 2021-04-30 DIAGNOSIS — E669 Obesity, unspecified: Secondary | ICD-10-CM | POA: Insufficient documentation

## 2021-04-30 DIAGNOSIS — Z6836 Body mass index (BMI) 36.0-36.9, adult: Secondary | ICD-10-CM | POA: Insufficient documentation

## 2021-04-30 DIAGNOSIS — I519 Heart disease, unspecified: Secondary | ICD-10-CM | POA: Insufficient documentation

## 2021-04-30 DIAGNOSIS — I4819 Other persistent atrial fibrillation: Secondary | ICD-10-CM | POA: Insufficient documentation

## 2021-04-30 DIAGNOSIS — G4733 Obstructive sleep apnea (adult) (pediatric): Secondary | ICD-10-CM | POA: Diagnosis not present

## 2021-04-30 LAB — CBC
HCT: 44.3 % (ref 39.0–52.0)
Hemoglobin: 15.6 g/dL (ref 13.0–17.0)
MCH: 32.5 pg (ref 26.0–34.0)
MCHC: 35.2 g/dL (ref 30.0–36.0)
MCV: 92.3 fL (ref 80.0–100.0)
Platelets: 213 10*3/uL (ref 150–400)
RBC: 4.8 MIL/uL (ref 4.22–5.81)
RDW: 12.8 % (ref 11.5–15.5)
WBC: 7.5 10*3/uL (ref 4.0–10.5)
nRBC: 0 % (ref 0.0–0.2)

## 2021-04-30 LAB — BASIC METABOLIC PANEL
Anion gap: 5 (ref 5–15)
BUN: 9 mg/dL (ref 8–23)
CO2: 26 mmol/L (ref 22–32)
Calcium: 9.7 mg/dL (ref 8.9–10.3)
Chloride: 105 mmol/L (ref 98–111)
Creatinine, Ser: 0.85 mg/dL (ref 0.61–1.24)
GFR, Estimated: >60 mL/min (ref >60)
Glucose, Bld: 121 mg/dL — ABNORMAL HIGH (ref 70–99)
Potassium: 4 mmol/L (ref 3.5–5.1)
Sodium: 136 mmol/L (ref 135–145)

## 2021-04-30 LAB — MAGNESIUM: Magnesium: 2 mg/dL (ref 1.7–2.4)

## 2021-05-17 ENCOUNTER — Other Ambulatory Visit (HOSPITAL_COMMUNITY): Payer: Self-pay | Admitting: Nurse Practitioner

## 2021-08-31 ENCOUNTER — Encounter: Payer: Self-pay | Admitting: Family Medicine

## 2021-08-31 ENCOUNTER — Telehealth (INDEPENDENT_AMBULATORY_CARE_PROVIDER_SITE_OTHER): Payer: BC Managed Care – PPO | Admitting: Family Medicine

## 2021-08-31 DIAGNOSIS — R202 Paresthesia of skin: Secondary | ICD-10-CM | POA: Diagnosis not present

## 2021-08-31 DIAGNOSIS — M542 Cervicalgia: Secondary | ICD-10-CM | POA: Diagnosis not present

## 2021-08-31 DIAGNOSIS — M79601 Pain in right arm: Secondary | ICD-10-CM | POA: Diagnosis not present
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
## 2021-08-31 NOTE — Patient Instructions (Signed)
Please seek inperson care today as we discuss.   I hope you are feeling better soon!  It was nice to meet you today. I help [facility] out with telemedicine visits on Tuesdays and Thursdays and am available for visits on those days. If you have any concerns or questions following this visit please schedule a follow up visit with your Primary Care doctor or seek care at a local urgent care clinic to avoid delays in care.

## 2021-09-01 ENCOUNTER — Telehealth: Payer: Self-pay | Admitting: Adult Health

## 2021-09-01 DIAGNOSIS — M5431 Sciatica, right side: Secondary | ICD-10-CM

## 2021-09-01 DIAGNOSIS — M542 Cervicalgia: Secondary | ICD-10-CM

## 2021-09-01 MED ORDER — CYCLOBENZAPRINE HCL 10 MG PO TABS: 10.0000 mg | ORAL_TABLET | Freq: Every day | ORAL | 0 refills | Status: DC

## 2021-09-01 NOTE — Telephone Encounter (Signed)
Pt states he would like a referral to orthopedic; he attempted to go to [facility], The last pm however, the charge was too high.  Is also requesting refill of cyclobenzaprine; he took one last pm that was helpful; pain 4-5 on 0-10 pain scale. Can send to pharmacy on file if refilled. States pain with movement & without meds is 9 on 0-10 pain scale.

## 2021-09-01 NOTE — Telephone Encounter (Signed)
Verbally discussed patient concern/request with PCP. Orders received.

## 2021-09-01 NOTE — Addendum Note (Signed)
Addended by: Elza Rafter D on: 09/01/2021 10:30 AM   Modules accepted: Orders

## 2021-09-01 NOTE — Telephone Encounter (Signed)
Patient stated that he had a virtual visit with Dr.Kim yesterday for pain in his neck and shoulders. He is requesting a phone call back from someone to discuss possibly getting a referral.   Patient could be contacted at [phone].  Please advise.

## 2021-09-03 ENCOUNTER — Other Ambulatory Visit: Payer: Self-pay

## 2021-09-03 ENCOUNTER — Ambulatory Visit (INDEPENDENT_AMBULATORY_CARE_PROVIDER_SITE_OTHER): Payer: BC Managed Care – PPO | Admitting: Orthopaedic Surgery

## 2021-09-03 ENCOUNTER — Ambulatory Visit (HOSPITAL_BASED_OUTPATIENT_CLINIC_OR_DEPARTMENT_OTHER)
Admission: RE | Admit: 2021-09-03 | Discharge: 2021-09-03 | Disposition: A | Discharge status: Home / Self Care | Payer: BC Managed Care – PPO | Source: Ambulatory Visit | Attending: Orthopaedic Surgery | Admitting: Orthopaedic Surgery

## 2021-09-03 VITALS — BP 136/77 | Ht 75.5 in | Wt 270.0 lb

## 2021-09-03 DIAGNOSIS — M25511 Pain in right shoulder: Secondary | ICD-10-CM | POA: Diagnosis present

## 2021-09-03 DIAGNOSIS — M7918 Myalgia, other site: Secondary | ICD-10-CM

## 2021-09-03 DIAGNOSIS — M542 Cervicalgia: Secondary | ICD-10-CM

## 2021-09-03 MED ORDER — METHOCARBAMOL 500 MG PO TABS: 500.0000 mg | ORAL_TABLET | Freq: Four times a day (QID) | ORAL | 0 refills | Status: AC

## 2021-09-03 MED ORDER — KETOROLAC TROMETHAMINE 10 MG PO TABS: 10.0000 mg | ORAL_TABLET | Freq: Three times a day (TID) | ORAL | 0 refills | Status: AC | PRN
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
## 2021-10-11 ENCOUNTER — Other Ambulatory Visit (HOSPITAL_COMMUNITY): Payer: Self-pay | Admitting: Physician Assistant

## 2021-10-26 ENCOUNTER — Other Ambulatory Visit (HOSPITAL_COMMUNITY): Payer: Self-pay | Admitting: Nurse Practitioner

## 2021-10-27 ENCOUNTER — Ambulatory Visit (HOSPITAL_COMMUNITY): Payer: BC Managed Care – PPO | Admitting: Nurse Practitioner

## 2021-11-16 ENCOUNTER — Ambulatory Visit (HOSPITAL_COMMUNITY)
Admission: RE | Admit: 2021-11-16 | Discharge: 2021-11-16 | Disposition: A | Discharge status: Home / Self Care | Payer: BC Managed Care – PPO | Source: Ambulatory Visit | Attending: Nurse Practitioner | Admitting: Nurse Practitioner

## 2021-11-16 ENCOUNTER — Encounter (HOSPITAL_COMMUNITY): Payer: Self-pay | Admitting: Nurse Practitioner

## 2021-11-16 ENCOUNTER — Other Ambulatory Visit: Payer: Self-pay

## 2021-11-16 VITALS — BP 98/62 | HR 56 | Ht 75.5 in | Wt 274.0 lb

## 2021-11-16 DIAGNOSIS — I4819 Other persistent atrial fibrillation: Secondary | ICD-10-CM | POA: Insufficient documentation

## 2021-11-16 DIAGNOSIS — Z7901 Long term (current) use of anticoagulants: Secondary | ICD-10-CM | POA: Diagnosis not present

## 2021-11-16 DIAGNOSIS — I110 Hypertensive heart disease with heart failure: Secondary | ICD-10-CM | POA: Diagnosis not present

## 2021-11-16 DIAGNOSIS — Z79899 Other long term (current) drug therapy: Secondary | ICD-10-CM | POA: Insufficient documentation

## 2021-11-16 DIAGNOSIS — D6869 Other thrombophilia: Secondary | ICD-10-CM | POA: Diagnosis not present

## 2021-11-16 DIAGNOSIS — E669 Obesity, unspecified: Secondary | ICD-10-CM | POA: Diagnosis not present

## 2021-11-16 DIAGNOSIS — G4733 Obstructive sleep apnea (adult) (pediatric): Secondary | ICD-10-CM | POA: Insufficient documentation

## 2021-11-16 DIAGNOSIS — I501 Left ventricular failure: Secondary | ICD-10-CM | POA: Insufficient documentation

## 2021-11-16 DIAGNOSIS — I4892 Unspecified atrial flutter: Secondary | ICD-10-CM | POA: Insufficient documentation

## 2021-11-16 LAB — MAGNESIUM: Magnesium: 2.2 mg/dL (ref 1.7–2.4)

## 2021-11-16 LAB — BASIC METABOLIC PANEL
Anion gap: 7 (ref 5–15)
BUN: 13 mg/dL (ref 8–23)
CO2: 27 mmol/L (ref 22–32)
Calcium: 9.7 mg/dL (ref 8.9–10.3)
Chloride: 104 mmol/L (ref 98–111)
Creatinine, Ser: 0.95 mg/dL (ref 0.61–1.24)
GFR, Estimated: >60 mL/min (ref >60)
Glucose, Bld: 109 mg/dL — ABNORMAL HIGH (ref 70–99)
Potassium: 4.6 mmol/L (ref 3.5–5.1)
Sodium: 138 mmol/L (ref 135–145)

## 2021-11-16 MED ORDER — METOPROLOL SUCCINATE ER 50 MG PO TB24: 50.0000 mg | ORAL_TABLET | Freq: Two times a day (BID) | ORAL | 1 refills | Status: DC

## 2021-11-16 NOTE — Addendum Note (Signed)
Encounter addended by: Enid Derry, CMA on: 11/16/2021 10:46 AM  Actions taken: Pharmacy for encounter modified, Order list changed
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
## 2021-12-01 ENCOUNTER — Telehealth: Payer: Self-pay | Admitting: Adult Health

## 2021-12-01 NOTE — Telephone Encounter (Signed)
Patient calling in with respiratory symptoms: Shortness of breath, chest pain, palpitations or other red words send to Triage  Does the patient have a fever over 100, cough, congestion, sore throat, runny nose, lost of taste/smell (please list symptoms that patient has)? Little sore throat What date did symptoms start?11-27-2021 (If over 5 days ago, pt may be scheduled for in person visit)  Have you tested for Covid in the last 5 days? No   If yes, was it positive []  OR negative [] ? If positive in the last 5 days, please schedule virtual visit now. If negative, schedule for an in person OV with the next available provider if PCP has no openings. Please also let patient know they will be tested again (follow the script below)  "you will have to arrive 35mins prior to your appt time to be Covid tested. Please park in back of office at the cone & call [phone] to let the staff know you have arrived. A staff member will meet you at your car to do a rapid covid test. Once the test has resulted you will be notified by phone of your results to determine if appt will remain an in person visit or be converted to a virtual/phone visit. If you arrive less than 17mins before your appt time, your visit will be automatically converted to virtual & any recommended testing will happen AFTER the visit." Pt has virtual with cory on 12-02-2021 11 am  THINGS TO REMEMBER  If no availability for virtual visit in office,  please schedule another [place] office  If no availability at another [place] office, please instruct patient that they can schedule an evisit or virtual visit through their mychart account. Visits up to 8pm  patients can be seen in office 5 days after positive COVID test

## 2021-12-02 ENCOUNTER — Telehealth (INDEPENDENT_AMBULATORY_CARE_PROVIDER_SITE_OTHER): Payer: BC Managed Care – PPO | Admitting: Adult Health

## 2021-12-02 DIAGNOSIS — J0100 Acute maxillary sinusitis, unspecified: Secondary | ICD-10-CM

## 2021-12-02 MED ORDER — DOXYCYCLINE HYCLATE 100 MG PO CAPS: 100.0000 mg | ORAL_CAPSULE | Freq: Two times a day (BID) | ORAL | 0 refills | Status: DC
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
## 2021-12-16 ENCOUNTER — Telehealth: Payer: Self-pay

## 2021-12-16 NOTE — Telephone Encounter (Signed)
--  Douglas Chandler states he developed cold, headache, sore throat, low grade fever, muscle aches yesterday. No severe breathing difficulty or blueness around his lips. No chest pain. Alert and responsive. (Exposed to [place] via family members)  12/16/2021 7:22:10 AM Porter, RN, [place]

## 2021-12-17 ENCOUNTER — Other Ambulatory Visit: Payer: Self-pay

## 2021-12-17 ENCOUNTER — Telehealth (INDEPENDENT_AMBULATORY_CARE_PROVIDER_SITE_OTHER): Payer: BC Managed Care – PPO | Admitting: Adult Health

## 2021-12-17 DIAGNOSIS — U071 COVID-19: Secondary | ICD-10-CM

## 2021-12-17 MED ORDER — MOLNUPIRAVIR EUA 200MG CAPSULE: 4.0000 | ORAL_CAPSULE | Freq: Two times a day (BID) | ORAL | 0 refills | Status: DC

## 2021-12-17 MED ORDER — HYDROCODONE BIT-HOMATROP MBR 5-1.5 MG/5ML PO SOLN: 5.0000 mL | Freq: Three times a day (TID) | ORAL | 0 refills | Status: DC | PRN
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
## 2021-12-18 ENCOUNTER — Other Ambulatory Visit: Payer: Self-pay | Admitting: Adult Health

## 2021-12-18 DIAGNOSIS — U071 COVID-19: Secondary | ICD-10-CM

## 2021-12-18 MED ORDER — MOLNUPIRAVIR EUA 200MG CAPSULE: 4.0000 | ORAL_CAPSULE | Freq: Two times a day (BID) | ORAL | 0 refills | Status: AC

## 2022-01-12 ENCOUNTER — Encounter: Payer: Self-pay | Admitting: Adult Health

## 2022-01-12 ENCOUNTER — Ambulatory Visit (INDEPENDENT_AMBULATORY_CARE_PROVIDER_SITE_OTHER): Payer: BC Managed Care – PPO | Admitting: Adult Health

## 2022-01-12 VITALS — BP 110/70 | HR 61 | Temp 97.9°F | Ht 73.0 in | Wt 278.0 lb

## 2022-01-12 DIAGNOSIS — Z23 Encounter for immunization: Secondary | ICD-10-CM

## 2022-01-12 DIAGNOSIS — I5022 Chronic systolic (congestive) heart failure: Secondary | ICD-10-CM | POA: Diagnosis not present

## 2022-01-12 DIAGNOSIS — I110 Hypertensive heart disease with heart failure: Secondary | ICD-10-CM | POA: Diagnosis not present

## 2022-01-12 DIAGNOSIS — I4819 Other persistent atrial fibrillation: Secondary | ICD-10-CM | POA: Diagnosis not present

## 2022-01-12 DIAGNOSIS — N62 Hypertrophy of breast: Secondary | ICD-10-CM

## 2022-01-12 DIAGNOSIS — E668 Other obesity: Secondary | ICD-10-CM | POA: Diagnosis not present

## 2022-01-12 DIAGNOSIS — I10 Essential (primary) hypertension: Secondary | ICD-10-CM

## 2022-01-12 DIAGNOSIS — Z125 Encounter for screening for malignant neoplasm of prostate: Secondary | ICD-10-CM | POA: Diagnosis not present

## 2022-01-12 DIAGNOSIS — G4733 Obstructive sleep apnea (adult) (pediatric): Secondary | ICD-10-CM

## 2022-01-12 DIAGNOSIS — Z0001 Encounter for general adult medical examination with abnormal findings: Secondary | ICD-10-CM

## 2022-01-12 DIAGNOSIS — I509 Heart failure, unspecified: Secondary | ICD-10-CM | POA: Diagnosis not present

## 2022-01-12 DIAGNOSIS — Z6838 Body mass index (BMI) 38.0-38.9, adult: Secondary | ICD-10-CM

## 2022-01-12 LAB — COMPREHENSIVE METABOLIC PANEL
ALT: 29 U/L (ref 0–53)
AST: 22 U/L (ref 0–37)
Albumin: 4.6 g/dL (ref 3.5–5.2)
Alkaline Phosphatase: 50 U/L (ref 39–117)
BUN: 13 mg/dL (ref 6–23)
CO2: 29 mEq/L (ref 19–32)
Calcium: 9.8 mg/dL (ref 8.4–10.5)
Chloride: 102 mEq/L (ref 96–112)
Creatinine, Ser: 0.91 mg/dL (ref 0.40–1.50)
GFR: 89.95 mL/min (ref >60.00)
Glucose, Bld: 96 mg/dL (ref 70–99)
Potassium: 4.2 mEq/L (ref 3.5–5.1)
Sodium: 138 mEq/L (ref 135–145)
Total Bilirubin: 1 mg/dL (ref 0.2–1.2)
Total Protein: 7.5 g/dL (ref 6.0–8.3)

## 2022-01-12 LAB — PSA: PSA: 0.27 ng/mL (ref 0.10–4.00)

## 2022-01-12 LAB — CBC WITH DIFFERENTIAL/PLATELET
Basophils Absolute: 0 10*3/uL (ref 0.0–0.1)
Basophils Relative: 0.6 % (ref 0.0–3.0)
Eosinophils Absolute: 0.2 10*3/uL (ref 0.0–0.7)
Eosinophils Relative: 2.3 % (ref 0.0–5.0)
HCT: 44.6 % (ref 39.0–52.0)
Hemoglobin: 15.5 g/dL (ref 13.0–17.0)
Lymphocytes Relative: 25 % (ref 12.0–46.0)
Lymphs Abs: 1.9 10*3/uL (ref 0.7–4.0)
MCHC: 34.9 g/dL (ref 30.0–36.0)
MCV: 91 fl (ref 78.0–100.0)
Monocytes Absolute: 0.7 10*3/uL (ref 0.1–1.0)
Monocytes Relative: 9 % (ref 3.0–12.0)
Neutro Abs: 4.7 10*3/uL (ref 1.4–7.7)
Neutrophils Relative %: 63.1 % (ref 43.0–77.0)
Platelets: 201 10*3/uL (ref 150.0–400.0)
RBC: 4.9 Mil/uL (ref 4.22–5.81)
RDW: 13.2 % (ref 11.5–15.5)
WBC: 7.5 10*3/uL (ref 4.0–10.5)

## 2022-01-12 LAB — LUTEINIZING HORMONE: LH: 4.06 m[IU]/mL (ref 1.50–9.30)

## 2022-01-12 LAB — LDL CHOLESTEROL, DIRECT: Direct LDL: 120 mg/dL

## 2022-01-12 LAB — LIPID PANEL
Cholesterol: 181 mg/dL (ref 0–200)
HDL: 29.1 mg/dL — ABNORMAL LOW (ref >39.00)
NonHDL: 151.82
Total CHOL/HDL Ratio: 6
Triglycerides: 247 mg/dL — ABNORMAL HIGH (ref 0.0–149.0)
VLDL: 49.4 mg/dL — ABNORMAL HIGH (ref 0.0–40.0)

## 2022-01-12 LAB — ESTRADIOL: Estradiol: 49 pg/mL — ABNORMAL HIGH (ref <=39)

## 2022-01-12 LAB — TSH: TSH: 1.19 u[IU]/mL (ref 0.35–5.50)

## 2022-01-12 LAB — HEMOGLOBIN A1C: Hgb A1c MFr Bld: 5.7 % (ref 4.6–6.5)

## 2022-01-12 LAB — HCG, SERUM, QUALITATIVE: Preg, Serum: NEGATIVE

## 2022-01-12 NOTE — Patient Instructions (Signed)
It was great seeing you today   We will follow up with you regarding your lab work   Please let me know if you need anything   
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
## 2022-01-13 ENCOUNTER — Other Ambulatory Visit: Payer: Self-pay | Admitting: Adult Health

## 2022-01-13 MED ORDER — ROSUVASTATIN CALCIUM 10 MG PO TABS: 10.0000 mg | ORAL_TABLET | Freq: Every day | ORAL | 3 refills | Status: DC

## 2022-01-14 ENCOUNTER — Telehealth: Payer: Self-pay | Admitting: Adult Health

## 2022-01-14 LAB — TESTOSTERONE, FREE, TOTAL, SHBG
Sex Hormone Binding: 45.4 nmol/L (ref 19.3–76.4)
Testosterone, Free: 7.2 pg/mL (ref 6.6–18.1)
Testosterone: 398 ng/dL (ref 264–916)

## 2022-01-14 NOTE — Telephone Encounter (Signed)
Updated patient on reply from cardiology about stopping Spirolactone due to [facility]   "added this in 2021 when his EF was 35%. A f/u echo in SR showed return of EF to normal. It is reasonable to stop spiro, just let the pt know to watch for any ankle edema or shortness of breath.  He cannot use HCTZ  with dofetilide. If retains fluid, would recommend low dose lasix."

## 2022-02-18 ENCOUNTER — Other Ambulatory Visit (HOSPITAL_COMMUNITY): Payer: Self-pay | Admitting: Nurse Practitioner

## 2022-02-24 ENCOUNTER — Ambulatory Visit (INDEPENDENT_AMBULATORY_CARE_PROVIDER_SITE_OTHER): Payer: BC Managed Care – PPO | Admitting: Adult Health

## 2022-02-24 ENCOUNTER — Encounter: Payer: Self-pay | Admitting: Adult Health

## 2022-02-24 VITALS — BP 130/80 | HR 76 | Temp 98.5°F | Ht 73.0 in | Wt 280.0 lb

## 2022-02-24 DIAGNOSIS — E782 Mixed hyperlipidemia: Secondary | ICD-10-CM | POA: Diagnosis not present

## 2022-02-24 DIAGNOSIS — K5903 Drug induced constipation: Secondary | ICD-10-CM | POA: Diagnosis not present

## 2022-02-24 MED ORDER — ATORVASTATIN CALCIUM 10 MG PO TABS
10.0000 mg | ORAL_TABLET | Freq: Every day | ORAL | 3 refills | Status: DC
Start: 2022-02-24 — End: 2023-02-17
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
## 2022-03-10 ENCOUNTER — Other Ambulatory Visit: Payer: Self-pay | Admitting: Internal Medicine

## 2022-03-14 ENCOUNTER — Other Ambulatory Visit (HOSPITAL_COMMUNITY): Payer: Self-pay | Admitting: *Deleted

## 2022-03-14 ENCOUNTER — Other Ambulatory Visit: Payer: Self-pay | Admitting: Internal Medicine

## 2022-03-14 ENCOUNTER — Telehealth: Payer: Self-pay | Admitting: Internal Medicine

## 2022-03-14 MED ORDER — ENTRESTO 24-26 MG PO TABS: 1.0000 | ORAL_TABLET | Freq: Two times a day (BID) | ORAL | 0 refills | Status: DC

## 2022-03-14 NOTE — Telephone Encounter (Signed)
This is a A-Fib clinic pt 

## 2022-03-14 NOTE — Telephone Encounter (Signed)
?*  STAT* If patient is at the pharmacy, call can be transferred to refill team. ? ? ?1. Which medications need to be refilled? (please list name of each medication and dose if known)  ? sacubitril-valsartan (ENTRESTO) 24-26 MG Take 1 tablet by mouth 2 (two) times daily.  ?  ? ? ?2. Which pharmacy/location (including street and city if local pharmacy) is medication to be sent to?[place], [place] [facility] [place] ? ?3. Do they need a 30 day or 90 day supply? 90 ds ? ?

## 2022-03-14 NOTE — Telephone Encounter (Signed)
Dr. Rayann Heman prescribed most of the medications, but pt is being seen at the A-Fib clinic for Dr. Rayann Heman and has not been released yet. Please address refill, thanks ?

## 2022-03-14 NOTE — Telephone Encounter (Signed)
This is a A-Fib clinic pt, pt is being seen there for Dr. Rayann Heman. Please address refill thanks ?

## 2022-03-28 ENCOUNTER — Telehealth: Payer: Self-pay | Admitting: Adult Health

## 2022-03-28 NOTE — Telephone Encounter (Signed)
Pt called needing Rx refill for Flonase sent to  ?[facility], [place] Phone:  [phone]  ?Fax:  [phone]  ?  ? ?Please advise.  ?

## 2022-03-29 NOTE — Telephone Encounter (Signed)
Patient called to follow up on refill for fluticasone (FLONASE) 50 MCG/ACT nasal spray ? ? ? ?Please send to  ? ?[facility], [place] Phone:  [phone]  ?Fax:  [phone]  ?  ? ? ? ? ? ? ?Please advise   ? ? ?

## 2022-03-30 ENCOUNTER — Other Ambulatory Visit: Payer: Self-pay

## 2022-03-30 MED ORDER — FLUTICASONE PROPIONATE 50 MCG/ACT NA SUSP: 2.0000 | Freq: Every day | NASAL | 6 refills | Status: DC | PRN

## 2022-03-30 NOTE — Telephone Encounter (Signed)
Rx refilled.

## 2022-04-11 ENCOUNTER — Ambulatory Visit (INDEPENDENT_AMBULATORY_CARE_PROVIDER_SITE_OTHER): Payer: BC Managed Care – PPO | Admitting: *Deleted

## 2022-04-11 DIAGNOSIS — Z23 Encounter for immunization: Secondary | ICD-10-CM | POA: Diagnosis not present

## 2022-05-17 ENCOUNTER — Encounter: Payer: Self-pay | Admitting: Adult Health

## 2022-05-17 ENCOUNTER — Ambulatory Visit (INDEPENDENT_AMBULATORY_CARE_PROVIDER_SITE_OTHER): Payer: BC Managed Care – PPO | Admitting: Adult Health

## 2022-05-17 VITALS — BP 120/72 | HR 67 | Temp 98.0°F | Ht 73.0 in | Wt 280.0 lb

## 2022-05-17 DIAGNOSIS — R040 Epistaxis: Secondary | ICD-10-CM
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
## 2022-05-22 ENCOUNTER — Other Ambulatory Visit (HOSPITAL_COMMUNITY): Payer: Self-pay | Admitting: Nurse Practitioner

## 2022-05-23 ENCOUNTER — Other Ambulatory Visit (HOSPITAL_COMMUNITY): Payer: Self-pay | Admitting: *Deleted

## 2022-06-02 ENCOUNTER — Ambulatory Visit (HOSPITAL_COMMUNITY): Payer: BC Managed Care – PPO | Admitting: Nurse Practitioner

## 2022-06-03 ENCOUNTER — Other Ambulatory Visit (HOSPITAL_COMMUNITY): Payer: Self-pay | Admitting: Nurse Practitioner

## 2022-06-05 ENCOUNTER — Other Ambulatory Visit: Payer: Self-pay | Admitting: Internal Medicine

## 2022-06-08 ENCOUNTER — Ambulatory Visit (HOSPITAL_COMMUNITY): Payer: BC Managed Care – PPO | Admitting: Nurse Practitioner

## 2022-06-09 ENCOUNTER — Other Ambulatory Visit: Payer: Self-pay | Admitting: Internal Medicine

## 2022-06-10 ENCOUNTER — Other Ambulatory Visit: Payer: Self-pay | Admitting: Internal Medicine

## 2022-06-24 ENCOUNTER — Encounter: Payer: Self-pay | Admitting: Adult Health

## 2022-06-24 ENCOUNTER — Ambulatory Visit (INDEPENDENT_AMBULATORY_CARE_PROVIDER_SITE_OTHER): Payer: BC Managed Care – PPO | Admitting: Adult Health

## 2022-06-24 VITALS — BP 120/62 | HR 60 | Temp 98.0°F | Ht 73.0 in | Wt 280.0 lb

## 2022-06-24 DIAGNOSIS — J029 Acute pharyngitis, unspecified: Secondary | ICD-10-CM

## 2022-06-24 LAB — POCT RAPID STREP A (OFFICE): Rapid Strep A Screen: NEGATIVE
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
## 2022-06-29 ENCOUNTER — Ambulatory Visit (INDEPENDENT_AMBULATORY_CARE_PROVIDER_SITE_OTHER): Payer: BC Managed Care – PPO

## 2022-06-29 ENCOUNTER — Ambulatory Visit (INDEPENDENT_AMBULATORY_CARE_PROVIDER_SITE_OTHER): Payer: BC Managed Care – PPO | Admitting: Cardiology

## 2022-06-29 ENCOUNTER — Encounter: Payer: Self-pay | Admitting: Cardiology

## 2022-06-29 VITALS — BP 118/68 | HR 56 | Ht 75.0 in | Wt 282.6 lb

## 2022-06-29 DIAGNOSIS — Z79899 Other long term (current) drug therapy: Secondary | ICD-10-CM | POA: Diagnosis not present

## 2022-06-29 DIAGNOSIS — I4819 Other persistent atrial fibrillation: Secondary | ICD-10-CM | POA: Diagnosis not present
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
## 2022-06-29 NOTE — Progress Notes (Unsigned)
Enrolled patient for a 7 day Zio XT monitor to be mailed to patients home.  

## 2022-06-29 NOTE — Patient Instructions (Addendum)
Medication Instructions:  Your physician recommends that you continue on your current medications as directed. Please refer to the Current Medication list given to you today.  *If you need a refill on your cardiac medications before your next appointment, please call your pharmacy*   Lab Work: TODAY: BMET, Mag  If you have labs (blood work) drawn today and your tests are completely normal, you will receive your results only by: [place] (if you have MyChart) OR A paper copy in the mail If you have any lab test that is abnormal or we need to change your treatment, we will call you to review the results.    Follow-Up: At [facility], you and your health needs are our priority.  As part of our continuing mission to provide you with exceptional heart care, we have created designated Provider Care Teams.  These Care Teams include your primary Cardiologist (physician) and Advanced Practice Providers (APPs -  Physician Assistants and Nurse Practitioners) who all work together to provide you with the care you need, when you need it.  We recommend signing up for the patient portal called "MyChart".  Sign up information is provided on this After Visit Summary.  MyChart is used to connect with patients for Virtual Visits (Telemedicine).  Patients are able to view lab/test results, encounter notes, upcoming appointments, etc.  Non-urgent messages can be sent to your provider as well.   To learn more about what you can do with MyChart, go to [email or web address].    Your next appointment:   4 month(s)  The format for your next appointment:   In Person  Provider:   You will follow up in the [facility] located at [facility]. Your provider will be: Roderic Palau, NP or Clint R. Fenton, PA-C    Other Instructions AFIB CLINIC INFORMATION: Your appointment is scheduled on:           at              . Please arrive 15 minutes early for check-in. The AFib Clinic  is located in the Heart and Vascular Specialty Clinics at [facility]. Parking instructions/directions: [address] (off [address]). When you pull in to Entrance C, there is an underground parking garage to your right. The code to enter the garage is 1603 (Nov). Take the elevators to the first floor. Follow the signs to the Heart and Vascular Specialty Clinics. You will see registration at the end of the hallway.  Phone number: [phone]   [place] Monitor Instructions  Your physician has requested you wear a ZIO patch monitor for 7 days.  This is a single patch monitor. [employer] supplies one patch monitor per enrollment. Additional stickers are not available. Please do not apply patch if you will be having a Nuclear Stress Test,  Echocardiogram, Cardiac CT, MRI, or Chest Xray during the period you would be wearing the  monitor. The patch cannot be worn during these tests. You cannot remove and re-apply the  ZIO XT patch monitor.  Your ZIO patch monitor will be mailed 3 day USPS to your address on file. It may take 3-5 days  to receive your monitor after you have been enrolled.  Once you have received your monitor, please review the enclosed instructions. Your monitor  has already been registered assigning a specific monitor serial # to you.  Billing and Patient Assistance Program Information  We have supplied [employer] with any of your insurance  information on file for billing purposes. [employer] offers a sliding scale Patient Assistance Program for patients that do not have  insurance, or whose insurance does not completely cover the cost of the ZIO monitor.  You must apply for the Patient Assistance Program to qualify for this discounted rate.  To apply, please call [employer] at [phone], select option 4, select option 2, ask to apply for  Patient Assistance Program. Theodore Demark will ask your household income, and how many people  are in your household. They will quote  your out-of-pocket cost based on that information.  [employer] will also be able to set up a 68-month interest-free payment plan if needed.  Applying the monitor   Shave hair from upper left chest.  Hold abrader disc by orange tab. Rub abrader in 40 strokes over the upper left chest as  indicated in your monitor instructions.  Clean area with 4 enclosed alcohol pads. Let dry.  Apply patch as indicated in monitor instructions. Patch will be placed under collarbone on left  side of chest with arrow pointing upward.  Rub patch adhesive wings for 2 minutes. Remove white label marked "1". Remove the white  label marked "2". Rub patch adhesive wings for 2 additional minutes.  While looking in a mirror, press and release button in center of patch. A small green light will  flash 3-4 times. This will be your only indicator that the monitor has been turned on.  Do not shower for the first 24 hours. You may shower after the first 24 hours.  Press the button if you feel a symptom. You will hear a small click. Record Date, Time and  Symptom in the Patient Logbook.  When you are ready to remove the patch, follow instructions on the last 2 pages of Patient  Logbook. Stick patch monitor onto the last page of Patient Logbook.  Place Patient Logbook in the blue and white box. Use locking tab on box and tape box closed  securely. The blue and white box has prepaid postage on it. Please place it in the mailbox as  soon as possible. Your physician should have your test results approximately 7 days after the  monitor has been [facility] to [facility]  Call IBerkleyat [phone] you have questions regarding  your ZIO XT patch monitor. Call them immediately if you see an orange light blinking on your  monitor.  If your monitor falls off in less than 4 days, contact our Monitor department at [phone]  If your monitor becomes loose or falls off after 4 days call [employer] at  [phone]  suggestions on securing your monitor

## 2022-06-30 LAB — BASIC METABOLIC PANEL
BUN/Creatinine Ratio: 11 (ref 10–24)
BUN: 10 mg/dL (ref 8–27)
CO2: 24 mmol/L (ref 20–29)
Calcium: 9.9 mg/dL (ref 8.6–10.2)
Chloride: 103 mmol/L (ref 96–106)
Creatinine, Ser: 0.91 mg/dL (ref 0.76–1.27)
Glucose: 93 mg/dL (ref 70–99)
Potassium: 4.4 mmol/L (ref 3.5–5.2)
Sodium: 140 mmol/L (ref 134–144)
eGFR: 95 mL/min/{1.73_m2} (ref >59)

## 2022-06-30 LAB — MAGNESIUM: Magnesium: 2.2 mg/dL (ref 1.6–2.3)

## 2022-07-03 DIAGNOSIS — I4819 Other persistent atrial fibrillation: Secondary | ICD-10-CM | POA: Diagnosis not present

## 2022-07-13 ENCOUNTER — Other Ambulatory Visit (HOSPITAL_COMMUNITY): Payer: Self-pay | Admitting: Nurse Practitioner

## 2022-08-29 ENCOUNTER — Other Ambulatory Visit: Payer: Self-pay | Admitting: Internal Medicine

## 2022-09-19 ENCOUNTER — Other Ambulatory Visit (HOSPITAL_COMMUNITY): Payer: Self-pay | Admitting: *Deleted

## 2022-09-19 MED ORDER — APIXABAN 5 MG PO TABS: 5.0000 mg | ORAL_TABLET | Freq: Two times a day (BID) | ORAL | 6 refills | Status: DC

## 2022-11-02 ENCOUNTER — Ambulatory Visit (HOSPITAL_COMMUNITY)
Admission: RE | Admit: 2022-11-02 | Discharge: 2022-11-02 | Disposition: A | Discharge status: Home / Self Care | Payer: BC Managed Care – PPO | Source: Ambulatory Visit | Attending: Nurse Practitioner | Admitting: Nurse Practitioner

## 2022-11-02 ENCOUNTER — Encounter (HOSPITAL_COMMUNITY): Payer: Self-pay | Admitting: Nurse Practitioner

## 2022-11-02 VITALS — BP 110/62 | HR 54 | Ht 75.0 in | Wt 276.8 lb

## 2022-11-02 DIAGNOSIS — Z7901 Long term (current) use of anticoagulants: Secondary | ICD-10-CM | POA: Diagnosis not present

## 2022-11-02 DIAGNOSIS — I4891 Unspecified atrial fibrillation: Secondary | ICD-10-CM | POA: Diagnosis not present

## 2022-11-02 DIAGNOSIS — G4733 Obstructive sleep apnea (adult) (pediatric): Secondary | ICD-10-CM | POA: Insufficient documentation

## 2022-11-02 DIAGNOSIS — D6869 Other thrombophilia: Secondary | ICD-10-CM | POA: Diagnosis not present

## 2022-11-02 DIAGNOSIS — I4892 Unspecified atrial flutter: Secondary | ICD-10-CM | POA: Insufficient documentation

## 2022-11-02 DIAGNOSIS — Z6834 Body mass index (BMI) 34.0-34.9, adult: Secondary | ICD-10-CM | POA: Insufficient documentation

## 2022-11-02 DIAGNOSIS — Z79899 Other long term (current) drug therapy: Secondary | ICD-10-CM | POA: Diagnosis not present

## 2022-11-02 DIAGNOSIS — E669 Obesity, unspecified: Secondary | ICD-10-CM | POA: Diagnosis not present

## 2022-11-02 DIAGNOSIS — I4819 Other persistent atrial fibrillation: Secondary | ICD-10-CM | POA: Diagnosis present

## 2022-11-02 DIAGNOSIS — I10 Essential (primary) hypertension: Secondary | ICD-10-CM | POA: Diagnosis not present

## 2022-11-02 LAB — BASIC METABOLIC PANEL
Anion gap: 6 (ref 5–15)
BUN: 12 mg/dL (ref 8–23)
CO2: 27 mmol/L (ref 22–32)
Calcium: 9.4 mg/dL (ref 8.9–10.3)
Chloride: 105 mmol/L (ref 98–111)
Creatinine, Ser: 1 mg/dL (ref 0.61–1.24)
GFR, Estimated: >60 mL/min (ref >60)
Glucose, Bld: 88 mg/dL (ref 70–99)
Potassium: 4 mmol/L (ref 3.5–5.1)
Sodium: 138 mmol/L (ref 135–145)

## 2022-11-02 LAB — MAGNESIUM: Magnesium: 2.1 mg/dL (ref 1.7–2.4)
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
## 2022-12-07 ENCOUNTER — Other Ambulatory Visit (HOSPITAL_COMMUNITY): Payer: Self-pay | Admitting: Nurse Practitioner

## 2022-12-08 ENCOUNTER — Encounter: Payer: Self-pay | Admitting: Adult Health

## 2022-12-08 ENCOUNTER — Ambulatory Visit (INDEPENDENT_AMBULATORY_CARE_PROVIDER_SITE_OTHER): Payer: BC Managed Care – PPO | Admitting: Adult Health

## 2022-12-08 VITALS — BP 130/80 | HR 60 | Temp 98.0°F | Ht 75.0 in | Wt 275.0 lb

## 2022-12-08 DIAGNOSIS — M79642 Pain in left hand: Secondary | ICD-10-CM | POA: Diagnosis not present

## 2022-12-08 DIAGNOSIS — M79671 Pain in right foot: Secondary | ICD-10-CM

## 2022-12-08 DIAGNOSIS — Z23 Encounter for immunization: Secondary | ICD-10-CM | POA: Diagnosis not present

## 2022-12-08 DIAGNOSIS — M79641 Pain in right hand: Secondary | ICD-10-CM
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
## 2022-12-08 NOTE — Patient Instructions (Addendum)
I am going to refer you to hand surgery   Please follow up next week for an xray of your right foot

## 2022-12-16 ENCOUNTER — Ambulatory Visit (INDEPENDENT_AMBULATORY_CARE_PROVIDER_SITE_OTHER): Payer: BC Managed Care – PPO

## 2022-12-16 ENCOUNTER — Other Ambulatory Visit: Payer: BC Managed Care – PPO

## 2022-12-16 DIAGNOSIS — M79671 Pain in right foot: Secondary | ICD-10-CM | POA: Diagnosis not present

## 2022-12-20 ENCOUNTER — Other Ambulatory Visit: Payer: Self-pay | Admitting: Adult Health

## 2022-12-20 DIAGNOSIS — M79671 Pain in right foot: Secondary | ICD-10-CM

## 2022-12-21 ENCOUNTER — Other Ambulatory Visit (HOSPITAL_COMMUNITY): Payer: Self-pay | Admitting: Nurse Practitioner

## 2022-12-21 ENCOUNTER — Other Ambulatory Visit: Payer: Self-pay | Admitting: Adult Health

## 2022-12-30 ENCOUNTER — Ambulatory Visit (HOSPITAL_BASED_OUTPATIENT_CLINIC_OR_DEPARTMENT_OTHER)
Admission: RE | Admit: 2022-12-30 | Discharge: 2022-12-30 | Disposition: A | Discharge status: Home / Self Care | Payer: BC Managed Care – PPO | Source: Ambulatory Visit | Attending: Adult Health | Admitting: Adult Health

## 2022-12-30 DIAGNOSIS — M79671 Pain in right foot: Secondary | ICD-10-CM | POA: Diagnosis present

## 2022-12-30 DIAGNOSIS — M19071 Primary osteoarthritis, right ankle and foot: Secondary | ICD-10-CM | POA: Diagnosis not present

## 2022-12-30 DIAGNOSIS — R609 Edema, unspecified: Secondary | ICD-10-CM | POA: Insufficient documentation

## 2023-01-04 ENCOUNTER — Telehealth: Payer: Self-pay | Admitting: Adult Health

## 2023-01-04 NOTE — Telephone Encounter (Signed)
Patient returned call for imaging results

## 2023-01-04 NOTE — Telephone Encounter (Signed)
Noted  

## 2023-01-19 ENCOUNTER — Encounter (HOSPITAL_COMMUNITY): Payer: Self-pay | Admitting: *Deleted

## 2023-02-17 ENCOUNTER — Other Ambulatory Visit: Payer: Self-pay | Admitting: Adult Health

## 2023-02-17 DIAGNOSIS — E782 Mixed hyperlipidemia: Secondary | ICD-10-CM

## 2023-03-06 DIAGNOSIS — Z0279 Encounter for issue of other medical certificate: Secondary | ICD-10-CM

## 2023-03-08 ENCOUNTER — Telehealth: Payer: Self-pay | Admitting: Cardiology

## 2023-03-08 NOTE — Telephone Encounter (Signed)
[employer] DOT form and payment received. Form in Dr. Mardene Speak box.

## 2023-03-10 NOTE — Telephone Encounter (Signed)
Left message for patient to call back  

## 2023-03-10 NOTE — Telephone Encounter (Signed)
Spoke with the patient who needs DOT form filled out. Forms state that he needs echocardiogram within two years. Patient has not had an echo within two years. Last seen by Dr. Quentin Ore 06/2022. Patient due for follow up. Scheduled appointment with Oda Kilts, PA next week to evaluate patient and order echo.

## 2023-03-14 NOTE — Progress Notes (Signed)
  Electrophysiology Office Note:   Date:  03/15/2023  ID:  Pola Corn, DOB 08-24-1956, MRN [id number]  Primary Cardiologist: None Electrophysiologist: Lanier Prude, MD   History of Present Illness:   Douglas Chandler is a 67 y.o. male with h/o obesity, AFL s/p ablation 02/2018, and PAF seen today for routine electrophysiology followup. Since last being seen in our clinic the patient reports doing well from a cardiac perspective. He has brief, intermittent palpations but not bothersome. .  he denies chest pain, palpitations, dyspnea, PND, orthopnea, nausea, vomiting, dizziness, syncope, edema, weight gain, or early satiety.   Review of systems complete and found to be negative unless listed in HPI.   Studies Reviewed:    EKG is ordered today. Personal review shows NSR at 59 bpm with stable QT on tikosyn   Risk Assessment/Calculations:    CHA2DS2/VASc is at least 2   Physical Exam:   VS:  BP 124/68   Pulse (!) 59   Ht 6\' 3"  (1.905 m)   Wt 280 lb (127 kg)   SpO2 94%   BMI 35.00 kg/m    Wt Readings from Last 3 Encounters:  03/15/23 280 lb (127 kg)  12/08/22 275 lb (124.7 kg)  11/02/22 276 lb 12.8 oz (125.6 kg)    GEN: Well nourished, well developed in no acute distress NECK: No JVD; No carotid bruits CARDIAC: Regular rate and rhythm, no murmurs, rubs, gallops RESPIRATORY:  Clear to auscultation without rales, wheezing or rhonchi  ABDOMEN: Soft, non-tender, non-distended EXTREMITIES:  No edema; No deformity   ASSESSMENT AND PLAN:    Persistent atrial fibrillation EKG today shows sinus bradycardia at 59 bpm Continue tikosyn 500 mcg BID Continue Eliquis 5 mg BID Continue toprol 50 mg BID.  Have previously discussed the possibility of pursuing catheter ablation in the future should his burden of atrial fibrillation increase.   Obesity Body mass index is 35 kg/m.  Encouraged lifestyle modification   DOT clearance No current cardiac restrictions.  Will update his Echo  as required by DOT.   Follow up with Dr. Lalla Brothers in 4 months  Signed, Graciella Freer, PA-C

## 2023-03-15 ENCOUNTER — Encounter: Payer: Self-pay | Admitting: Student

## 2023-03-15 ENCOUNTER — Ambulatory Visit: Payer: BC Managed Care – PPO | Attending: Student | Admitting: Student

## 2023-03-15 VITALS — BP 124/68 | HR 59 | Ht 75.0 in | Wt 280.0 lb

## 2023-03-15 DIAGNOSIS — Z79899 Other long term (current) drug therapy: Secondary | ICD-10-CM | POA: Diagnosis not present

## 2023-03-15 DIAGNOSIS — I4891 Unspecified atrial fibrillation: Secondary | ICD-10-CM

## 2023-03-15 DIAGNOSIS — I4819 Other persistent atrial fibrillation: Secondary | ICD-10-CM

## 2023-03-15 NOTE — Patient Instructions (Signed)
Medication Instructions:  Your physician recommends that you continue on your current medications as directed. Please refer to the Current Medication list given to you today.  *If you need a refill on your cardiac medications before your next appointment, please call your pharmacy*   Lab Work: BMET, MAG--TODAY If you have labs (blood work) drawn today and your tests are completely normal, you will receive your results only by: [place] (if you have MyChart) OR A paper copy in the mail If you have any lab test that is abnormal or we need to change your treatment, we will call you to review the results.   Testing/Procedures: Your physician has requested that you have an echocardiogram. Echocardiography is a painless test that uses sound waves to create images of your heart. It provides your doctor with information about the size and shape of your heart and how well your heart's chambers and valves are working. This procedure takes approximately one hour. There are no restrictions for this procedure. Please do NOT wear cologne, perfume, aftershave, or lotions (deodorant is allowed). Please arrive 15 minutes prior to your appointment time.    Follow-Up: At [facility], you and your health needs are our priority.  As part of our continuing mission to provide you with exceptional heart care, we have created designated Provider Care Teams.  These Care Teams include your primary Cardiologist (physician) and Advanced Practice Providers (APPs -  Physician Assistants and Nurse Practitioners) who all work together to provide you with the care you need, when you need it.   Your next appointment:   4 month(s)  Provider:   Lars Mage, MD

## 2023-03-16 LAB — BASIC METABOLIC PANEL
BUN/Creatinine Ratio: 16 (ref 10–24)
BUN: 15 mg/dL (ref 8–27)
CO2: 22 mmol/L (ref 20–29)
Calcium: 10 mg/dL (ref 8.6–10.2)
Chloride: 103 mmol/L (ref 96–106)
Creatinine, Ser: 0.91 mg/dL (ref 0.76–1.27)
Glucose: 103 mg/dL — ABNORMAL HIGH (ref 70–99)
Potassium: 4.4 mmol/L (ref 3.5–5.2)
Sodium: 140 mmol/L (ref 134–144)
eGFR: 94 mL/min/{1.73_m2} (ref >59)

## 2023-03-16 LAB — MAGNESIUM: Magnesium: 1.9 mg/dL (ref 1.6–2.3)

## 2023-03-17 ENCOUNTER — Other Ambulatory Visit: Payer: Self-pay | Admitting: Adult Health

## 2023-03-17 DIAGNOSIS — E782 Mixed hyperlipidemia: Secondary | ICD-10-CM

## 2023-03-20 ENCOUNTER — Other Ambulatory Visit (HOSPITAL_COMMUNITY): Payer: Self-pay | Admitting: *Deleted

## 2023-03-20 DIAGNOSIS — I4891 Unspecified atrial fibrillation: Secondary | ICD-10-CM

## 2023-03-20 MED ORDER — APIXABAN 5 MG PO TABS: 5.0000 mg | ORAL_TABLET | Freq: Two times a day (BID) | ORAL | 6 refills | Status: DC

## 2023-03-20 MED ORDER — APIXABAN 5 MG PO TABS
5.0000 mg | ORAL_TABLET | Freq: Two times a day (BID) | ORAL | 1 refills | Status: DC
Start: 2023-03-20 — End: 2024-03-21

## 2023-03-20 NOTE — Telephone Encounter (Signed)
Prescription refill request for Eliquis received. Indication: Afib  Last office visit: 03/15/23 Lanna Poche)  Scr: 0.91 (03/15/23)  Age: 67 Weight: 127kg  Appropriate dose. Refill sent.

## 2023-03-24 ENCOUNTER — Encounter: Payer: Self-pay | Admitting: Adult Health

## 2023-03-24 ENCOUNTER — Ambulatory Visit (INDEPENDENT_AMBULATORY_CARE_PROVIDER_SITE_OTHER): Payer: BC Managed Care – PPO | Admitting: Adult Health

## 2023-03-24 ENCOUNTER — Other Ambulatory Visit: Payer: Self-pay | Admitting: Adult Health

## 2023-03-24 VITALS — BP 110/70 | HR 59 | Temp 97.8°F | Ht 73.0 in | Wt 278.0 lb

## 2023-03-24 DIAGNOSIS — I10 Essential (primary) hypertension: Secondary | ICD-10-CM

## 2023-03-24 DIAGNOSIS — L719 Rosacea, unspecified: Secondary | ICD-10-CM

## 2023-03-24 DIAGNOSIS — G4733 Obstructive sleep apnea (adult) (pediatric): Secondary | ICD-10-CM

## 2023-03-24 DIAGNOSIS — I4819 Other persistent atrial fibrillation: Secondary | ICD-10-CM | POA: Diagnosis not present

## 2023-03-24 DIAGNOSIS — I5022 Chronic systolic (congestive) heart failure: Secondary | ICD-10-CM | POA: Diagnosis not present

## 2023-03-24 DIAGNOSIS — Z0000 Encounter for general adult medical examination without abnormal findings: Secondary | ICD-10-CM

## 2023-03-24 DIAGNOSIS — E782 Mixed hyperlipidemia: Secondary | ICD-10-CM

## 2023-03-24 DIAGNOSIS — E668 Other obesity: Secondary | ICD-10-CM

## 2023-03-24 DIAGNOSIS — Z125 Encounter for screening for malignant neoplasm of prostate: Secondary | ICD-10-CM

## 2023-03-24 LAB — COMPREHENSIVE METABOLIC PANEL
ALT: 28 U/L (ref 0–53)
AST: 22 U/L (ref 0–37)
Albumin: 4.6 g/dL (ref 3.5–5.2)
Alkaline Phosphatase: 58 U/L (ref 39–117)
BUN: 15 mg/dL (ref 6–23)
CO2: 28 mEq/L (ref 19–32)
Calcium: 10 mg/dL (ref 8.4–10.5)
Chloride: 102 mEq/L (ref 96–112)
Creatinine, Ser: 0.98 mg/dL (ref 0.40–1.50)
GFR: 81.61 mL/min (ref >60.00)
Glucose, Bld: 106 mg/dL — ABNORMAL HIGH (ref 70–99)
Potassium: 4.4 mEq/L (ref 3.5–5.1)
Sodium: 139 mEq/L (ref 135–145)
Total Bilirubin: 0.9 mg/dL (ref 0.2–1.2)
Total Protein: 7.4 g/dL (ref 6.0–8.3)

## 2023-03-24 LAB — LIPID PANEL
Cholesterol: 113 mg/dL (ref 0–200)
HDL: 31.3 mg/dL — ABNORMAL LOW (ref >39.00)
LDL Cholesterol: 57 mg/dL (ref 0–99)
NonHDL: 82.06
Total CHOL/HDL Ratio: 4
Triglycerides: 125 mg/dL (ref 0.0–149.0)
VLDL: 25 mg/dL (ref 0.0–40.0)

## 2023-03-24 LAB — PSA: PSA: 0.32 ng/mL (ref 0.10–4.00)

## 2023-03-24 LAB — TSH: TSH: 1.61 u[IU]/mL (ref 0.35–5.50)

## 2023-03-24 LAB — CBC
HCT: 46.9 % (ref 39.0–52.0)
Hemoglobin: 16.2 g/dL (ref 13.0–17.0)
MCHC: 34.7 g/dL (ref 30.0–36.0)
MCV: 92.2 fl (ref 78.0–100.0)
Platelets: 198 10*3/uL (ref 150.0–400.0)
RBC: 5.08 Mil/uL (ref 4.22–5.81)
RDW: 13.2 % (ref 11.5–15.5)
WBC: 9 10*3/uL (ref 4.0–10.5)

## 2023-03-24 MED ORDER — ATORVASTATIN CALCIUM 10 MG PO TABS
ORAL_TABLET | ORAL | 3 refills | Status: DC
Start: 2023-03-24 — End: 2023-04-12

## 2023-03-24 MED ORDER — DOXYCYCLINE HYCLATE 50 MG PO CAPS
50.0000 mg | ORAL_CAPSULE | Freq: Two times a day (BID) | ORAL | 0 refills | Status: DC
Start: 2023-03-24 — End: 2023-09-19

## 2023-03-24 NOTE — Patient Instructions (Signed)
It was great seeing you today   We will follow up with you regarding your lab work   Please let me know if you need anything   
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
## 2023-04-09 ENCOUNTER — Emergency Department (HOSPITAL_BASED_OUTPATIENT_CLINIC_OR_DEPARTMENT_OTHER): Payer: BC Managed Care – PPO

## 2023-04-09 ENCOUNTER — Other Ambulatory Visit: Payer: Self-pay

## 2023-04-09 ENCOUNTER — Emergency Department (HOSPITAL_BASED_OUTPATIENT_CLINIC_OR_DEPARTMENT_OTHER)
Admission: EM | Admit: 2023-04-09 | Discharge: 2023-04-09 | Disposition: A | Discharge status: Home / Self Care | Payer: BC Managed Care – PPO | Attending: Emergency Medicine | Admitting: Emergency Medicine

## 2023-04-09 ENCOUNTER — Encounter (HOSPITAL_BASED_OUTPATIENT_CLINIC_OR_DEPARTMENT_OTHER): Payer: Self-pay

## 2023-04-09 DIAGNOSIS — Z7901 Long term (current) use of anticoagulants: Secondary | ICD-10-CM | POA: Diagnosis not present

## 2023-04-09 DIAGNOSIS — I4891 Unspecified atrial fibrillation: Secondary | ICD-10-CM | POA: Diagnosis not present

## 2023-04-09 DIAGNOSIS — R079 Chest pain, unspecified: Secondary | ICD-10-CM | POA: Diagnosis present

## 2023-04-09 LAB — CBC WITH DIFFERENTIAL/PLATELET
Abs Immature Granulocytes: 0.01 10*3/uL (ref 0.00–0.07)
Basophils Absolute: 0 10*3/uL (ref 0.0–0.1)
Basophils Relative: 1 %
Eosinophils Absolute: 0.1 10*3/uL (ref 0.0–0.5)
Eosinophils Relative: 1 %
HCT: 47.5 % (ref 39.0–52.0)
Hemoglobin: 16.6 g/dL (ref 13.0–17.0)
Immature Granulocytes: 0 %
Lymphocytes Relative: 31 %
Lymphs Abs: 2.6 10*3/uL (ref 0.7–4.0)
MCH: 31.9 pg (ref 26.0–34.0)
MCHC: 34.9 g/dL (ref 30.0–36.0)
MCV: 91.2 fL (ref 80.0–100.0)
Monocytes Absolute: 0.9 10*3/uL (ref 0.1–1.0)
Monocytes Relative: 11 %
Neutro Abs: 4.8 10*3/uL (ref 1.7–7.7)
Neutrophils Relative %: 56 %
Platelets: 207 10*3/uL (ref 150–400)
RBC: 5.21 MIL/uL (ref 4.22–5.81)
RDW: 12.8 % (ref 11.5–15.5)
WBC: 8.4 10*3/uL (ref 4.0–10.5)
nRBC: 0 % (ref 0.0–0.2)

## 2023-04-09 LAB — COMPREHENSIVE METABOLIC PANEL
ALT: 30 U/L (ref 0–44)
AST: 25 U/L (ref 15–41)
Albumin: 4.2 g/dL (ref 3.5–5.0)
Alkaline Phosphatase: 53 U/L (ref 38–126)
Anion gap: 9 (ref 5–15)
BUN: 14 mg/dL (ref 8–23)
CO2: 24 mmol/L (ref 22–32)
Calcium: 9.7 mg/dL (ref 8.9–10.3)
Chloride: 107 mmol/L (ref 98–111)
Creatinine, Ser: 0.9 mg/dL (ref 0.61–1.24)
GFR, Estimated: >60 mL/min (ref >60)
Glucose, Bld: 110 mg/dL — ABNORMAL HIGH (ref 70–99)
Potassium: 4.1 mmol/L (ref 3.5–5.1)
Sodium: 140 mmol/L (ref 135–145)
Total Bilirubin: 0.9 mg/dL (ref 0.3–1.2)
Total Protein: 6.9 g/dL (ref 6.5–8.1)

## 2023-04-09 LAB — TROPONIN I (HIGH SENSITIVITY): Troponin I (High Sensitivity): 7 ng/L (ref <18)

## 2023-04-09 LAB — LIPASE, BLOOD: Lipase: 44 U/L (ref 11–51)

## 2023-04-09 LAB — MAGNESIUM: Magnesium: 1.7 mg/dL (ref 1.7–2.4)

## 2023-04-09 MED ORDER — DILTIAZEM HCL 30 MG PO TABS
60.0000 mg | ORAL_TABLET | Freq: Once | ORAL | Status: DC
  Filled 2023-04-09: qty 2

## 2023-04-09 MED ORDER — DILTIAZEM HCL 30 MG PO TABS: 60.0000 mg | ORAL_TABLET | Freq: Once | ORAL | Status: DC

## 2023-04-09 MED ORDER — DILTIAZEM LOAD VIA INFUSION
15.0000 mg | Freq: Once | INTRAVENOUS | Status: AC
  Administered 2023-04-09: 15 mg via INTRAVENOUS
  Filled 2023-04-09: qty 15

## 2023-04-09 MED ORDER — SODIUM CHLORIDE 0.9 % IV BOLUS
1000.0000 mL | Freq: Once | INTRAVENOUS | Status: AC
  Administered 2023-04-09: 1000 mL via INTRAVENOUS

## 2023-04-09 MED ORDER — METOPROLOL SUCCINATE ER 25 MG PO TB24
50.0000 mg | ORAL_TABLET | Freq: Once | ORAL | Status: AC
  Administered 2023-04-09: 50 mg via ORAL
  Filled 2023-04-09: qty 2

## 2023-04-09 MED ORDER — DILTIAZEM HCL 60 MG PO TABS: 60.0000 mg | ORAL_TABLET | Freq: Two times a day (BID) | ORAL | 0 refills | Status: DC | PRN

## 2023-04-09 MED ORDER — DILTIAZEM HCL-DEXTROSE 125-5 MG/125ML-% IV SOLN (PREMIX)
5.0000 mg/h | INTRAVENOUS | Status: DC
  Administered 2023-04-09: 5 mg/h via INTRAVENOUS
  Filled 2023-04-09: qty 125

## 2023-04-09 MED ORDER — DILTIAZEM HCL 60 MG PO TABS: 60.0000 mg | ORAL_TABLET | Freq: Once | ORAL | 0 refills | Status: DC

## 2023-04-09 NOTE — ED Triage Notes (Signed)
In for eval of center to left sided chest pain onset last pm at approx 2200. Denis SOB, N/V. Pressure type intermittent pain. H/O Afib.

## 2023-04-09 NOTE — ED Notes (Signed)
Dc instructions reviewed with patient. Patient voiced understanding. Dc with belongings.  °

## 2023-04-09 NOTE — ED Notes (Signed)
Ambulated to bathroom with assistance. Pt states he was feeling lightheaded before he sat up in stretcher and his bp is 99/68.MD aware,monitor

## 2023-04-09 NOTE — ED Provider Notes (Signed)
[place] EMERGENCY DEPARTMENT AT [facility] Provider Note   CSN: [id number] Arrival date & time: 04/09/23  1503     History  Chief Complaint  Patient presents with   Chest Pain    Douglas Chandler is a 67 y.o. male.  Patient here with palpitations, some chest discomfort that started last night.  Feels like he is back in A-fib.  Takes Tikosyn and metoprolol.  He was doing some extra yard work and may be could be dehydrated.  Denies any shortness of breath, leg swelling, weakness, numbness, headache.  Pain is not exertional.  Denies any cough or sputum production.  Nothing makes it worse or better.  The history is provided by the patient.       Home Medications Prior to Admission medications   Medication Sig Start Date End Date Taking? Authorizing Provider  diltiazem (CARDIZEM) 60 MG tablet Take 1 tablet (60 mg total) by mouth 2 (two) times daily as needed for up to 30 doses. 04/09/23  Yes Luna Audia, DO  apixaban (ELIQUIS) 5 MG TABS tablet Take 1 tablet (5 mg total) by mouth 2 (two) times daily. 03/20/23   Lanier Prude, MD  atorvastatin (LIPITOR) 10 MG tablet TAKE 1 TABLET (10 MG TOTAL) BY MOUTH DAILY. NEED APPOINTMENT FOR FURTHER REFILLS 03/24/23   Shirline Frees, NP  Azelaic Acid 15 % gel Apply topically at bedtime. 11/10/21   [provider]  dofetilide (TIKOSYN) 500 MCG capsule TAKE 1 CAPSULE BY MOUTH TWICE A DAY 12/21/22   Newman Nip, NP  doxycycline (VIBRAMYCIN) 50 MG capsule Take 1 capsule (50 mg total) by mouth 2 (two) times daily. 03/24/23 06/22/23  Nafziger, Kandee Keen, NP  fluocinonide (LIDEX) 0.05 % external solution Apply 1 Application topically as needed. 08/10/22   [provider]  fluorometholone (FML) 0.1 % ophthalmic suspension Place 1 drop into both eyes 2 (two) times daily as needed (redness/irritation).  12/03/18   [provider]  fluticasone (FLONASE) 50 MCG/ACT nasal spray Place 2 sprays into both nostrils daily as needed  for allergies. 03/30/22   Nafziger, Kandee Keen, NP  hydrocortisone 2.5 % cream Apply 1 application topically as needed. 06/17/21   [provider]  loratadine (CLARITIN) 10 MG tablet Take 10 mg by mouth as needed for allergies or rhinitis.    [provider]  magnesium oxide (MAG-OX) 400 (240 Mg) MG tablet TAKE 1 TABLET BY MOUTH 2 TIMES DAILY 07/13/22   Newman Nip, NP  metoprolol succinate (TOPROL-XL) 50 MG 24 hr tablet TAKE 1 TABLET BY MOUTH IN THE MORNING AND AT BEDTIME. TAKE WITH OR IMMEDIATELY FOLLOWING A MEAL. 12/07/22   Newman Nip, NP  metroNIDAZOLE (METROGEL) 0.75 % gel Apply topically every morning. 11/10/21   [provider]  Multiple Vitamin (MULTIVITAMIN WITH MINERALS) TABS tablet Take 1 tablet by mouth daily. Multivitamin for Adults 50+    [provider]  sacubitril-valsartan (ENTRESTO) 24-26 MG Take 1 tablet by mouth twice daily 08/30/22   Lanier Prude, MD      Allergies    Meloxicam    Review of Systems   Review of Systems  Physical Exam Updated Vital Signs BP (!) 91/56   Pulse 80   Temp 98.4 F (36.9 C) (Oral)   Resp 17   Ht 6\' 3"  (1.905 m)   Wt 126.1 kg   SpO2 97%   BMI 34.75 kg/m  Physical Exam Vitals and nursing note reviewed.  Constitutional:  General: He is not in acute distress.    Appearance: He is well-developed. He is not ill-appearing.  HENT:     Head: Normocephalic and atraumatic.  Eyes:     Extraocular Movements: Extraocular movements intact.     Conjunctiva/sclera: Conjunctivae normal.     Pupils: Pupils are equal, round, and reactive to light.  Cardiovascular:     Rate and Rhythm: Tachycardia present. Rhythm irregular.     Pulses:          Radial pulses are 2+ on the right side and 2+ on the left side.     Heart sounds: Normal heart sounds. No murmur heard. Pulmonary:     Effort: Pulmonary effort is normal. No respiratory distress.     Breath sounds: Normal breath sounds. No decreased breath  sounds, wheezing or rhonchi.  Abdominal:     Palpations: Abdomen is soft.     Tenderness: There is no abdominal tenderness.  Musculoskeletal:        General: No swelling. Normal range of motion.     Cervical back: Normal range of motion and neck supple.     Right lower leg: No edema.     Left lower leg: No edema.  Skin:    General: Skin is warm and dry.     Capillary Refill: Capillary refill takes less than 2 seconds.  Neurological:     General: No focal deficit present.     Mental Status: He is alert.  Psychiatric:        Mood and Affect: Mood normal.     ED Results / Procedures / Treatments   Labs (all labs ordered are listed, but only abnormal results are displayed) Labs Reviewed  COMPREHENSIVE METABOLIC PANEL - Abnormal; Notable for the following components:      Result Value   Glucose, Bld 110 (*)    All other components within normal limits  CBC WITH DIFFERENTIAL/PLATELET  LIPASE, BLOOD  MAGNESIUM  TROPONIN I (HIGH SENSITIVITY)    EKG EKG Interpretation  Date/Time:  Sunday April 09 2023 15:23:45 EDT Ventricular Rate:  135 PR Interval:    QRS Duration: 92 QT Interval:  320 QTC Calculation: 480 R Axis:   31 Text Interpretation: Atrial flutter with variable A-V block with premature ventricular or aberrantly conducted complexes Possible Anterior infarct , age undetermined Abnormal ECG When compared with ECG of 02-Nov-2022 10:02, Significant changes have occurred Confirmed by Virgina Norfolk ([id number]) on 04/09/2023 3:26:50 PM  Radiology DG Chest Portable 1 View  Result Date: 04/09/2023 CLINICAL DATA:  Chest pain EXAM: PORTABLE CHEST - 1 VIEW COMPARISON:  04/12/2020 FINDINGS: Minimally or scarring or atelectasis at the left lung base. Lungs otherwise clear. Heart size and mediastinal contours are within normal limits. Carotid calcifications. No effusion. Visualized bones unremarkable. IMPRESSION: No acute cardiopulmonary disease. Electronically Signed   By: Corlis Leak M.D.    On: 04/09/2023 16:22    Procedures Procedures    Medications Ordered in ED Medications  diltiazem (CARDIZEM) 1 mg/mL load via infusion 15 mg (15 mg Intravenous Bolus from Bag 04/09/23 1623)    And  diltiazem (CARDIZEM) 125 mg in dextrose 5% 125 mL (1 mg/mL) infusion (0 mg/hr Intravenous Stopped 04/09/23 1723)  sodium chloride 0.9 % bolus 1,000 mL (1,000 mLs Intravenous New Bag/Given 04/09/23 1623)  metoprolol succinate (TOPROL-XL) 24 hr tablet 50 mg (50 mg Oral Given 04/09/23 1643)    ED Course/ Medical Decision Making/ A&P  Medical Decision Making Amount and/or Complexity of Data Reviewed Labs: ordered. Radiology: ordered.  Risk Prescription drug management.   Douglas Chandler is here with chest pain and palpitations.  He is in A-fib with RVR on EKG.  Vital signs otherwise unremarkable.  Differential diagnosis likely symptomatic A-fib with RVR.  Discussed cardioversion as he has a history of this and is on Eliquis and Tikosyn and metoprolol.  However he would like to try to avoid that at this time.  He started on IV diltiazem bolus and infusion and heart rate stabilized in the 80s.  Talked with Dr. Eden Emms with cardiology and ultimately decided that we can give him Cardizem p.o. 60 mg twice daily as needed for heart rates above 125/130.  He will continue on his home dose of Tikosyn and metoprolol.  I stopped the IV diltiazem drip and gave him his home dose of metoprolol.  Heart rate stayed stable in the 70s and 80s.  Blood pressure in the low 100 systolic.  Continues to be in A-fib rhythm but rate is now controlled.  Cardizem prescription prescribed.  Medication educated about.  He has normal lab work.  No significant anemia or electrolyte abnormality.  Troponin is normal.  Chest x-ray showed no signs of volume overload or pneumonia.  Overall he will follow-up with his cardiologist.  Return if symptoms worsen.  This chart was dictated using voice recognition  software.  Despite best efforts to proofread,  errors can occur which can change the documentation meaning.         Final Clinical Impression(s) / ED Diagnoses Final diagnoses:  Atrial fibrillation with RVR (HCC)    Rx / DC Orders ED Discharge Orders          Ordered    diltiazem (CARDIZEM) 60 MG tablet   Once,   Status:  Discontinued        04/09/23 1725    diltiazem (CARDIZEM) 60 MG tablet  2 times daily PRN        04/09/23 1818              Virgina Norfolk, DO 04/09/23 1822

## 2023-04-09 NOTE — Discharge Instructions (Addendum)
Overall take your Tikosyn dose tonight.  Do not take your metoprolol dose tonight as you have already been given that.  If you notice that your heart rate is persistently above 125-130 then you can take a dose of Cardizem 60 mg up to twice a day.  If heart rate does not stabilize and is continuing to be above that heart rate please consider return for evaluation.  Follow-up with your cardiologist.

## 2023-04-10 ENCOUNTER — Telehealth: Payer: Self-pay | Admitting: Cardiology

## 2023-04-10 NOTE — Telephone Encounter (Signed)
Pt c/o medication issue:  1. Name of Medication: diltiazem (CARDIZEM) 60 MG tablet   2. How are you currently taking this medication (dosage and times per day)? As prescribed   3. Are you having a reaction (difficulty breathing--STAT)? Sluggish   4. What is your medication issue? Patient states that this medication was given at hospital and there has been med changes. He would like to discuss this and go over how he should continue taking this medication.

## 2023-04-10 NOTE — Telephone Encounter (Signed)
Spoke with the patient who states that he went into AFIB yesterday with elevated HR and went to the ER. They had him on IV diltiazem which helped bring his HR down. He declined a cardioversion. He was sent home on Cardizem 60 mg to take twice daily as needed for elevated heart rates. He states that today his heart rate has been in the 80s-100. He has been feeling somewhat fatigued and dizzy at times. He is going to take it easy for the rest of the day and make sure he is staying hydrated. Advised he can use the Cardizem as prescribed from the ER. He has a follow scheduled with Luster Landsberg in a couple of weeks. He will call us back if symptoms do not improve or worsen.

## 2023-04-11 ENCOUNTER — Encounter: Payer: Self-pay | Admitting: Adult Health

## 2023-04-11 DIAGNOSIS — E782 Mixed hyperlipidemia: Secondary | ICD-10-CM

## 2023-04-12 ENCOUNTER — Ambulatory Visit (HOSPITAL_COMMUNITY): Payer: BC Managed Care – PPO | Attending: Student

## 2023-04-12 DIAGNOSIS — I4819 Other persistent atrial fibrillation: Secondary | ICD-10-CM | POA: Diagnosis present

## 2023-04-12 DIAGNOSIS — I4891 Unspecified atrial fibrillation: Secondary | ICD-10-CM | POA: Diagnosis present

## 2023-04-12 LAB — ECHOCARDIOGRAM COMPLETE
Area-P 1/2: 2.97 cm2
S' Lateral: 3.5 cm

## 2023-04-12 MED ORDER — PERFLUTREN LIPID MICROSPHERE
1.0000 mL | INTRAVENOUS | Status: AC | PRN
Start: 2023-04-12 — End: 2023-04-12
  Administered 2023-04-12: 2 mL via INTRAVENOUS

## 2023-04-12 MED ORDER — ATORVASTATIN CALCIUM 10 MG PO TABS
ORAL_TABLET | ORAL | 3 refills | Status: DC
Start: 2023-04-12 — End: 2024-05-14

## 2023-04-12 NOTE — Addendum Note (Signed)
Addended by: Waymon Amato R on: 04/12/2023 08:33 AM   Modules accepted: Orders

## 2023-04-28 ENCOUNTER — Encounter: Payer: Self-pay | Admitting: Adult Health

## 2023-04-28 NOTE — Telephone Encounter (Signed)
Please advise 
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
## 2023-05-03 ENCOUNTER — Encounter: Payer: Self-pay | Admitting: Physician Assistant

## 2023-05-03 ENCOUNTER — Ambulatory Visit: Payer: BC Managed Care – PPO | Attending: Physician Assistant | Admitting: Physician Assistant

## 2023-05-03 VITALS — BP 110/60 | HR 65 | Ht 75.0 in | Wt 275.0 lb

## 2023-05-03 DIAGNOSIS — I428 Other cardiomyopathies: Secondary | ICD-10-CM

## 2023-05-03 DIAGNOSIS — Z5181 Encounter for therapeutic drug level monitoring: Secondary | ICD-10-CM

## 2023-05-03 DIAGNOSIS — I480 Paroxysmal atrial fibrillation: Secondary | ICD-10-CM | POA: Diagnosis not present

## 2023-05-03 DIAGNOSIS — D6869 Other thrombophilia: Secondary | ICD-10-CM

## 2023-05-03 DIAGNOSIS — I10 Essential (primary) hypertension: Secondary | ICD-10-CM

## 2023-05-03 DIAGNOSIS — Z79899 Other long term (current) drug therapy: Secondary | ICD-10-CM | POA: Diagnosis not present

## 2023-05-03 NOTE — Patient Instructions (Addendum)
Medication Instructions:   Your physician recommends that you continue on your current medications as directed. Please refer to the Current Medication list given to you today.  *If you need a refill on your cardiac medications before your next appointment, please call your pharmacy*   Lab Work: BMET AND MAG     If you have labs (blood work) drawn today and your tests are completely normal, you will receive your results only by: MyChart Message (if you have MyChart) OR A paper copy in the mail If you have any lab test that is abnormal or we need to change your treatment, we will call you to review the results.   Testing/Procedures: NONE ORDERED  TODAY    Follow-Up: At [facility], you and your health needs are our priority.  As part of our continuing mission to provide you with exceptional heart care, we have created designated Provider Care Teams.  These Care Teams include your primary Cardiologist (physician) and Advanced Practice Providers (APPs -  Physician Assistants and Nurse Practitioners) who all work together to provide you with the care you need, when you need it.  We recommend signing up for the patient portal called "MyChart".  Sign up information is provided on this After Visit Summary.  MyChart is used to connect with patients for Virtual Visits (Telemedicine).  Patients are able to view lab/test results, encounter notes, upcoming appointments, etc.  Non-urgent messages can be sent to your provider as well.   To learn more about what you can do with MyChart, go to [email or web address].    Your next appointment:    4 month(s)  Provider:   Steffanie Dunn, MD    Other Instructions

## 2023-05-04 LAB — BASIC METABOLIC PANEL
BUN/Creatinine Ratio: 14 (ref 10–24)
BUN: 14 mg/dL (ref 8–27)
CO2: 24 mmol/L (ref 20–29)
Calcium: 9.6 mg/dL (ref 8.6–10.2)
Chloride: 102 mmol/L (ref 96–106)
Creatinine, Ser: 0.97 mg/dL (ref 0.76–1.27)
Glucose: 92 mg/dL (ref 70–99)
Potassium: 4.7 mmol/L (ref 3.5–5.2)
Sodium: 139 mmol/L (ref 134–144)
eGFR: 87 mL/min/{1.73_m2} (ref >59)

## 2023-05-04 LAB — MAGNESIUM: Magnesium: 2 mg/dL (ref 1.6–2.3)

## 2023-06-05 ENCOUNTER — Other Ambulatory Visit: Payer: Self-pay

## 2023-06-05 MED ORDER — METOPROLOL SUCCINATE ER 50 MG PO TB24: ORAL_TABLET | ORAL | 3 refills | Status: DC

## 2023-07-17 ENCOUNTER — Other Ambulatory Visit: Payer: Self-pay | Admitting: Adult Health

## 2023-07-19 ENCOUNTER — Other Ambulatory Visit: Payer: Self-pay | Admitting: *Deleted

## 2023-07-19 MED ORDER — DOFETILIDE 500 MCG PO CAPS: 500.0000 ug | ORAL_CAPSULE | Freq: Two times a day (BID) | ORAL | 2 refills | Status: DC

## 2023-07-26 ENCOUNTER — Ambulatory Visit: Payer: BC Managed Care – PPO | Admitting: Cardiology

## 2023-07-30 ENCOUNTER — Other Ambulatory Visit: Payer: Self-pay

## 2023-07-30 ENCOUNTER — Emergency Department (HOSPITAL_BASED_OUTPATIENT_CLINIC_OR_DEPARTMENT_OTHER): Payer: BC Managed Care – PPO

## 2023-07-30 ENCOUNTER — Emergency Department (HOSPITAL_BASED_OUTPATIENT_CLINIC_OR_DEPARTMENT_OTHER)
Admission: EM | Admit: 2023-07-30 | Discharge: 2023-07-30 | Disposition: A | Discharge status: Home / Self Care | Payer: BC Managed Care – PPO | Attending: Emergency Medicine | Admitting: Emergency Medicine

## 2023-07-30 ENCOUNTER — Ambulatory Visit (INDEPENDENT_AMBULATORY_CARE_PROVIDER_SITE_OTHER)
Admission: EM | Admit: 2023-07-30 | Discharge: 2023-07-30 | Disposition: A | Discharge status: Other Acute Inpatient Hospital | Payer: BC Managed Care – PPO | Source: Home / Self Care

## 2023-07-30 ENCOUNTER — Encounter (HOSPITAL_BASED_OUTPATIENT_CLINIC_OR_DEPARTMENT_OTHER): Payer: Self-pay

## 2023-07-30 DIAGNOSIS — Z7901 Long term (current) use of anticoagulants: Secondary | ICD-10-CM | POA: Diagnosis not present

## 2023-07-30 DIAGNOSIS — Z20822 Contact with and (suspected) exposure to covid-19: Secondary | ICD-10-CM | POA: Diagnosis not present

## 2023-07-30 DIAGNOSIS — R197 Diarrhea, unspecified: Secondary | ICD-10-CM | POA: Diagnosis not present

## 2023-07-30 DIAGNOSIS — Z79899 Other long term (current) drug therapy: Secondary | ICD-10-CM | POA: Diagnosis not present

## 2023-07-30 DIAGNOSIS — I4819 Other persistent atrial fibrillation: Secondary | ICD-10-CM | POA: Diagnosis not present

## 2023-07-30 DIAGNOSIS — I5022 Chronic systolic (congestive) heart failure: Secondary | ICD-10-CM | POA: Insufficient documentation

## 2023-07-30 DIAGNOSIS — R509 Fever, unspecified: Secondary | ICD-10-CM | POA: Insufficient documentation

## 2023-07-30 DIAGNOSIS — R112 Nausea with vomiting, unspecified: Secondary | ICD-10-CM | POA: Diagnosis present

## 2023-07-30 DIAGNOSIS — R531 Weakness: Secondary | ICD-10-CM | POA: Insufficient documentation

## 2023-07-30 DIAGNOSIS — D72829 Elevated white blood cell count, unspecified: Secondary | ICD-10-CM

## 2023-07-30 DIAGNOSIS — I110 Hypertensive heart disease with heart failure: Secondary | ICD-10-CM | POA: Diagnosis not present

## 2023-07-30 DIAGNOSIS — Z1152 Encounter for screening for COVID-19: Secondary | ICD-10-CM | POA: Insufficient documentation

## 2023-07-30 LAB — CBC WITH DIFFERENTIAL/PLATELET
Abs Immature Granulocytes: 0.09 10*3/uL — ABNORMAL HIGH (ref 0.00–0.07)
Abs Immature Granulocytes: 0.07 10*3/uL (ref 0.00–0.07)
Basophils Absolute: 0.1 10*3/uL (ref 0.0–0.1)
Basophils Absolute: 0.1 10*3/uL (ref 0.0–0.1)
Basophils Relative: 0 %
Basophils Relative: 0 %
Eosinophils Absolute: 0 10*3/uL (ref 0.0–0.5)
Eosinophils Absolute: 0 10*3/uL (ref 0.0–0.5)
Eosinophils Relative: 0 %
Eosinophils Relative: 0 %
HCT: 44.5 % (ref 39.0–52.0)
HCT: 45.6 % (ref 39.0–52.0)
Hemoglobin: 15.6 g/dL (ref 13.0–17.0)
Hemoglobin: 16 g/dL (ref 13.0–17.0)
Immature Granulocytes: 0 %
Immature Granulocytes: 0 %
Lymphocytes Relative: 1 %
Lymphocytes Relative: 1 %
Lymphs Abs: 0.2 10*3/uL — ABNORMAL LOW (ref 0.7–4.0)
Lymphs Abs: 0.3 10*3/uL — ABNORMAL LOW (ref 0.7–4.0)
MCH: 31.5 pg (ref 26.0–34.0)
MCH: 31.5 pg (ref 26.0–34.0)
MCHC: 35.1 g/dL (ref 30.0–36.0)
MCHC: 35.1 g/dL (ref 30.0–36.0)
MCV: 89.7 fL (ref 80.0–100.0)
MCV: 89.8 fL (ref 80.0–100.0)
Monocytes Absolute: 1.1 10*3/uL — ABNORMAL HIGH (ref 0.1–1.0)
Monocytes Absolute: 0.5 10*3/uL (ref 0.1–1.0)
Monocytes Relative: 5 %
Monocytes Relative: 3 %
Neutro Abs: 18.9 10*3/uL — ABNORMAL HIGH (ref 1.7–7.7)
Neutro Abs: 18.4 10*3/uL — ABNORMAL HIGH (ref 1.7–7.7)
Neutrophils Relative %: 94 %
Neutrophils Relative %: 96 %
Platelets: 144 10*3/uL — ABNORMAL LOW (ref 150–400)
Platelets: 135 10*3/uL — ABNORMAL LOW (ref 150–400)
RBC: 4.96 MIL/uL (ref 4.22–5.81)
RBC: 5.08 MIL/uL (ref 4.22–5.81)
RDW: 12.7 % (ref 11.5–15.5)
RDW: 12.9 % (ref 11.5–15.5)
WBC: 20.3 10*3/uL — ABNORMAL HIGH (ref 4.0–10.5)
WBC: 19.2 10*3/uL — ABNORMAL HIGH (ref 4.0–10.5)
nRBC: 0 % (ref 0.0–0.2)
nRBC: 0 % (ref 0.0–0.2)

## 2023-07-30 LAB — URINALYSIS, ROUTINE W REFLEX MICROSCOPIC
Bilirubin Urine: NEGATIVE
Glucose, UA: NEGATIVE mg/dL
Hgb urine dipstick: NEGATIVE
Ketones, ur: NEGATIVE mg/dL
Leukocytes,Ua: NEGATIVE
Nitrite: NEGATIVE
Protein, ur: 30 mg/dL — AB
Specific Gravity, Urine: 1.025 (ref 1.005–1.030)
pH: 5.5 (ref 5.0–8.0)

## 2023-07-30 LAB — URINALYSIS, MICROSCOPIC (REFLEX)

## 2023-07-30 LAB — COMPREHENSIVE METABOLIC PANEL
ALT: 32 U/L (ref 0–44)
AST: 30 U/L (ref 15–41)
Albumin: 4.3 g/dL (ref 3.5–5.0)
Alkaline Phosphatase: 54 U/L (ref 38–126)
Anion gap: 10 (ref 5–15)
BUN: 18 mg/dL (ref 8–23)
CO2: 21 mmol/L — ABNORMAL LOW (ref 22–32)
Calcium: 9.4 mg/dL (ref 8.9–10.3)
Chloride: 105 mmol/L (ref 98–111)
Creatinine, Ser: 1.03 mg/dL (ref 0.61–1.24)
GFR, Estimated: >60 mL/min (ref >60)
Glucose, Bld: 120 mg/dL — ABNORMAL HIGH (ref 70–99)
Potassium: 3.5 mmol/L (ref 3.5–5.1)
Sodium: 136 mmol/L (ref 135–145)
Total Bilirubin: 1.1 mg/dL (ref 0.3–1.2)
Total Protein: 7.5 g/dL (ref 6.5–8.1)

## 2023-07-30 LAB — RESP PANEL BY RT-PCR (RSV, FLU A&B, COVID)  RVPGX2
Influenza A by PCR: NEGATIVE
Influenza B by PCR: NEGATIVE
Resp Syncytial Virus by PCR: NEGATIVE
SARS Coronavirus 2 by RT PCR: NEGATIVE

## 2023-07-30 LAB — LIPASE, BLOOD: Lipase: 46 U/L (ref 11–51)

## 2023-07-30 LAB — TROPONIN I (HIGH SENSITIVITY)
Troponin I (High Sensitivity): 5 ng/L (ref <18)
Troponin I (High Sensitivity): 4 ng/L (ref <18)

## 2023-07-30 MED ORDER — SODIUM CHLORIDE 0.9 % IV BOLUS
1000.0000 mL | Freq: Once | INTRAVENOUS | Status: AC
  Administered 2023-07-30: 1000 mL via INTRAVENOUS

## 2023-07-30 MED ORDER — ONDANSETRON 4 MG PO TBDP
4.0000 mg | ORAL_TABLET | Freq: Once | ORAL | Status: AC
  Administered 2023-07-30: 4 mg via ORAL

## 2023-07-30 MED ORDER — ONDANSETRON 4 MG PO TBDP: 4.0000 mg | ORAL_TABLET | Freq: Three times a day (TID) | ORAL | 0 refills | Status: DC | PRN

## 2023-07-30 MED ORDER — ACETAMINOPHEN 325 MG PO TABS
650.0000 mg | ORAL_TABLET | Freq: Once | ORAL | Status: AC
  Administered 2023-07-30: 650 mg via ORAL

## 2023-07-30 MED ORDER — IOHEXOL 300 MG/ML  SOLN
100.0000 mL | Freq: Once | INTRAMUSCULAR | Status: AC | PRN
  Administered 2023-07-30: 100 mL via INTRAVENOUS

## 2023-07-30 MED ORDER — ONDANSETRON HCL 4 MG/2ML IJ SOLN
4.0000 mg | Freq: Once | INTRAMUSCULAR | Status: AC
  Administered 2023-07-30: 4 mg via INTRAVENOUS
  Filled 2023-07-30: qty 2
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
## 2023-07-30 NOTE — ED Triage Notes (Signed)
Pt states "I feel weird". Reports nausea, CP, shakiness, weakness. N/V/D.
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
## 2023-07-30 NOTE — Discharge Instructions (Addendum)
We evaluated you for your nausea, vomiting, diarrhea and chills.  Your testing was overall reassuring although your white blood cell count was high.  We did not find any signs of any specific dangerous infection on your testing including urine test, chest x-ray, and abdominal CT scan.  To be as safe as possible, we obtained a set of blood cultures, if these return positive we will call you back to the emergency department.  I think the most likely cause of your symptoms is a viral or bacterial gastroenteritis.  These typically get better on their own and do not need any specific antibacterial or antiviral treatment.  I have prescribed you nausea medicine which you should take at home for any nausea.  Please be sure to drink lots of fluids.  Please take 1000 mg of Tylenol every 6 hours as needed for fevers.  Please keep a close eye in your symptoms.  If you do have any new or worsening symptoms such as uncontrolled fever, lightheadedness or dizziness, fainting, severe pain, uncontrolled vomiting, severe headaches, confusion, weakness, or any other new or worsening symptoms, please come back to the emergency department so that we can reassess your symptoms and obtain repeat testing.

## 2023-07-30 NOTE — ED Notes (Signed)
States "I just feel different". No further explanation (during repeat vitals).

## 2023-07-30 NOTE — ED Notes (Signed)
Patient is being discharged from the Urgent Care and sent to the Emergency Department via POV . Per JW, patient is in need of higher level of care due to hypotension/fever. Patient is aware and verbalizes understanding of plan of care.  Vitals:   07/30/23 1415 07/30/23 1507  BP:  (!) 84/47  Pulse:  86  Resp:  20  Temp:  99.2 F (37.3 C)  SpO2: 94%

## 2023-07-30 NOTE — ED Triage Notes (Signed)
"  I have been aching this weekend, during church nausea and chills". "X1 vomiting episode today, covid19 test yesterday and was negative". "Weak now".  No cough. No runny nose.

## 2023-07-31 ENCOUNTER — Ambulatory Visit: Payer: BC Managed Care – PPO | Admitting: Cardiology

## 2023-07-31 LAB — SARS CORONAVIRUS 2 (TAT 6-24 HRS): SARS Coronavirus 2: NEGATIVE

## 2023-08-01 ENCOUNTER — Encounter: Payer: Self-pay | Admitting: Adult Health

## 2023-08-01 ENCOUNTER — Ambulatory Visit (INDEPENDENT_AMBULATORY_CARE_PROVIDER_SITE_OTHER): Payer: BC Managed Care – PPO | Admitting: Adult Health

## 2023-08-01 VITALS — BP 110/68 | HR 62 | Temp 98.0°F | Ht 75.0 in | Wt 273.0 lb

## 2023-08-01 DIAGNOSIS — D72829 Elevated white blood cell count, unspecified: Secondary | ICD-10-CM | POA: Diagnosis not present

## 2023-08-01 DIAGNOSIS — K529 Noninfective gastroenteritis and colitis, unspecified: Secondary | ICD-10-CM
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
## 2023-08-02 LAB — CBC WITH DIFFERENTIAL/PLATELET
Basophils Absolute: 0.1 10*3/uL (ref 0.0–0.1)
Basophils Relative: 1 % (ref 0.0–3.0)
Eosinophils Absolute: 0.1 10*3/uL (ref 0.0–0.7)
Eosinophils Relative: 1.6 % (ref 0.0–5.0)
HCT: 45 % (ref 39.0–52.0)
Hemoglobin: 15.1 g/dL (ref 13.0–17.0)
Lymphocytes Relative: 22.8 % (ref 12.0–46.0)
Lymphs Abs: 1.2 10*3/uL (ref 0.7–4.0)
MCHC: 33.6 g/dL (ref 30.0–36.0)
MCV: 92.5 fl (ref 78.0–100.0)
Monocytes Absolute: 0.6 10*3/uL (ref 0.1–1.0)
Monocytes Relative: 12.7 % — ABNORMAL HIGH (ref 3.0–12.0)
Neutro Abs: 3.2 10*3/uL (ref 1.4–7.7)
Neutrophils Relative %: 61.9 % (ref 43.0–77.0)
Platelets: 144 10*3/uL — ABNORMAL LOW (ref 150.0–400.0)
RBC: 4.87 Mil/uL (ref 4.22–5.81)
RDW: 13.5 % (ref 11.5–15.5)
WBC: 5.1 10*3/uL (ref 4.0–10.5)

## 2023-08-04 ENCOUNTER — Encounter: Payer: Self-pay | Admitting: Adult Health

## 2023-08-05 LAB — CULTURE, BLOOD (ROUTINE X 2)
Culture: NO GROWTH
Culture: NO GROWTH
Special Requests: ADEQUATE
Special Requests: ADEQUATE

## 2023-08-08 ENCOUNTER — Encounter: Payer: Self-pay | Admitting: Adult Health

## 2023-08-16 NOTE — Progress Notes (Unsigned)
  Electrophysiology Office Follow up Visit Note:    Date:  08/17/2023   ID:  Douglas Chandler, Douglas Chandler, Douglas Chandler  PCP:  Shirline Frees, NP  [facility] Cardiologist:  None  [facility] Electrophysiologist:  Douglas Prude, MD    Interval History:    Douglas Chandler is a 67 y.o. male who presents for a follow up visit.   I last saw the patient June 29, 2022 for history of atrial flutter post ablation in March 2019.  He subsequently developed atrial fibrillation diagnosed in February 2021.  He is on Eliquis.  He takes Tikosyn for rhythm control.  The patient last saw Renee in May 2024.  He had been in the emergency department in April 2024 for atrial flutter with variable conduction and chest pain.  The patient was offered cardioversion but he declined.  He thinks he was out of rhythm for several days.     Past medical, surgical, social and family history were reviewed.  ROS:   Please see the history of present illness.    All other systems reviewed and are negative.  EKGs/Labs/Other Studies Reviewed:    The following studies were reviewed today:  Apr 12, 2023 echo EF 55-60 RV function mildly reduced Left atrium mildly dilated Trivial MR   EKG Interpretation Date/Time:  Thursday August 17 2023 08:23:06 EDT Ventricular Rate:  55 PR Interval:  170 QRS Duration:  94 QT Interval:  458 QTC Calculation: 438 R Axis:   39  Text Interpretation: Sinus bradycardia Confirmed by Douglas Chandler [phone]) on 08/17/2023 8:33:58 AM    Physical Exam:    VS:  BP 124/62   Pulse (!) 55   Ht 6\' 3"  (1.905 m)   Wt 273 lb 3.2 oz (123.9 kg)   SpO2 97%   BMI 34.15 kg/m     Wt Readings from Last 3 Encounters:  08/17/23 273 lb 3.2 oz (123.9 kg)  08/01/23 273 lb (123.8 kg)  07/30/23 268 lb (121.6 kg)     GEN:  Well nourished, well developed in no acute distress CARDIAC: RRR, no murmurs, rubs, gallops RESPIRATORY:  Clear to auscultation without rales, wheezing or  rhonchi       ASSESSMENT:    1. Encounter for long-term (current) use of high-risk medication   2. Primary hypertension   3. Persistent atrial fibrillation (HCC)    PLAN:    In order of problems listed above:  #Sustained atrial fibrillation flutter #High risk drug monitoring-Tikosyn Symptomatic Continue Eliquis Continue Tikosyn. QTc and August creatinine acceptable for ongoing Tikosyn use  #Hypertension At goal today.  Recommend checking blood pressures 1-2 times per week at home and recording the values.  Recommend bringing these recordings to the primary care physician.   Follow-up 4 months with APP.  Repeat blood work at that appointment.   Signed, Douglas Dunn, MD, [facility], [facility] 08/17/2023 8:34 AM    Electrophysiology [facility]

## 2023-08-17 ENCOUNTER — Encounter: Payer: Self-pay | Admitting: Cardiology

## 2023-08-17 ENCOUNTER — Ambulatory Visit: Payer: BC Managed Care – PPO | Attending: Cardiology | Admitting: Cardiology

## 2023-08-17 VITALS — BP 124/62 | HR 55 | Ht 75.0 in | Wt 273.2 lb

## 2023-08-17 DIAGNOSIS — I10 Essential (primary) hypertension: Secondary | ICD-10-CM

## 2023-08-17 DIAGNOSIS — I4819 Other persistent atrial fibrillation: Secondary | ICD-10-CM

## 2023-08-17 DIAGNOSIS — Z79899 Other long term (current) drug therapy: Secondary | ICD-10-CM | POA: Diagnosis not present

## 2023-08-17 NOTE — Patient Instructions (Signed)
 Medication Instructions:  Your physician recommends that you continue on your current medications as directed. Please refer to the Current Medication list given to you today.  *If you need a refill on your cardiac medications before your next appointment, please call your pharmacy*  Follow-Up: At [facility], you and your health needs are our priority.  As part of our continuing mission to provide you with exceptional heart care, we have created designated Provider Care Teams.  These Care Teams include your primary Cardiologist (physician) and Advanced Practice Providers (APPs -  Physician Assistants and Nurse Practitioners) who all work together to provide you with the care you need, when you need it.  Your next appointment:   4 months  Provider:   You will see one of the following Advanced Practice Providers on your designated Care Team:   Francis Dowse, Charlott Holler [address]" [place], [place] Sherie Don, NP Canary Brim, NP

## 2023-08-27 ENCOUNTER — Other Ambulatory Visit: Payer: Self-pay | Admitting: Cardiology

## 2023-08-28 MED ORDER — ENTRESTO 24-26 MG PO TABS: 1.0000 | ORAL_TABLET | Freq: Two times a day (BID) | ORAL | 3 refills | Status: DC

## 2023-09-19 ENCOUNTER — Other Ambulatory Visit: Payer: Self-pay | Admitting: Adult Health

## 2023-09-19 ENCOUNTER — Encounter: Payer: Self-pay | Admitting: Adult Health

## 2023-09-19 DIAGNOSIS — L719 Rosacea, unspecified: Secondary | ICD-10-CM

## 2023-09-19 MED ORDER — DOXYCYCLINE HYCLATE 50 MG PO CAPS: 50.0000 mg | ORAL_CAPSULE | Freq: Two times a day (BID) | ORAL | 0 refills | Status: DC

## 2023-10-15 ENCOUNTER — Other Ambulatory Visit: Payer: Self-pay | Admitting: Cardiology

## 2023-10-16 ENCOUNTER — Telehealth: Payer: Self-pay | Admitting: Cardiology

## 2023-10-16 NOTE — Telephone Encounter (Signed)
Paper Work Dropped Off: Surgical clearance by patient  Date: 10/16/2023  Location of paper:  faxed to pre op team . Left in folder by fax at [address]

## 2023-10-17 NOTE — Telephone Encounter (Signed)
I have received clearance request from the DDS office. However, I will need to confirm the procedure to be done as we cannot provide a blanket type clearance. All boxes are checked off on the clearance request.

## 2023-10-17 NOTE — Telephone Encounter (Signed)
Attempted to contact [facility] to receive clarification as to which dental procedure will be done first. LVM for a call back.

## 2023-10-17 NOTE — Telephone Encounter (Signed)
I will fax notes back to the DDS office that we will need to confirm procedure to be done at this time. Our office cannot provide blanket type clearance. Please fax new request with the only the procedure to be done at this time. For example if cleaning to be done 1st, please inform if simple or deep cleaning. If teeth to be extracted then we will need to know how many teeth for extraction, are they are simple extractions or surgical.   Please fax new request to [phone] attn: pre op team. We will wait for your new fax to be received.

## 2023-10-18 NOTE — Telephone Encounter (Signed)
I s/w the dental office and confirmed procedure to be done.       Pre-operative Risk Assessment    Patient Name: Douglas Chandler  DOB: 11-10-1956 MRN: [id number]    DATE OF LAST VISIT: 08/17/23 DR. Lalla Brothers DATE OF NEXT VISIT: NONE Request for Surgical Clearance    Procedure:  Dental Extraction - Amount of Teeth to be Pulled:  1 TOOTH SURGICALLY EXTRACTED  Date of Surgery:  Clearance TBD                                 Surgeon:  DR. Mayo Ao, DDS Surgeon's Group or Practice Name:  [facility] Phone number:  [phone] Fax number:  [phone]   Type of Clearance Requested:   - Medical  - Pharmacy:  Hold Apixaban (Eliquis)     Type of Anesthesia:  Local    Additional requests/questions:    Elpidio Anis   10/18/2023, 9:12 AM

## 2023-10-18 NOTE — Telephone Encounter (Signed)
   Patient Name: ADALBERTO METZGAR  DOB: 13-Oct-1956 MRN: [id number]  Primary Cardiologist: None  Chart reviewed as part of pre-operative protocol coverage.   Dental extractions (i.e. 1-2 teeth) are considered low risk procedures per guidelines and generally do not require any specific cardiac clearance. It is also generally accepted that for simple extractions and dental cleanings, there is no need to interrupt blood thinner therapy.   SBE prophylaxis is not required for the patient from a cardiac standpoint.  I will route this recommendation to the requesting party via Epic fax function and remove from pre-op pool.  Please call with questions.  Napoleon Form, Leodis Rains, NP 10/18/2023, 9:56 AM

## 2023-10-20 NOTE — Telephone Encounter (Signed)
Patient called to say office hasn't received our office response. Can info be refax. Please advise

## 2023-10-20 NOTE — Telephone Encounter (Signed)
I will re-fax clearance notes today.

## 2023-10-23 NOTE — Telephone Encounter (Signed)
I s/w the DDS office today and gave verbal instructions as pt is there now I stated that we faxed clearance notes x 2. I did confirm the fax # and found the fax # was incorrect.   Correct fax# is [phone].   I assured the DDS I will re-fax notes to the correct fax#

## 2023-11-10 ENCOUNTER — Ambulatory Visit
Admission: EM | Admit: 2023-11-10 | Discharge: 2023-11-10 | Disposition: A | Discharge status: Home / Self Care | Payer: BC Managed Care – PPO

## 2023-11-10 DIAGNOSIS — J029 Acute pharyngitis, unspecified: Secondary | ICD-10-CM | POA: Diagnosis not present

## 2023-11-10 DIAGNOSIS — J0100 Acute maxillary sinusitis, unspecified: Secondary | ICD-10-CM | POA: Diagnosis not present

## 2023-11-10 DIAGNOSIS — R0982 Postnasal drip: Secondary | ICD-10-CM | POA: Diagnosis not present

## 2023-11-10 MED ORDER — AMOXICILLIN-POT CLAVULANATE 875-125 MG PO TABS: 1.0000 | ORAL_TABLET | Freq: Two times a day (BID) | ORAL | 0 refills | Status: DC
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
## 2023-11-10 NOTE — Discharge Instructions (Signed)
I believe that sinus drainage is causing your sore throat.  Start Augmentin twice daily for 7 days.  Gargle with warm salt water and use Tylenol for pain relief.  If your symptoms are not improving within a few days or if anything worsens please return for reevaluation including swelling of your throat, shortness of breath, chest pain, worsening cough, fever.

## 2023-11-10 NOTE — ED Triage Notes (Signed)
"  I am here for a Cough that improved but now with sore throat, the sore throat is mainly in the mornings". No fever. No sob. No wheezing.

## 2023-11-13 ENCOUNTER — Ambulatory Visit: Payer: BC Managed Care – PPO | Admitting: Dermatology

## 2023-11-13 ENCOUNTER — Encounter: Payer: Self-pay | Admitting: Dermatology

## 2023-11-13 VITALS — BP 128/79

## 2023-11-13 DIAGNOSIS — Z808 Family history of malignant neoplasm of other organs or systems: Secondary | ICD-10-CM

## 2023-11-13 DIAGNOSIS — L570 Actinic keratosis: Secondary | ICD-10-CM | POA: Diagnosis not present

## 2023-11-13 DIAGNOSIS — L719 Rosacea, unspecified: Secondary | ICD-10-CM | POA: Diagnosis not present

## 2023-11-13 MED ORDER — DOXYCYCLINE HYCLATE 50 MG PO CAPS
50.0000 mg | ORAL_CAPSULE | Freq: Every day | ORAL | 0 refills | Status: DC
Start: 2023-11-13 — End: 2024-02-12

## 2023-11-13 MED ORDER — METRONIDAZOLE 0.75 % EX CREA: TOPICAL_CREAM | Freq: Two times a day (BID) | CUTANEOUS | 0 refills | Status: DC

## 2023-11-13 NOTE — Patient Instructions (Addendum)
Hello Douglas Chandler,  Thank you for visiting my office today. Your dedication to enhancing your skin health is greatly appreciated. Below is a summary of our discussion and your personalized treatment plan:  - Actinic Keratosis Treatment: The affected areas were treated with liquid nitrogen today. A second treatment may be necessary in 6 months.  - Rosacea Management:   - Metronidazole Cream: Continue applying this cream twice daily, once in the morning and once at night.   - Doxycycline: Continue taking Doxycycline 50 mg daily, with food. A 59-month supply has been prescribed for you.   - Moisturizer with SPF: Use La Roche-Posay Toleriane Double Repair Face Moisturizer with SPF daily to protect your skin.   - Aquaphor Application: Apply Aquaphor morning and night after washing your face to maintain hydration.  - Skin Care Recommendations:   - Gentle Face Wash and Moisturizer: Opt for products suitable for rosacea, such as La Roche-Posay or CeraVe, for your daily skincare routine.   - Sun Protection: Wear a hat and sunscreen when outdoors to protect your skin from sun damage.  - Follow-Up: A skin cancer screening is scheduled in four months to monitor any new or suspicious spots on your skin.  Please do not hesitate to reach out if you have any questions or concerns before our next appointment. Wishing you a wonderful holiday season!  Warm regards,  Dr. [facility]    Cryotherapy Aftercare  Wash gently with soap and water everyday.   Apply Vaseline and Band-Aid daily until healed.    Important Information  Due to recent changes in healthcare laws, you may see results of your pathology and/or laboratory studies on MyChart before the doctors have had a chance to review them. We understand that in some cases there may be results that are confusing or concerning to you. Please understand that not all results are received at the same time and often the doctors may need to  interpret multiple results in order to provide you with the best plan of care or course of treatment. Therefore, we ask that you please give [place] 2 business days to thoroughly review all your results before contacting the office for clarification. Should we see a critical lab result, you will be contacted sooner.   If You Need Anything After Your Visit  If you have any questions or concerns for your doctor, please call our main line at [phone] If no one answers, please leave a voicemail as directed and we will return your call as soon as possible. Messages left after 4 pm will be answered the following business day.   You may also send [place] a message via MyChart. We typically respond to MyChart messages within 1-2 business days.  For prescription refills, please ask your pharmacy to contact our office. Our fax number is [phone].  If you have an urgent issue when the clinic is closed that cannot wait until the next business day, you can page your doctor at the number below.    Please note that while we do our best to be available for urgent issues outside of office hours, we are not available 24/7.   If you have an urgent issue and are unable to reach [place], you may choose to seek medical care at your doctor's office, retail clinic, urgent care center, or emergency room.  If you have a medical emergency, please immediately call 911 or go to the emergency department. In the event of inclement weather, please call our main line  at [phone] for an update on the status of any delays or closures.  Dermatology Medication Tips: Please keep the boxes that topical medications come in in order to help keep track of the instructions about where and how to use these. Pharmacies typically print the medication instructions only on the boxes and not directly on the medication tubes.   If your medication is too expensive, please contact our office at [phone] or send [place] a message through MyChart.    We are unable to tell what your co-pay for medications will be in advance as this is different depending on your insurance coverage. However, we may be able to find a substitute medication at lower cost or fill out paperwork to get insurance to cover a needed medication.   If a prior authorization is required to get your medication covered by your insurance company, please allow [place] 1-2 business days to complete this process.  Drug prices often vary depending on where the prescription is filled and some pharmacies may offer cheaper prices.  The website www.goodrx.com contains coupons for medications through different pharmacies. The prices here do not account for what the cost may be with help from insurance (it may be cheaper with your insurance), but the website can give you the price if you did not use any insurance.  - You can print the associated coupon and take it with your prescription to the pharmacy.  - You may also stop by our office during regular business hours and pick up a GoodRx coupon card.  - If you need your prescription sent electronically to a different pharmacy, notify our office through [facility] or by phone at [phone]

## 2023-11-13 NOTE — Progress Notes (Signed)
New Patient Visit   Subjective  Douglas Chandler is a 67 y.o. male who presents for the following: Rosacea   Patient states he  has rosacea located at the face that he  would like to have examined. Patient reports the areas have been there for 1 month. He reports the areas are bothersome.Patient rates irritation 6 out of 10 as he mentioned that the areas burn a little when he has to shave. He states that the areas have not spread. Patient reports he  has not previously been treated for these areas. Patient denies Hx of bx. Patient reports  family history of skin cancer(s) (father - melanoma).   The following portions of the chart were reviewed this encounter and updated as appropriate: medications, allergies, medical history  Review of Systems:  No other skin or systemic complaints except as noted in HPI or Assessment and Plan.  Objective  Well appearing patient in no apparent distress; mood and affect are within normal limits.   A focused examination was performed of the following areas: face   Relevant exam findings are noted in the Assessment and Plan.  Scalp, Face & Ears (21) Erythematous thin papules/macules with gritty scale.           Assessment & Plan   ROSACEA Exam Mid face erythema with telangiectasias +/- scattered inflammatory papules  flared  Rosacea is a chronic progressive skin condition usually affecting the face of adults, causing redness and/or acne bumps. It is treatable but not curable. It sometimes affects the eyes (ocular rosacea) as well. It may respond to topical and/or systemic medication and can flare with stress, sun exposure, alcohol, exercise, topical steroids (including hydrocortisone/cortisone 10) and some foods.  Daily application of broad spectrum spf 30+ sunscreen to face is recommended to reduce flares.  Patient denies grittiness of the eyes  Treatment Plan - Rx Metronidazole cream - use BID morning and night - Recommended LaRoche Posay  double moisturizer with tolerine. Samples provided.  - Rx Doxycycline 50mg  - take daily with food - 90 day supply sent.  - Encouraged patient to wear sunscreen and hat while doing outdoor activities.    Actinic Keratosis - Assessment: Patient presents with multiple actinic keratoses on cheeks, forehead, and nose, described as pink, rough, hyperkeratotic papules. These lesions are tender and easily irritated, particularly when shaving. A total of 21 spots identified on face and ears. - Plan: Perform cryotherapy with liquid nitrogen on 21 spots. Instruct patient to apply Aquaphor to the treated areas morning and night after face washing. Schedule follow-up in 4 months for skin cancer screening. Advise patient to wear a hat when outdoors.     Rosacea  Related Medications doxycycline (VIBRAMYCIN) 50 MG capsule TAKE 1 CAPSULE BY MOUTH 2 TIMES DAILY.  AK (actinic keratosis) (21) Scalp, Face & Ears  Destruction of lesion - Scalp, Face & Ears (21) Complexity: simple   Destruction method: cryotherapy   Informed consent: discussed and consent obtained   Timeout:  patient name, date of birth, surgical site, and procedure verified Lesion destroyed using liquid nitrogen: Yes   Region frozen until ice ball extended beyond lesion: Yes   Outcome: patient tolerated procedure well with no complications   Post-procedure details: wound care instructions given      Return in about 4 years (around 11/13/2027) for TBSE.    Documentation: I have reviewed the above documentation for accuracy and completeness, and I agree with the above.   I, Shirron Marcha Solders, CMA, am  acting as scribe for Langston Reusing, DO.   Langston Reusing, DO

## 2023-11-14 ENCOUNTER — Other Ambulatory Visit: Payer: Self-pay | Admitting: Adult Health

## 2023-11-14 DIAGNOSIS — L719 Rosacea, unspecified: Secondary | ICD-10-CM

## 2023-11-21 ENCOUNTER — Ambulatory Visit (INDEPENDENT_AMBULATORY_CARE_PROVIDER_SITE_OTHER): Payer: BC Managed Care – PPO | Admitting: Adult Health

## 2023-11-21 VITALS — BP 120/70 | HR 63 | Temp 97.5°F | Ht 75.0 in | Wt 276.0 lb

## 2023-11-21 DIAGNOSIS — J988 Other specified respiratory disorders: Secondary | ICD-10-CM | POA: Diagnosis not present
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
## 2024-01-08 ENCOUNTER — Encounter: Payer: Self-pay | Admitting: Adult Health

## 2024-01-08 DIAGNOSIS — M7750 Other enthesopathy of unspecified foot: Secondary | ICD-10-CM

## 2024-01-09 NOTE — Telephone Encounter (Signed)
Ok to write referral ortho or would pt need to be seen in office?

## 2024-01-12 ENCOUNTER — Ambulatory Visit (INDEPENDENT_AMBULATORY_CARE_PROVIDER_SITE_OTHER): Payer: BC Managed Care – PPO | Admitting: Physician Assistant

## 2024-01-12 DIAGNOSIS — M19071 Primary osteoarthritis, right ankle and foot: Secondary | ICD-10-CM | POA: Insufficient documentation
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
## 2024-01-15 ENCOUNTER — Encounter: Payer: Self-pay | Admitting: Physician Assistant

## 2024-01-17 ENCOUNTER — Ambulatory Visit: Payer: BC Managed Care – PPO | Admitting: Orthopedic Surgery

## 2024-01-17 ENCOUNTER — Other Ambulatory Visit (INDEPENDENT_AMBULATORY_CARE_PROVIDER_SITE_OTHER): Payer: Self-pay

## 2024-01-17 ENCOUNTER — Encounter: Payer: Self-pay | Admitting: Orthopedic Surgery

## 2024-01-17 DIAGNOSIS — M79601 Pain in right arm: Secondary | ICD-10-CM

## 2024-01-17 MED ORDER — PREDNISONE 5 MG (21) PO TBPK: ORAL_TABLET | ORAL | 0 refills | Status: DC
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
## 2024-01-19 ENCOUNTER — Encounter: Payer: Self-pay | Admitting: Orthopedic Surgery

## 2024-02-07 ENCOUNTER — Ambulatory Visit: Payer: BC Managed Care – PPO | Admitting: Orthopedic Surgery

## 2024-02-07 DIAGNOSIS — M79601 Pain in right arm: Secondary | ICD-10-CM | POA: Diagnosis not present

## 2024-02-08 ENCOUNTER — Encounter: Payer: Self-pay | Admitting: Orthopedic Surgery
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
## 2024-02-12 ENCOUNTER — Other Ambulatory Visit: Payer: Self-pay | Admitting: Dermatology

## 2024-02-12 DIAGNOSIS — L719 Rosacea, unspecified: Secondary | ICD-10-CM

## 2024-02-22 NOTE — Progress Notes (Unsigned)
  Electrophysiology Office Note:   Date:  02/23/2024  ID:  Douglas Chandler, DOB Aug 16, 1956, MRN [id number]  Primary Cardiologist: None Electrophysiologist: Lanier Prude, MD      History of Present Illness:   Douglas Chandler is a 68 y.o. male with h/o HTN, OSA w/CPAP, AFL s/p ablation, AF, and cardiomyopathy seen today for routine electrophysiology followup.   Since last being seen in our clinic the patient reports doing OK. He has had 3-4 episodes of AF by symptoms and by his watch. They wake him up at night with mild chest pressure (consistent with prior episodes) and he feels washed out the next day. Takes a prn diltiazem with improvement. Using CPAP nightly. Wants to try and lose some weight. Otherwise,  he denies exertional chest pain,  dyspnea, PND, orthopnea, nausea, vomiting, dizziness, syncope, edema, or early satiety.   Review of systems complete and found to be negative unless listed in HPI.   EP Information / Studies Reviewed:    EKG is ordered today. Personal review as below.  EKG Interpretation Date/Time:  Friday February 23 2024 08:09:28 EDT Ventricular Rate:  62 PR Interval:  166 QRS Duration:  98 QT Interval:  452 QTC Calculation: 458 R Axis:   23  Text Interpretation: Normal sinus rhythm Normal ECG When compared with ECG of 17-Aug-2023 08:23, No significant change was found Confirmed by Maxine Glenn [id number]) on 02/23/2024 8:14:01 AM    Arrhythmia/Device History S/p CTI ablation 2019   Physical Exam:   VS:  BP 118/72   Pulse 62   Ht 6' 3.5" (1.918 m)   Wt 279 lb 3.2 oz (126.6 kg)   SpO2 96%   BMI 34.44 kg/m    Wt Readings from Last 3 Encounters:  02/23/24 279 lb 3.2 oz (126.6 kg)  11/21/23 276 lb (125.2 kg)  11/10/23 273 lb 2.4 oz (123.9 kg)     GEN: No acute distress NECK: No JVD; No carotid bruits CARDIAC: Regular rate and rhythm, no murmurs, rubs, gallops RESPIRATORY:  Clear to auscultation without rales, wheezing or rhonchi  ABDOMEN: Soft,  non-tender, non-distended EXTREMITIES:  No edema; No deformity   ASSESSMENT AND PLAN:    Paroxysmal AF Atrial flutter High risk drug monitoring S/p CTI ablation 2019 EKG today shows NSR with stable intervals Continue tikosyn 500 mcg BID. Labs today.  Continue eliquis 5 mg BID for CHA2DS2/VASc of at least 3.  Starting to have recurrence despite tikosyn.  Increase toprol to 50 mg q am and 100 mg q pm.   HTN Stable on current regimen   Secondary hypercoagulable state Pt on Eliquis as above   Obesity Body mass index is 34.44 kg/m.  Encouraged lifestyle modification    Follow up with Dr. Lalla Brothers in 3-4 months to discuss possibility of AF ablation  Signed, Graciella Freer, PA-C

## 2024-02-23 ENCOUNTER — Encounter: Payer: Self-pay | Admitting: Student

## 2024-02-23 ENCOUNTER — Ambulatory Visit: Payer: BC Managed Care – PPO | Attending: Cardiology | Admitting: Student

## 2024-02-23 VITALS — BP 118/72 | HR 62 | Ht 75.5 in | Wt 279.2 lb

## 2024-02-23 DIAGNOSIS — Z79899 Other long term (current) drug therapy: Secondary | ICD-10-CM

## 2024-02-23 DIAGNOSIS — I428 Other cardiomyopathies: Secondary | ICD-10-CM

## 2024-02-23 DIAGNOSIS — I10 Essential (primary) hypertension: Secondary | ICD-10-CM

## 2024-02-23 DIAGNOSIS — I4819 Other persistent atrial fibrillation: Secondary | ICD-10-CM | POA: Diagnosis not present

## 2024-02-23 LAB — BASIC METABOLIC PANEL
BUN/Creatinine Ratio: 12 (ref 10–24)
BUN: 11 mg/dL (ref 8–27)
CO2: 25 mmol/L (ref 20–29)
Calcium: 9.8 mg/dL (ref 8.6–10.2)
Chloride: 103 mmol/L (ref 96–106)
Creatinine, Ser: 0.93 mg/dL (ref 0.76–1.27)
Glucose: 98 mg/dL (ref 70–99)
Potassium: 4.2 mmol/L (ref 3.5–5.2)
Sodium: 140 mmol/L (ref 134–144)
eGFR: 91 mL/min/{1.73_m2} (ref >59)

## 2024-02-23 LAB — MAGNESIUM: Magnesium: 2 mg/dL (ref 1.6–2.3)

## 2024-02-23 MED ORDER — METOPROLOL SUCCINATE ER 50 MG PO TB24
ORAL_TABLET | ORAL | 3 refills | Status: DC
Start: 2024-02-23 — End: 2024-03-20

## 2024-02-23 NOTE — Patient Instructions (Signed)
 Medication Instructions:  Increase metoprolol succinate (Toprol-XL) to taking 1 tablet (50 mg) in the morning and 2 tablets (100 mg) at bedtime *If you need a refill on your cardiac medications before your next appointment, please call your pharmacy*  Lab Work: BMET, MAG-TODAY If you have labs (blood work) drawn today and your tests are completely normal, you will receive your results only by: MyChart Message (if you have MyChart) OR A paper copy in the mail If you have any lab test that is abnormal or we need to change your treatment, we will call you to review the results.  Follow-Up: At [facility], you and your health needs are our priority.  As part of our continuing mission to provide you with exceptional heart care, we have created designated Provider Care Teams.  These Care Teams include your primary Cardiologist (physician) and Advanced Practice Providers (APPs -  Physician Assistants and Nurse Practitioners) who all work together to provide you with the care you need, when you need it.  Your next appointment:   3-4 month(s)  Provider:   Steffanie Dunn, MD only to discuss ablation

## 2024-03-13 ENCOUNTER — Encounter: Payer: Self-pay | Admitting: Dermatology

## 2024-03-13 ENCOUNTER — Ambulatory Visit (INDEPENDENT_AMBULATORY_CARE_PROVIDER_SITE_OTHER): Payer: BC Managed Care – PPO | Admitting: Dermatology

## 2024-03-13 VITALS — BP 142/85

## 2024-03-13 DIAGNOSIS — L570 Actinic keratosis: Secondary | ICD-10-CM | POA: Diagnosis not present

## 2024-03-13 DIAGNOSIS — L719 Rosacea, unspecified: Secondary | ICD-10-CM | POA: Diagnosis not present

## 2024-03-13 DIAGNOSIS — Z79899 Other long term (current) drug therapy: Secondary | ICD-10-CM

## 2024-03-13 MED ORDER — DOXYCYCLINE HYCLATE 50 MG PO CAPS: 50.0000 mg | ORAL_CAPSULE | Freq: Every day | ORAL | 2 refills | Status: DC

## 2024-03-13 MED ORDER — SAFETY SEAL MISCELLANEOUS MISC
2 refills | Status: DC
Start: 2024-03-13 — End: 2024-09-16

## 2024-03-13 NOTE — Progress Notes (Signed)
 Follow-Up Visit   Subjective  Douglas Chandler is a 68 y.o. male who presents for the following: Rosacea & AK  Patient present today for follow up visit. Patient was last evaluated on 11/13/23. At this visit patient was prescribed Metronidazole cream, Doxy 50mg , recommended LRP double moisturizer with tolerine. Cryo was done on face, scalp & ears. Patient reports sxs are better. Patient denies medication changes.  The following portions of the chart were reviewed this encounter and updated as appropriate: medications, allergies, medical history  Review of Systems:  No other skin or systemic complaints except as noted in HPI or Assessment and Plan.  Objective  Well appearing patient in no apparent distress; mood and affect are within normal limits.   A focused examination was performed of the following areas: face   Relevant exam findings are noted in the Assessment and Plan.        Scalp, Face & Ears (6) Erythematous thin papules/macules with gritty scale. Erythematous thin papules/macules with gritty scale.   Assessment & Plan   ROSACEA Exam Mid face erythema with telangiectasias +/- scattered inflammatory papules   Assessment: Patient reports improvement in bumpy flares with current treatment of Doxycycline once daily and topical metronidazole cream at least once daily. Examination shows overall background pink redness consistent with rosacea. Current treatment regimen has shown some efficacy, but there is room for further improvement in managing the redness.  Not at goal  Rosacea is a chronic progressive skin condition usually affecting the face of adults, causing redness and/or acne bumps. It is treatable but not curable. It sometimes affects the eyes (ocular rosacea) as well. It may respond to topical and/or systemic medication and can flare with stress, sun exposure, alcohol, exercise, topical steroids (including hydrocortisone/cortisone 10) and some foods.  Daily  application of broad spectrum spf 30+ sunscreen to face is recommended to reduce flares.  Patient denies grittiness of the eyes  Treatment Plan - Rx Medrock - Rosacea Extra with Oxymetazoline USP 0.8% Mnetronidazole USP 1% Nicotinamide USP 3% Ivermectin USP 1% Azelaic acid EXCP 7.5% - apply thin layer amount to face twice a day. - Continue using LRP double repair moisturizer - Refill of Doxy 50mg  sent to [facility].   2. Actinic Keratoses - Assessment: Patient presents with persistent, isolated rough red areas on the face, consistent with actinic keratoses (pre-cancerous lesions). These areas are causing irritation during shaving. Previous cryotherapy treatment was performed, but some lesions persist and require additional treatment. - Plan:    Performed cryotherapy on 6 lesions: 3 on right cheek, 2 on left cheek, 1 on forehead    Apply Aquaphor to treated areas to aid healing    Wait one week before starting the new rosacea compounded cream on treated areas    Educate patient on expected healing process and potential temporary irritation from treatment   AK (ACTINIC KERATOSIS) (6) Scalp, Face & Ears (6) Destruction of lesion - Scalp, Face & Ears (6) Complexity: simple   Destruction method: cryotherapy   Informed consent: discussed and consent obtained   Timeout:  patient name, date of birth, surgical site, and procedure verified Lesion destroyed using liquid nitrogen: Yes   Region frozen until ice ball extended beyond lesion: Yes   Outcome: patient tolerated procedure well with no complications   Post-procedure details: wound care instructions given   ROSACEA   Related Medications doxycycline (VIBRAMYCIN) 50 MG capsule Take 1 capsule (50 mg total) by mouth daily. TAKE WITH FOOD.  Return for  rosacea .    Documentation: I have reviewed the above documentation for accuracy and completeness, and I agree with the above.   I, Shirron Marcha Solders, CMA, am acting as scribe for  [employer], DO.   Langston Reusing, DO

## 2024-03-13 NOTE — Patient Instructions (Addendum)
 Hello Douglas Chandler,  Thank you for visiting today. Here is a summary of the key instructions:  - Medications:   - Continue taking Doxycycline 50mg   once a day for rosacea   - Start using new prescription cream (Rosacea Compounded Cream) from [facility] pharmacy:     - Use twice a day, morning and night     - Contains metronidazole, oxymetazoline, niacinamide, ivermectin, and azelaic acid (this combination will better target all of the redness and inflammation)     - Wait one week after freezing treatment before starting  - Skin Care:   - Continue using La Roche-Posay moisturizer with sunscreen   - Apply Aquaphor to frozen spots to help them heal   - Wait one week using only Aquaphor before starting Nucream  - Treatment for Pre-Cancerous Actinic Keratoses:   - Cryotherapy (freezing) performed on 6 spots: 3 on right cheek, 2 on left cheek, 1 on forehead   - Treated spots may be crusty and irritated for about 2 weeks  - Follow-up:   - Next appointment in 6 months  Please reach out if you have any questions or concerns.  Warm regards,  Dr. Langston Reusing, Dermatology      Important Information  Due to recent changes in healthcare laws, you may see results of your pathology and/or laboratory studies on MyChart before the doctors have had a chance to review them. We understand that in some cases there may be results that are confusing or concerning to you. Please understand that not all results are received at the same time and often the doctors may need to interpret multiple results in order to provide you with the best plan of care or course of treatment. Therefore, we ask that you please give [place] 2 business days to thoroughly review all your results before contacting the office for clarification. Should we see a critical lab result, you will be contacted sooner.   If You Need Anything After Your Visit  If you have any questions or concerns for your doctor, please call our main line at  [phone] If no one answers, please leave a voicemail as directed and we will return your call as soon as possible. Messages left after 4 pm will be answered the following business day.   You may also send [place] a message via MyChart. We typically respond to MyChart messages within 1-2 business days.  For prescription refills, please ask your pharmacy to contact our office. Our fax number is [phone].  If you have an urgent issue when the clinic is closed that cannot wait until the next business day, you can page your doctor at the number below.    Please note that while we do our best to be available for urgent issues outside of office hours, we are not available 24/7.   If you have an urgent issue and are unable to reach [place], you may choose to seek medical care at your doctor's office, retail clinic, urgent care center, or emergency room.  If you have a medical emergency, please immediately call 911 or go to the emergency department. In the event of inclement weather, please call our main line at [phone] for an update on the status of any delays or closures.  Dermatology Medication Tips: Please keep the boxes that topical medications come in in order to help keep track of the instructions about where and how to use these. Pharmacies typically print the medication instructions only on the boxes and not directly on  the medication tubes.   If your medication is too expensive, please contact our office at [phone] or send [place] a message through MyChart.   We are unable to tell what your co-pay for medications will be in advance as this is different depending on your insurance coverage. However, we may be able to find a substitute medication at lower cost or fill out paperwork to get insurance to cover a needed medication.   If a prior authorization is required to get your medication covered by your insurance company, please allow [place] 1-2 business days to complete this process.  Drug  prices often vary depending on where the prescription is filled and some pharmacies may offer cheaper prices.  The website www.goodrx.com contains coupons for medications through different pharmacies. The prices here do not account for what the cost may be with help from insurance (it may be cheaper with your insurance), but the website can give you the price if you did not use any insurance.  - You can print the associated coupon and take it with your prescription to the pharmacy.  - You may also stop by our office during regular business hours and pick up a GoodRx coupon card.  - If you need your prescription sent electronically to a different pharmacy, notify our office through [facility] or by phone at [phone]

## 2024-03-20 ENCOUNTER — Telehealth: Payer: Self-pay | Admitting: Student

## 2024-03-20 DIAGNOSIS — I4819 Other persistent atrial fibrillation: Secondary | ICD-10-CM

## 2024-03-20 MED ORDER — METOPROLOL SUCCINATE ER 50 MG PO TB24: ORAL_TABLET | ORAL | 3 refills | Status: AC

## 2024-03-20 NOTE — Telephone Encounter (Signed)
 Patient states he will need more pills ordered of his metoprolol now that he is taking a total of 3 tablets daily.  Will send new Rx with updated quantity to give Pt 90 day supply.  Patient verbalized understanding and expressed appreciation for assistance.

## 2024-03-20 NOTE — Telephone Encounter (Signed)
 Pt c/o medication issue:  1. Name of Medication:   metoprolol succinate (TOPROL-XL) 50 MG 24 hr tablet    2. How are you currently taking this medication (dosage and times per day)? As written   3. Are you having a reaction (difficulty breathing--STAT)? No   4. What is your medication issue? Pt called in about this med. He states he is taking 3x times now instead of twice and he would like a higher quantity of tablets sent in. Please advise.

## 2024-03-21 ENCOUNTER — Other Ambulatory Visit: Payer: Self-pay

## 2024-03-21 DIAGNOSIS — I4891 Unspecified atrial fibrillation: Secondary | ICD-10-CM

## 2024-03-21 MED ORDER — APIXABAN 5 MG PO TABS: 5.0000 mg | ORAL_TABLET | Freq: Two times a day (BID) | ORAL | 1 refills | Status: DC

## 2024-03-21 NOTE — Telephone Encounter (Signed)
 Prescription refill request for Eliquis received. Indication: Afib  Last office visit: 02/23/24 Lanna Poche)  Scr: 0.93 (02/23/24)  Age: 68 Weight: 126.6kg  Appropriate dose. Refill sent.

## 2024-04-10 ENCOUNTER — Encounter: Payer: Self-pay | Admitting: Adult Health

## 2024-04-10 ENCOUNTER — Ambulatory Visit (INDEPENDENT_AMBULATORY_CARE_PROVIDER_SITE_OTHER): Admitting: Adult Health

## 2024-04-10 ENCOUNTER — Encounter (INDEPENDENT_AMBULATORY_CARE_PROVIDER_SITE_OTHER): Payer: Self-pay

## 2024-04-10 VITALS — BP 100/60 | HR 56 | Temp 98.1°F | Ht 73.0 in | Wt 275.0 lb

## 2024-04-10 DIAGNOSIS — R7303 Prediabetes: Secondary | ICD-10-CM | POA: Diagnosis not present

## 2024-04-10 DIAGNOSIS — Z23 Encounter for immunization: Secondary | ICD-10-CM

## 2024-04-10 DIAGNOSIS — E782 Mixed hyperlipidemia: Secondary | ICD-10-CM

## 2024-04-10 DIAGNOSIS — Z6836 Body mass index (BMI) 36.0-36.9, adult: Secondary | ICD-10-CM

## 2024-04-10 DIAGNOSIS — I5022 Chronic systolic (congestive) heart failure: Secondary | ICD-10-CM

## 2024-04-10 DIAGNOSIS — Z0000 Encounter for general adult medical examination without abnormal findings: Secondary | ICD-10-CM

## 2024-04-10 DIAGNOSIS — Z125 Encounter for screening for malignant neoplasm of prostate: Secondary | ICD-10-CM

## 2024-04-10 DIAGNOSIS — G4733 Obstructive sleep apnea (adult) (pediatric): Secondary | ICD-10-CM | POA: Diagnosis not present

## 2024-04-10 DIAGNOSIS — I10 Essential (primary) hypertension: Secondary | ICD-10-CM | POA: Diagnosis not present

## 2024-04-10 DIAGNOSIS — I4819 Other persistent atrial fibrillation: Secondary | ICD-10-CM

## 2024-04-10 DIAGNOSIS — E66811 Obesity, class 1: Secondary | ICD-10-CM

## 2024-04-10 LAB — LIPID PANEL
Cholesterol: 126 mg/dL (ref 0–200)
HDL: 31 mg/dL — ABNORMAL LOW (ref >39.00)
LDL Cholesterol: 65 mg/dL (ref 0–99)
NonHDL: 94.89
Total CHOL/HDL Ratio: 4
Triglycerides: 150 mg/dL — ABNORMAL HIGH (ref 0.0–149.0)
VLDL: 30 mg/dL (ref 0.0–40.0)

## 2024-04-10 LAB — COMPREHENSIVE METABOLIC PANEL WITH GFR
ALT: 32 U/L (ref 0–53)
AST: 24 U/L (ref 0–37)
Albumin: 4.6 g/dL (ref 3.5–5.2)
Alkaline Phosphatase: 48 U/L (ref 39–117)
BUN: 16 mg/dL (ref 6–23)
CO2: 28 meq/L (ref 19–32)
Calcium: 9.6 mg/dL (ref 8.4–10.5)
Chloride: 103 meq/L (ref 96–112)
Creatinine, Ser: 0.88 mg/dL (ref 0.40–1.50)
GFR: 90.34 mL/min (ref >60.00)
Glucose, Bld: 102 mg/dL — ABNORMAL HIGH (ref 70–99)
Potassium: 4.3 meq/L (ref 3.5–5.1)
Sodium: 139 meq/L (ref 135–145)
Total Bilirubin: 1.1 mg/dL (ref 0.2–1.2)
Total Protein: 7.2 g/dL (ref 6.0–8.3)

## 2024-04-10 LAB — CBC
HCT: 46.2 % (ref 39.0–52.0)
Hemoglobin: 16 g/dL (ref 13.0–17.0)
MCHC: 34.6 g/dL (ref 30.0–36.0)
MCV: 92.8 fl (ref 78.0–100.0)
Platelets: 187 10*3/uL (ref 150.0–400.0)
RBC: 4.98 Mil/uL (ref 4.22–5.81)
RDW: 13.4 % (ref 11.5–15.5)
WBC: 7.1 10*3/uL (ref 4.0–10.5)

## 2024-04-10 LAB — HEMOGLOBIN A1C: Hgb A1c MFr Bld: 5.7 % (ref 4.6–6.5)

## 2024-04-10 LAB — TSH: TSH: 1.81 u[IU]/mL (ref 0.35–5.50)

## 2024-04-10 LAB — PSA: PSA: 0.36 ng/mL (ref 0.10–4.00)
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
## 2024-04-10 NOTE — Patient Instructions (Signed)
 It was great seeing you today   We will follow up with you regarding your lab work   Please let me know if you need anything

## 2024-05-11 ENCOUNTER — Other Ambulatory Visit: Payer: Self-pay | Admitting: Adult Health

## 2024-05-11 DIAGNOSIS — E782 Mixed hyperlipidemia: Secondary | ICD-10-CM

## 2024-06-06 NOTE — Progress Notes (Signed)
 Electrophysiology Office Follow up Visit Note:    Date:  06/07/2024   ID:  Douglas Chandler, DOB 05/14/1956, MRN [id number]  PCP:  Merna Huxley, NP  [facility] Cardiologist:  None  [facility] Electrophysiologist:  OLE ONEIDA HOLTS, MD    Interval History:     Douglas Chandler is a 68 y.o. male who presents for a follow up visit.   I last saw the patient August 17, 2023.  He has a history of atrial flutter ablation in 2019.  He subsequently developed atrial fibrillation in 2021 and was started on dofetilide .  He takes Eliquis  for stroke prophylaxis.  He saw Jodie in clinic February 23, 2024.  His episodes of atrial fibrillation are symptomatic.  He feels fatigued and sometimes has chest discomfort.  He is referred today to discuss adjunctive ablation for his atrial fibrillation.  He tells me that he continues to have episodes of atrial fibrillation that are symptomatic.  After an episode he feels tired for at least a couple of days.  He takes an as needed Cardizem  when he has an episode of atrial fibrillation.  Episodes are lasting at least 2 hours.  He is going to the healthy living/weight loss clinic next week and is eager to begin his weight loss efforts.  He continues to drink sweet tea.      Past medical, surgical, social and family history were reviewed.  ROS:   Please see the history of present illness.    All other systems reviewed and are negative.  EKGs/Labs/Other Studies Reviewed:    The following studies were reviewed today:  Apr 12, 2023 echo EF 55% RV mildly reduced Mildly dilated left atrium  February 23, 2024 EKG shows sinus rhythm.  QTc 460 ms.  Narrow QRS  April 09, 2023 EKG shows atrial fibrillation.  PVC.  Ventricular rate 135 bpm      Physical Exam:    VS:  BP 102/60   Pulse (!) 53   Ht 6' 1 (1.854 m)   Wt 273 lb (123.8 kg)   SpO2 95%   BMI 36.02 kg/m     Wt Readings from Last 3 Encounters:  06/07/24 273 lb (123.8 kg)  04/10/24 275 lb  (124.7 kg)  02/23/24 279 lb 3.2 oz (126.6 kg)     GEN: no distress CARD: RRR, No MRG RESP: No IWOB. CTAB.      ASSESSMENT:    1. Persistent atrial fibrillation (HCC)   2. Encounter for long-term (current) use of high-risk medication    PLAN:    In order of problems listed above:  #Persistent atrial fibrillation #High risk drug monitoring-dofetilide  The patient is starting to have more breakthrough episodes of atrial fibrillation despite treatment with dofetilide .  I have discussed treatment options including continuing dofetilide , transitioning to amiodarone or pursuing adjunct of catheter ablation.  Discussed treatment options today for AF including antiarrhythmic drug therapy and ablation. Discussed risks, recovery and likelihood of success with each treatment strategy. Risk, benefits, and alternatives to EP study and ablation for afib were discussed. These risks include but are not limited to stroke, bleeding, vascular damage, tamponade, perforation, damage to the esophagus, lungs, phrenic nerve and other structures, pulmonary vein stenosis, worsening renal function, coronary vasospasm and death.  Discussed potential need for repeat ablation procedures and antiarrhythmic drugs after an initial ablation.  The patient will let us  know if he wants to proceed with the ablation procedure.  If he elects to proceed, Carto, ICE, anesthesia  are requested for the procedure.    QTc acceptable for ongoing dofetilide  use  Continue Eliquis  for stroke prophylaxis   Signed, Ole Holts, MD, [facility], [facility] 06/07/2024 8:17 AM    Electrophysiology [facility]

## 2024-06-07 ENCOUNTER — Ambulatory Visit: Attending: Cardiology | Admitting: Cardiology

## 2024-06-07 ENCOUNTER — Encounter: Payer: Self-pay | Admitting: Cardiology

## 2024-06-07 VITALS — BP 102/60 | HR 53 | Ht 73.0 in | Wt 273.0 lb

## 2024-06-07 DIAGNOSIS — I4819 Other persistent atrial fibrillation: Secondary | ICD-10-CM

## 2024-06-07 DIAGNOSIS — Z79899 Other long term (current) drug therapy: Secondary | ICD-10-CM | POA: Diagnosis not present

## 2024-06-07 NOTE — Patient Instructions (Addendum)
 Medication Instructions:  Your physician recommends that you continue on your current medications as directed. Please refer to the Current Medication list given to you today.  *If you need a refill on your cardiac medications before your next appointment, please call your pharmacy*  Testing/Procedures: Ablation Your physician has recommended that you have an ablation. Catheter ablation is a medical procedure used to treat some cardiac arrhythmias (irregular heartbeats). During catheter ablation, a long, thin, flexible tube is put into a blood vessel in your groin (upper thigh), or neck. This tube is called an ablation catheter. It is then guided to your heart through the blood vessel. Radio frequency waves destroy small areas of heart tissue where abnormal heartbeats may cause an arrhythmia to start.  Please call us  if you decide to proceed  Follow-Up: At [facility], you and your health needs are our priority.  As part of our continuing mission to provide you with exceptional heart care, our providers are all part of one team.  This team includes your primary Cardiologist (physician) and Advanced Practice Providers or APPs (Physician Assistants and Nurse Practitioners) who all work together to provide you with the care you need, when you need it.  Your next appointment:   4 months  Provider:   You may see one of the following Advanced Practice Providers on your designated Care Team:   Charlies Arthur, PA-C Ozell Jodie Passey, PA-C Suzann Riddle, NP Daphne Barrack, NP

## 2024-06-10 ENCOUNTER — Ambulatory Visit (INDEPENDENT_AMBULATORY_CARE_PROVIDER_SITE_OTHER): Payer: Self-pay | Admitting: Family Medicine

## 2024-06-10 ENCOUNTER — Encounter (INDEPENDENT_AMBULATORY_CARE_PROVIDER_SITE_OTHER): Payer: Self-pay | Admitting: Family Medicine

## 2024-06-10 VITALS — BP 106/64 | HR 63 | Temp 98.1°F | Ht 72.0 in | Wt 273.0 lb

## 2024-06-10 DIAGNOSIS — I4819 Other persistent atrial fibrillation: Secondary | ICD-10-CM

## 2024-06-10 DIAGNOSIS — I4891 Unspecified atrial fibrillation: Secondary | ICD-10-CM | POA: Diagnosis not present

## 2024-06-10 DIAGNOSIS — E782 Mixed hyperlipidemia: Secondary | ICD-10-CM | POA: Insufficient documentation

## 2024-06-10 DIAGNOSIS — G4730 Sleep apnea, unspecified: Secondary | ICD-10-CM

## 2024-06-10 DIAGNOSIS — Z6831 Body mass index (BMI) 31.0-31.9, adult: Secondary | ICD-10-CM | POA: Insufficient documentation

## 2024-06-10 DIAGNOSIS — I5020 Unspecified systolic (congestive) heart failure: Secondary | ICD-10-CM

## 2024-06-10 DIAGNOSIS — Z0289 Encounter for other administrative examinations: Secondary | ICD-10-CM

## 2024-06-10 DIAGNOSIS — R7303 Prediabetes: Secondary | ICD-10-CM | POA: Diagnosis not present

## 2024-06-10 DIAGNOSIS — I5022 Chronic systolic (congestive) heart failure: Secondary | ICD-10-CM

## 2024-06-10 DIAGNOSIS — Z6837 Body mass index (BMI) 37.0-37.9, adult: Secondary | ICD-10-CM | POA: Insufficient documentation

## 2024-06-10 DIAGNOSIS — G4733 Obstructive sleep apnea (adult) (pediatric): Secondary | ICD-10-CM

## 2024-06-10 DIAGNOSIS — E669 Obesity, unspecified: Secondary | ICD-10-CM

## 2024-06-10 NOTE — Progress Notes (Signed)
 Office: [phone]  /  Fax: [phone]  WEIGHT SUMMARY AND BIOMETRICS  Anthropometric Measurements Height: 6' (1.829 m) Weight: 273 lb (123.8 kg) BMI (Calculated): 37.02 Weight at Last Visit: N/A Weight Lost Since Last Visit: N/A Weight Gained Since Last Visit: N/A Starting Weight: N/A   Body Composition  Body Fat %: 37.5 % Fat Mass (lbs): 102.6 lbs Muscle Mass (lbs): 162.8 lbs Total Body Water (lbs): 124.2 lbs Visceral Fat Rating : 23   Other Clinical Data Fasting: No Labs: No Today's Visit #: Consult    Chief Complaint: OBESITY    History of Present Illness Douglas Chandler is a 68 year old male with obesity who presents for a consultation regarding weight management.  He weighs 273 pounds, with a peak weight of 285 pounds, and a BMI of 37.1. His visceral fat rating is 23, and his body fat percentage is 37.5. He is 6 feet tall and has a history of significant weight loss 34 years ago, having lost 60 pounds. He is interested in losing weight again and has started engaging in physical activities such as yoga and light weight lifting with a men's group at church.  He has a history of atrial fibrillation and systolic heart failure and recently had a visit with his cardiologist. He uses a CPAP machine for sleep apnea for approximately 25 years without current issues. He has been informed of prediabetes, although he has not been diagnosed with diabetes. His last lab results indicated that his numbers are starting to drift, but he is not yet diabetic.  He has been married for 36 years, with a supportive wife who is interested in participating in his dietary changes. He has children and grandchildren and wants to maintain his health to continue enjoying time with them.  No additional symptoms reported during the review of systems.      PHYSICAL EXAM:  Blood pressure 106/64, pulse 63, temperature 98.1 F (36.7 C), height 6' (1.829 m), weight 273 lb (123.8 kg), SpO2  94%. Body mass index is 37.03 kg/m.  DIAGNOSTIC DATA REVIEWED:  BMET    Component Value Date/Time   NA 139 04/10/2024 0744   NA 140 02/23/2024 0853   K 4.3 04/10/2024 0744   CL 103 04/10/2024 0744   CO2 28 04/10/2024 0744   GLUCOSE 102 (H) 04/10/2024 0744   BUN 16 04/10/2024 0744   BUN 11 02/23/2024 0853   CREATININE 0.88 04/10/2024 0744   CALCIUM  9.6 04/10/2024 0744   GFRNONAA >60 07/30/2023 1552   GFRAA 106 07/21/2020 1528   Lab Results  Component Value Date   HGBA1C 5.7 04/10/2024   HGBA1C 5.5 03/02/2014   No results found for: INSULIN Lab Results  Component Value Date   TSH 1.81 04/10/2024   CBC    Component Value Date/Time   WBC 7.1 04/10/2024 0744   RBC 4.98 04/10/2024 0744   HGB 16.0 04/10/2024 0744   HGB 17.7 04/14/2020 0930   HCT 46.2 04/10/2024 0744   HCT 53.0 (H) 04/14/2020 0930   PLT 187.0 04/10/2024 0744   PLT 197 04/14/2020 0930   MCV 92.8 04/10/2024 0744   MCV 91 04/14/2020 0930   MCH 31.5 07/30/2023 2121   MCHC 34.6 04/10/2024 0744   RDW 13.4 04/10/2024 0744   RDW 13.4 04/14/2020 0930   Iron Studies No results found for: IRON, TIBC, FERRITIN, IRONPCTSAT Lipid Panel     Component Value Date/Time   CHOL 126 04/10/2024 0744   TRIG 150.0 (H) 04/10/2024  0744   HDL 31.00 (L) 04/10/2024 0744   CHOLHDL 4 04/10/2024 0744   VLDL 30.0 04/10/2024 0744   LDLCALC 65 04/10/2024 0744   LDLDIRECT 120.0 01/12/2022 1115   Hepatic Function Panel     Component Value Date/Time   PROT 7.2 04/10/2024 0744   ALBUMIN 4.6 04/10/2024 0744   AST 24 04/10/2024 0744   ALT 32 04/10/2024 0744   ALKPHOS 48 04/10/2024 0744   BILITOT 1.1 04/10/2024 0744   BILIDIR 0.2 05/12/2017 1125      Component Value Date/Time   TSH 1.81 04/10/2024 0744   Nutritional No results found for: VD25OH   Assessment and Plan Assessment & Plan Obesity BMI is 37.1, indicating obesity. Current weight is 273 pounds, down from a peak of 285 pounds. Visceral fat  rating is 23, and body fat percentage is 37.5%. Motivated to lose weight with a history of successful weight loss 34 years ago. Obesity is considered a disease due to genetic factors and the body's mechanisms to resist weight loss. The goal is to improve overall health and prevent complications related to obesity, such as atrial fibrillation, systolic heart failure, hyperlipidemia, sleep apnea, and prediabetes. Emphasis on a personalized approach to diet and exercise, considering specific triggers and mechanisms that contribute to weight gain. Half of patients use weight loss medications, but he is not pushed unless deemed helpful. Insurance covers visits due to comorbid conditions. Program includes frequent initial visits to adjust the plan as needed. - Schedule a workup visit with fasting for 8 hours prior, allowing water intake. - Provide new patient paperwork to be filled out completely before the next visit. - Develop a personalized eating plan based on test results and patient feedback. - Schedule follow-up visits every two weeks initially, then extend to every three to four weeks as progress is made. - Discuss potential use of weight loss medications if deemed helpful. - Monitor fat percentage, visceral fat rating, and muscle mass to assess progress.  Atrial Fibrillation Atrial fibrillation is a comorbidity that needs to be considered in the weight management plan. The goal is to improve control of atrial fibrillation through weight loss and lifestyle modifications. Cardiologist supports the inclusion of cardio exercises. - Incorporate cardio exercises as part of the weight management plan, with approval from the cardiologist.  Systolic Heart Failure Systolic heart failure is another comorbidity that will be addressed through the weight management program. The aim is to improve ejection fraction and overall heart health. - Monitor heart health and adjust the weight management plan as needed to  support heart function.  Sleep Apnea Has been using a CPAP machine for sleep apnea for approximately 25 years. Weight loss may help improve sleep apnea symptoms. - Continue CPAP therapy and monitor for improvements in sleep apnea symptoms with weight loss.  Prediabetes Numbers are starting to drift towards prediabetes, although not currently diabetic. Weight loss and lifestyle changes are intended to prevent progression to diabetes. - Monitor blood glucose levels and adjust the weight management plan to prevent progression to diabetes.  General Health Maintenance Motivated to improve health with support from family. Participation in a men's group at church for yoga and light weightlifting is encouraged. Advised against aggressive exercise initially to prevent triggering the body's resistance mechanisms. - Encourage participation in a men's group at church for yoga and light weightlifting. - Advise against aggressive exercise initially to prevent triggering the body's resistance mechanisms. - Provide guidance on healthy eating and lifestyle changes tailored to his needs.  Follow-up Committed to starting the weight management program and aware of the follow-up schedule. Uses MyChart for communication and questions between visits. - Schedule follow-up visits every two weeks initially, then extend to every three to four weeks as progress is made. - Use MyChart for communication and questions between visits.     I have personally spent 45 minutes total time today in preparation, patient care, and documentation for this visit, including the following: review of clinical lab tests; review of medical history, review of obesity and its pathophysiology and discussed various treatments based on testing results.   He was informed of the importance of frequent follow up visits to maximize his success with intensive lifestyle modifications for his multiple health conditions.    Louann Penton, MD

## 2024-06-17 ENCOUNTER — Encounter: Payer: Self-pay | Admitting: Cardiology

## 2024-06-17 MED ORDER — DILTIAZEM HCL 60 MG PO TABS: 60.0000 mg | ORAL_TABLET | Freq: Two times a day (BID) | ORAL | 3 refills | Status: AC | PRN

## 2024-06-19 ENCOUNTER — Other Ambulatory Visit: Payer: Self-pay | Admitting: Dermatology

## 2024-06-19 DIAGNOSIS — L719 Rosacea, unspecified: Secondary | ICD-10-CM

## 2024-07-13 ENCOUNTER — Other Ambulatory Visit: Payer: Self-pay | Admitting: Cardiology

## 2024-07-31 ENCOUNTER — Encounter (INDEPENDENT_AMBULATORY_CARE_PROVIDER_SITE_OTHER): Payer: Self-pay

## 2024-08-13 ENCOUNTER — Other Ambulatory Visit: Payer: Self-pay | Admitting: Medical Genetics

## 2024-08-15 ENCOUNTER — Ambulatory Visit (INDEPENDENT_AMBULATORY_CARE_PROVIDER_SITE_OTHER): Admitting: Family Medicine

## 2024-08-15 ENCOUNTER — Encounter (INDEPENDENT_AMBULATORY_CARE_PROVIDER_SITE_OTHER): Payer: Self-pay | Admitting: Family Medicine

## 2024-08-15 VITALS — BP 135/76 | HR 55 | Temp 97.7°F | Ht 72.5 in | Wt 271.0 lb

## 2024-08-15 DIAGNOSIS — Z1331 Encounter for screening for depression: Secondary | ICD-10-CM | POA: Diagnosis not present

## 2024-08-15 DIAGNOSIS — G4733 Obstructive sleep apnea (adult) (pediatric): Secondary | ICD-10-CM

## 2024-08-15 DIAGNOSIS — R5383 Other fatigue: Secondary | ICD-10-CM

## 2024-08-15 DIAGNOSIS — R7303 Prediabetes: Secondary | ICD-10-CM

## 2024-08-15 DIAGNOSIS — I10 Essential (primary) hypertension: Secondary | ICD-10-CM

## 2024-08-15 DIAGNOSIS — E782 Mixed hyperlipidemia: Secondary | ICD-10-CM

## 2024-08-15 DIAGNOSIS — I4891 Unspecified atrial fibrillation: Secondary | ICD-10-CM

## 2024-08-15 DIAGNOSIS — I4819 Other persistent atrial fibrillation: Secondary | ICD-10-CM

## 2024-08-15 DIAGNOSIS — Z6836 Body mass index (BMI) 36.0-36.9, adult: Secondary | ICD-10-CM

## 2024-08-15 DIAGNOSIS — R0602 Shortness of breath: Secondary | ICD-10-CM

## 2024-08-15 NOTE — Progress Notes (Signed)
 Office: [phone]  /  Fax: [phone]  WEIGHT SUMMARY AND BIOMETRICS  Anthropometric Measurements Height: 6' 0.5 (1.842 m) Weight: 271 lb (122.9 kg) BMI (Calculated): 36.23 Starting Weight: 271 lb Peak Weight: 293 lb Waist Measurement : 48 inches   Body Composition  Body Fat %: 37.1 % Fat Mass (lbs): 100.6 lbs Muscle Mass (lbs): 162.2 lbs Total Body Water (lbs): 123.4 lbs Visceral Fat Rating : 23   Other Clinical Data RMR: 2146 Fasting: yes Labs: yes Today's Visit #: 1 Starting Date: 08/15/24 Comments: First Visit    Chief Complaint: OBESITY    History of Present Illness Douglas Chandler is a 68 year old male with obesity who presents for an obesity workup. He was referred by his primary care physician for evaluation of obesity.  He experiences fatigue and shortness of breath with exercise. He has no history of smoking or asthma but has a history of allergies, for which he received allergy shots as a child.  He has a history of atrial fibrillation, managed with medication. He can feel when he has flare-ups, lasting from a few minutes to a couple of hours, and takes an additional pill to manage these episodes. He has not required emergency intervention since starting his current medication regimen.  He has obstructive sleep apnea and uses a CPAP machine every night. He is concerned about power outages affecting his CPAP use, as he feels significantly worse without it.  He is prediabetic and has been mindful of his diet, having quit soft drinks six years ago. He drinks sweet tea and unsweetened juices with zero calories and sugar, and consumes electrolyte drinks like Body Armor and Gatorade Zero during heavy physical activity.  He is actively trying to manage his weight by walking regularly, achieving 8,000 to 11,000 steps daily through various activities, including work and walking on a track. His goal weight is around 220 pounds.  His diet includes a preference  for sweets and peanuts, with cravings mostly occurring in the middle of the day. He does not skip meals and is not a picky eater, though he dislikes asparagus. He acknowledges emotional eating and portion control issues, particularly with snacking and having sweets after meals. His wife assists with portion control by sharing meals and avoiding buffets.  He has a supportive family environment, living with his wife and 17 year old daughter, who sometimes eats separately due to her own schedule and preferences. His wife is involved in grocery shopping and meal preparation, focusing on healthy choices like avoiding fried foods.      PHYSICAL EXAM:  Blood pressure 135/76, pulse (!) 55, temperature 97.7 F (36.5 C), height 6' 0.5 (1.842 m), weight 271 lb (122.9 kg), SpO2 97%. Body mass index is 36.25 kg/m.  DIAGNOSTIC DATA REVIEWED:  BMET    Component Value Date/Time   NA 139 04/10/2024 0744   NA 140 02/23/2024 0853   K 4.3 04/10/2024 0744   CL 103 04/10/2024 0744   CO2 28 04/10/2024 0744   GLUCOSE 102 (H) 04/10/2024 0744   BUN 16 04/10/2024 0744   BUN 11 02/23/2024 0853   CREATININE 0.88 04/10/2024 0744   CALCIUM  9.6 04/10/2024 0744   GFRNONAA >60 07/30/2023 1552   GFRAA 106 07/21/2020 1528   Lab Results  Component Value Date   HGBA1C 5.7 04/10/2024   HGBA1C 5.5 03/02/2014   No results found for: INSULIN  Lab Results  Component Value Date   TSH 1.81 04/10/2024   CBC    Component Value  Date/Time   WBC 7.1 04/10/2024 0744   RBC 4.98 04/10/2024 0744   HGB 16.0 04/10/2024 0744   HGB 17.7 04/14/2020 0930   HCT 46.2 04/10/2024 0744   HCT 53.0 (H) 04/14/2020 0930   PLT 187.0 04/10/2024 0744   PLT 197 04/14/2020 0930   MCV 92.8 04/10/2024 0744   MCV 91 04/14/2020 0930   MCH 31.5 07/30/2023 2121   MCHC 34.6 04/10/2024 0744   RDW 13.4 04/10/2024 0744   RDW 13.4 04/14/2020 0930   Iron Studies No results found for: IRON, TIBC, FERRITIN, IRONPCTSAT Lipid Panel      Component Value Date/Time   CHOL 126 04/10/2024 0744   TRIG 150.0 (H) 04/10/2024 0744   HDL 31.00 (L) 04/10/2024 0744   CHOLHDL 4 04/10/2024 0744   VLDL 30.0 04/10/2024 0744   LDLCALC 65 04/10/2024 0744   LDLDIRECT 120.0 01/12/2022 1115   Hepatic Function Panel     Component Value Date/Time   PROT 7.2 04/10/2024 0744   ALBUMIN 4.6 04/10/2024 0744   AST 24 04/10/2024 0744   ALT 32 04/10/2024 0744   ALKPHOS 48 04/10/2024 0744   BILITOT 1.1 04/10/2024 0744   BILIDIR 0.2 05/12/2017 1125      Component Value Date/Time   TSH 1.81 04/10/2024 0744   Nutritional No results found for: VD25OH   Assessment and Plan Assessment & Plan Morbid obesity due to excess calories Morbid obesity due to excess caloric intake. He is not interested in weight loss surgery. Current weight management plan includes dietary modifications. Goal weight is approximately 230-235 pounds, adjusted based on fat percentage, visceral fat rating, and muscle mass retention. Emphasis on maintaining muscle mass to support metabolism and overall health. - Initiate category three eating plan with focus on adequate nutrition and portion control - Encourage regular walking, maintaining current activity level - Avoid weighing self between visits - Order labs to assess nutritional status and other contributing factors - Follow up in two weeks to assess progress and adjust plan as needed  Obstructive sleep apnea Obstructive sleep apnea managed with CPAP. Weight loss may improve condition, but anatomical factors may limit improvement. He is compliant with CPAP use. Potential for reduced need for CPAP with weight loss, though not guaranteed. - Continue CPAP use - Monitor for potential improvement with weight loss  Atrial fibrillation Atrial fibrillation with occasional flare-ups managed with medication. No recent need for cardioversion. Weight loss and CPAP use may reduce risk of exacerbations. He has been informed  about the option of a procedure but prefers to continue with medication for now. - Continue current medication regimen - Consider weight loss as a strategy to reduce atrial fibrillation flare-ups - Monitor for symptoms and manage with medication as needed  Essential hypertension Essential hypertension. Weight management and dietary changes may contribute to blood pressure control. - Implement dietary changes as part of obesity management plan  Mixed hyperlipidemia Mixed hyperlipidemia. Dietary changes and weight management are expected to improve lipid profile. - Implement dietary changes as part of obesity management plan  Prediabetes Prediabetes. Dietary modifications and weight loss are expected to improve glycemic control. - Implement dietary changes as part of obesity management plan  Fatigue, under evaluation for non-obesity causes Fatigue may be related to obesity or other underlying conditions. Further evaluation needed to determine non-obesity causes. - Order labs to evaluate potential non-obesity related causes of fatigue - Start Cat 3 plan  Shortness of breath with exertion, under evaluation for non-obesity causes Shortness of breath with  exertion may be related to obesity or other underlying conditions. Further evaluation needed to determine non-obesity causes. - Order labs to evaluate potential non-obesity related causes of shortness of breath and monitor as he work on weight loss and slowly increasing his exercise     I personally spent a total of 44 minutes in the care of the patient today including preparing to see the patient, reviewing separately obtained history, performing a medically appropriate evaluation of current problems, placing orders in the EMR, documenting clinical information in the EMR, customized nutritional counseling for their specific health and social needs, independently interpreting results, and explaining the pathophysiology of obesity and how it is  significantly more complex than eat less and exercise more.    Ahron was informed of the importance of frequent follow up visits to maximize his success with intensive lifestyle modifications for his obesity and obesity related health conditions as recommended by USPSTF and CMS guidelines   Louann Penton, MD

## 2024-08-16 LAB — T3: T3, Total: 159 ng/dL (ref 71–180)

## 2024-08-16 LAB — CMP14+EGFR
ALT: 29 IU/L (ref 0–44)
AST: 24 IU/L (ref 0–40)
Albumin: 4.5 g/dL (ref 3.9–4.9)
Alkaline Phosphatase: 59 IU/L (ref 44–121)
BUN/Creatinine Ratio: 13 (ref 10–24)
BUN: 12 mg/dL (ref 8–27)
Bilirubin Total: 0.8 mg/dL (ref 0.0–1.2)
CO2: 21 mmol/L (ref 20–29)
Calcium: 9.8 mg/dL (ref 8.6–10.2)
Chloride: 104 mmol/L (ref 96–106)
Creatinine, Ser: 0.91 mg/dL (ref 0.76–1.27)
Globulin, Total: 2.6 g/dL (ref 1.5–4.5)
Glucose: 103 mg/dL — ABNORMAL HIGH (ref 70–99)
Potassium: 4.5 mmol/L (ref 3.5–5.2)
Sodium: 143 mmol/L (ref 134–144)
Total Protein: 7.1 g/dL (ref 6.0–8.5)
eGFR: 94 mL/min/1.73 (ref >59)

## 2024-08-16 LAB — CBC WITH DIFFERENTIAL/PLATELET
Basophils Absolute: 0.1 x10E3/uL (ref 0.0–0.2)
Basos: 1 %
EOS (ABSOLUTE): 0.1 x10E3/uL (ref 0.0–0.4)
Eos: 2 %
Hematocrit: 47.5 % (ref 37.5–51.0)
Hemoglobin: 15.7 g/dL (ref 13.0–17.7)
Immature Grans (Abs): 0 x10E3/uL (ref 0.0–0.1)
Immature Granulocytes: 0 %
Lymphocytes Absolute: 1.8 x10E3/uL (ref 0.7–3.1)
Lymphs: 27 %
MCH: 31.2 pg (ref 26.6–33.0)
MCHC: 33.1 g/dL (ref 31.5–35.7)
MCV: 94 fL (ref 79–97)
Monocytes Absolute: 0.6 x10E3/uL (ref 0.1–0.9)
Monocytes: 8 %
Neutrophils Absolute: 4 x10E3/uL (ref 1.4–7.0)
Neutrophils: 62 %
Platelets: 177 x10E3/uL (ref 150–450)
RBC: 5.03 x10E6/uL (ref 4.14–5.80)
RDW: 12.2 % (ref 11.6–15.4)
WBC: 6.6 x10E3/uL (ref 3.4–10.8)

## 2024-08-16 LAB — INSULIN, RANDOM: INSULIN: 22.2 u[IU]/mL (ref 2.6–24.9)

## 2024-08-16 LAB — HEMOGLOBIN A1C
Est. average glucose Bld gHb Est-mCnc: 117 mg/dL
Hgb A1c MFr Bld: 5.7 % — ABNORMAL HIGH (ref 4.8–5.6)

## 2024-08-16 LAB — VITAMIN D 25 HYDROXY (VIT D DEFICIENCY, FRACTURES): Vit D, 25-Hydroxy: 40 ng/mL (ref 30.0–100.0)

## 2024-08-16 LAB — VITAMIN B12: Vitamin B-12: 581 pg/mL (ref 232–1245)

## 2024-08-16 LAB — LIPID PANEL WITH LDL/HDL RATIO
Cholesterol, Total: 128 mg/dL (ref 100–199)
HDL: 32 mg/dL — ABNORMAL LOW (ref >39)
LDL Chol Calc (NIH): 73 mg/dL (ref 0–99)
LDL/HDL Ratio: 2.3 ratio (ref 0.0–3.6)
Triglycerides: 129 mg/dL (ref 0–149)
VLDL Cholesterol Cal: 23 mg/dL (ref 5–40)

## 2024-08-16 LAB — TSH: TSH: 1.44 u[IU]/mL (ref 0.450–4.500)

## 2024-08-16 LAB — FOLATE: Folate: >20 ng/mL (ref >3.0)

## 2024-08-16 LAB — T4, FREE: Free T4: 1.32 ng/dL (ref 0.82–1.77)

## 2024-08-25 ENCOUNTER — Other Ambulatory Visit: Payer: Self-pay | Admitting: Cardiology

## 2024-08-25 DIAGNOSIS — I4891 Unspecified atrial fibrillation: Secondary | ICD-10-CM

## 2024-08-26 ENCOUNTER — Other Ambulatory Visit: Payer: Self-pay | Admitting: Cardiology

## 2024-08-26 NOTE — Telephone Encounter (Signed)
 Prescription refill request for Eliquis  received. Indication: AF Last office visit: 6/25 Scr: 0.91 Age: 68 Weight: 122.9

## 2024-08-29 ENCOUNTER — Ambulatory Visit (INDEPENDENT_AMBULATORY_CARE_PROVIDER_SITE_OTHER): Admitting: Family Medicine

## 2024-08-29 ENCOUNTER — Encounter (INDEPENDENT_AMBULATORY_CARE_PROVIDER_SITE_OTHER): Payer: Self-pay | Admitting: Family Medicine

## 2024-08-29 VITALS — BP 116/68 | HR 57 | Temp 98.3°F | Ht 72.5 in | Wt 259.0 lb

## 2024-08-29 DIAGNOSIS — E782 Mixed hyperlipidemia: Secondary | ICD-10-CM | POA: Diagnosis not present

## 2024-08-29 DIAGNOSIS — Z6834 Body mass index (BMI) 34.0-34.9, adult: Secondary | ICD-10-CM

## 2024-08-29 DIAGNOSIS — R7303 Prediabetes: Secondary | ICD-10-CM | POA: Diagnosis not present

## 2024-08-29 DIAGNOSIS — I10 Essential (primary) hypertension: Secondary | ICD-10-CM | POA: Diagnosis not present

## 2024-08-29 NOTE — Progress Notes (Signed)
 Office: [phone]  /  Fax: [phone]  WEIGHT SUMMARY AND BIOMETRICS  Anthropometric Measurements Height: 6' 0.5 (1.842 m) Weight: 259 lb (117.5 kg) BMI (Calculated): 34.63 Weight at Last Visit: 271 lb Weight Lost Since Last Visit: 12 lb Weight Gained Since Last Visit: 0 Starting Weight: 27 lb Total Weight Loss (lbs): 12 lb (5.443 kg) Peak Weight: 293 lb Waist Measurement : 48 inches   Body Composition  Body Fat %: 35.6 % Fat Mass (lbs): 92.2 lbs Muscle Mass (lbs): 158.6 lbs Total Body Water (lbs): 118.6 lbs Visceral Fat Rating : 21   Other Clinical Data RMR: 2146 Fasting: no Labs: no Today's Visit #: 2 Starting Date: 08/15/24    Chief Complaint: OBESITY   History of Present Illness Douglas Chandler is a 68 year old male with obesity and hypertension who presents for follow-up on his obesity treatment plan and review of recent workup results.  He has been following the category three eating plan 97% of the time and has incorporated physical activity, averaging 8,000 to 11,000 steps four times a week. He has lost 12 pounds since his last visit two weeks ago.  He is managing hypertension with his current medications, and with dietary changes, exercise, and weight loss, his blood pressure has improved to 116/68.  He faces challenges with the eating plan, occasionally skipping meals such as breakfast or lunch on weekends. His wife supports him in adhering to the plan, including weighing his meat portions accurately. He has adjusted his meat portions to meet the post-cooking weight goal of eight ounces.  He inquires about certain foods and beverages, confirming that low-calorie drinks and reduced-sugar ketchup are acceptable within his plan. He discusses his snack choices, such as low-calorie chips and fruit, and mentions using low-sodium, low-calorie microwave meals.  His recent workup showed good kidney and liver function, normal electrolytes, and favorable  cholesterol levels, though his HDL is slightly low. His vitamin D  level is 40, and he is currently taking a multivitamin and an additional supplement, though he is unsure of the exact vitamin D  content.  His glucose level was 103, and hemoglobin A1c was 5.7, indicating prediabetes. His fasting insulin  level was elevated at 22. He discusses dietary adjustments to manage these levels, focusing on protein intake and being selective with carbohydrate sources.      PHYSICAL EXAM:  Blood pressure 116/68, pulse (!) 57, temperature 98.3 F (36.8 C), height 6' 0.5 (1.842 m), weight 259 lb (117.5 kg), SpO2 95%. Body mass index is 34.64 kg/m.  DIAGNOSTIC DATA REVIEWED:  BMET    Component Value Date/Time   NA 143 08/15/2024 0847   K 4.5 08/15/2024 0847   CL 104 08/15/2024 0847   CO2 21 08/15/2024 0847   GLUCOSE 103 (H) 08/15/2024 0847   GLUCOSE 102 (H) 04/10/2024 0744   BUN 12 08/15/2024 0847   CREATININE 0.91 08/15/2024 0847   CALCIUM  9.8 08/15/2024 0847   GFRNONAA >60 07/30/2023 1552   GFRAA 106 07/21/2020 1528   Lab Results  Component Value Date   HGBA1C 5.7 (H) 08/15/2024   HGBA1C 5.5 03/02/2014   Lab Results  Component Value Date   INSULIN  22.2 08/15/2024   Lab Results  Component Value Date   TSH 1.440 08/15/2024   CBC    Component Value Date/Time   WBC 6.6 08/15/2024 0847   WBC 7.1 04/10/2024 0744   RBC 5.03 08/15/2024 0847   RBC 4.98 04/10/2024 0744   HGB 15.7 08/15/2024 0847  HCT 47.5 08/15/2024 0847   PLT 177 08/15/2024 0847   MCV 94 08/15/2024 0847   MCH 31.2 08/15/2024 0847   MCH 31.5 07/30/2023 2121   MCHC 33.1 08/15/2024 0847   MCHC 34.6 04/10/2024 0744   RDW 12.2 08/15/2024 0847   Iron Studies No results found for: IRON, TIBC, FERRITIN, IRONPCTSAT Lipid Panel     Component Value Date/Time   CHOL 128 08/15/2024 0847   TRIG 129 08/15/2024 0847   HDL 32 (L) 08/15/2024 0847   CHOLHDL 4 04/10/2024 0744   VLDL 30.0 04/10/2024 0744   LDLCALC  73 08/15/2024 0847   LDLDIRECT 120.0 01/12/2022 1115   Hepatic Function Panel     Component Value Date/Time   PROT 7.1 08/15/2024 0847   ALBUMIN 4.5 08/15/2024 0847   AST 24 08/15/2024 0847   ALT 29 08/15/2024 0847   ALKPHOS 59 08/15/2024 0847   BILITOT 0.8 08/15/2024 0847   BILIDIR 0.2 05/12/2017 1125      Component Value Date/Time   TSH 1.440 08/15/2024 0847   Nutritional Lab Results  Component Value Date   VD25OH 40.0 08/15/2024     Assessment and Plan Assessment & Plan Morbid obesity due to excess calories Morbid obesity with significant weight loss of 12 pounds in the last two weeks. Adherence to category three eating plan at 97% and increased physical activity with 8,000 to 11,000 steps four times a week. - Continue category three eating plan with increased snack calories to 400. - Encourage adherence to protein-rich diet and low simple carbohydrate intake. - Adjust meat portions to achieve 8-10 ounces post-cooking weight. - Continue physical activity regimen.  Essential hypertension Hypertension well-controlled with current medication regimen and lifestyle changes. Blood pressure improved to 116/68 with dietary changes and weight loss. Sodium intake remains low despite consumption of microwave meals. - Continue current antihypertensive medication regimen. - Maintain low sodium diet. - Continue diet, exercise and weight loss as discussed today as an important part of the treatment plan   Prediabetes Prediabetes with hemoglobin A1c at 5.7, indicating a need for lifestyle modifications to prevent progression to diabetes. Elevated fasting insulin  level at 22, indicating insulin  resistance. - Continue current dietary plan focusing on protein and complex carbohydrates. - Monitor blood sugar levels and insulin  response.  Mixed hyperlipidemia Mixed hyperlipidemia with good LDL and triglyceride levels, but low HDL. Emphasis on increasing physical activity to improve HDL  levels. Current dietary plan supports lipid management. - Continue current dietary and exercise regimen to improve HDL levels. - Reassess lipid profile in 3 months  Follow-Up Follow-up appointments scheduled to monitor progress and adjust treatment as necessary. Follow up in 2 weeks      I personally spent a total of 40 minutes in the care of the patient today including preparing to see the patient, reviewing separately obtained history, performing a medically appropriate evaluation of current problems, placing orders in the EMR, documenting clinical information in the EMR, customized nutritional counseling for their specific health and social needs, independently interpreting results, discussing results with the patient and educating them on how these results can affect their health and weight, and explaining the pathophysiology of obesity and how it is significantly more complex than eat less and exercise more.    Sheridan was informed of the importance of frequent follow up visits to maximize his success with intensive lifestyle modifications for his obesity and obesity related health conditions as recommended by USPSTF and CMS guidelines   Louann Penton, MD

## 2024-09-09 NOTE — Progress Notes (Unsigned)
 SUBJECTIVE: Discussed the use of AI scribe software for clinical note transcription with the patient, who gave verbal consent to proceed.  Chief Complaint: Obesity  Interim History: He is down 7 lbs since his last visit.  Down 19 lbs overall TBW Loss of 7.0%  Douglas Chandler is here to discuss his progress with his obesity treatment plan. He is on the Category 3 Plan and states he is following his eating plan approximately 100 % of the time. He states he is exercising walking 60 minutes 3-4 times per week.  Douglas Chandler is a 68 year old male who presents for follow-up of his obesity treatment plan.  He has successfully lost a total of nineteen pounds since starting the obesity treatment program, with a significant reduction in adipose mass. He adheres to a category three plan, consuming more whole foods, meeting protein and water intake recommendations, and not skipping meals. He sleeps seven to nine hours nightly and engages in physical activity by walking sixty minutes three to four times per week. No excessive hunger, and he feels satisfied with his current regimen.  He has a history of prediabetes, with an A1c that has remained around 5.7 since 2018. He is interested in seeing if his weight loss will improve his A1c levels.  His past medical history includes mixed hyperlipidemia, primary hypertension, obstructive sleep apnea managed with CPAP, persistent atrial fibrillation, and chronic systolic heart failure. He is taking medication for his atrial fibrillation and reports eating leaner meats like [place] and chicken over beef.  Socially, he works in [employer] and enjoys his job, which is not stressful. He is married, and his wife supports his dietary changes. He has three children, with the youngest daughter in college. He does not consume alcohol and has quit drinking soft drinks five years ago. He drinks iced tea with Sweet and Low and occasionally enjoys [place] ice cream bars as a treat.  He  reports feeling more energetic and active, walking between eight to eleven thousand steps a day. OBJECTIVE: Visit Diagnoses: Problem List Items Addressed This Visit     HTN (hypertension)   Persistent atrial fibrillation (HCC)   Chronic systolic heart failure (HCC)   Prediabetes   Mixed hyperlipidemia - Primary   Other Visit Diagnoses       OSA on CPAP         Obesity starting BMI 36.25         BMI 33.0-33.9,adult Current BMI 33.8         Obesity  Significant weight loss of 19 pounds since starting the healthy weight and wellness program. Adipose mass has decreased significantly, while muscle mass has slightly decreased. Following a category three plan with 100% adherence. Eating more whole foods, getting recommended protein and water intake, not skipping meals, and sleeping 7-9 hours nightly. Exercising by walking 60 minutes 3-4 times per week. - Continue current dietary and exercise regimen - Incorporate ground mal[place] and beans for added or alternatives for protein needs - Use zucchini noodles as a pasta alternative - Consider adding a weighted vest for walking to maintain muscle mass  Prediabetes A1c is in the prediabetic range but has improved since 2018. Weight loss and dietary changes are expected to further improve A1c levels. Insulin  levels also significantly elevated.  Lab Results  Component Value Date   HGBA1C 5.7 (H) 08/15/2024   HGBA1C 5.7 04/10/2024   HGBA1C 5.7 01/12/2022   Lab Results  Component Value Date   LDLCALC 73 08/15/2024  CREATININE 0.91 08/15/2024   INSULIN   Date Value Ref Range Status  08/15/2024 22.2 2.6 - 24.9 uIU/mL Final  ]Continue working on nutrition plan to decrease simple carbohydrates, increase lean proteins and exercise to promote weight loss, improve glycemic control and prevent progression to Type 2 diabetes.   - Recheck A1c/insulin  levels in a 2-3 months  Mixed hyperlipidemia Weight loss and dietary changes are expected to improve  cholesterol levels. On lipitor 10 mg daily. No SE.   Last lipids Lab Results  Component Value Date   CHOL 128 08/15/2024   HDL 32 (L) 08/15/2024   LDLCALC 73 08/15/2024   LDLDIRECT 120.0 01/12/2022   TRIG 129 08/15/2024   CHOLHDL 4 04/10/2024   Plan:  Continue lipitor  - Continue exercising to improve lipid profile.  Discussed healthier options and limiting red meat consumption. - Limit beef intake and opt for leaner meats like [place] Continue to work on nutrition plan -decreasing simple carbohydrates, increasing lean proteins, decreasing saturated fats and cholesterol , avoiding trans fats and exercise as able to promote weight loss, improve lipids and decrease cardiovascular risks.   Essential hypertension Dietary changes and weight loss are expected to have a positive impact on blood pressure. On Cardizem  60 mg BID Metoprolol  50 mg am and 100 mg pms daily.  BP Readings from Last 3 Encounters:  09/10/24 117/67  08/29/24 116/68  08/15/24 135/76   Plan: continue usual medications per cardiology for management of HTN and A fib.  Continue to work on nutrition plan to promote weight loss and improve BP control.    Obstructive sleep apnea Weight loss and improved sleep habits are likely beneficial for cardiac status and overall health.  Plan: Intensive lifestyle modifications are the first line treatment for this issue. We discussed several lifestyle modifications today and he will continue to work on diet, exercise and weight loss efforts. We will continue to monitor. Orders and follow up as documented in patient record.    Persistent atrial fibrillation and chronic systolic heart failure Continues to take medication for AFib. Weight loss and dietary changes are expected to improve heart health. Continue usual medications per cardiology- Tikosyn , cardizem , metoprolol , Entresto , and Eliquis  - Limit beef intake and opt for leaner meats like [place]  Vitals Temp: 98.4 F (36.9  C) BP: 117/67 Pulse Rate: 78 SpO2: 99 %   Anthropometric Measurements Height: 6' 0.5 (1.842 m) Weight: 252 lb (114.3 kg) BMI (Calculated): 33.69 Weight at Last Visit: 259 lb Weight Lost Since Last Visit: 7 lb Weight Gained Since Last Visit: 0 Starting Weight: 271 lb Total Weight Loss (lbs): 19 lb (8.618 kg) Peak Weight: 293 lb   Body Composition  Body Fat %: 34.5 % Fat Mass (lbs): 87.2 lbs Muscle Mass (lbs): 157.2 lbs Total Body Water (lbs): 116.6 lbs Visceral Fat Rating : 20   Other Clinical Data Fasting: No Labs: No Today's Visit #: 3 Starting Date: 08/15/24     ASSESSMENT AND PLAN:  Diet: Irvan is currently in the action stage of change. As such, his goal is to continue with weight loss efforts. He has agreed to Category 3 Plan.  Exercise: Joanthony has been instructed to work up to a goal of 150 minutes of combined cardio and strengthening exercise per week and discussed considering a weighted vest for weight loss and overall health benefits.   Behavior Modification:  We discussed the following Behavioral Modification Strategies today: increasing lean protein intake, decreasing simple carbohydrates, increasing vegetables, increase H2O intake, decreasing  sodium intake, increase high fiber foods, meal planning and cooking strategies, avoiding temptations, and planning for success. We discussed various medication options to help Parmvir with his weight loss efforts and we both agreed to continue current treatment plan.  Return in about 2 weeks (around 09/24/2024).SABRA He was informed of the importance of frequent follow up visits to maximize his success with intensive lifestyle modifications for his multiple health conditions.  Attestation Statements:   Reviewed by clinician on day of visit: allergies, medications, problem list, medical history, surgical history, family history, social history, and previous encounter notes.   Time spent on visit including pre-visit chart  review and post-visit care and charting was 43 minutes.    Eluterio Seymour, PA-C

## 2024-09-10 ENCOUNTER — Encounter (INDEPENDENT_AMBULATORY_CARE_PROVIDER_SITE_OTHER): Payer: Self-pay | Admitting: Physician Assistant

## 2024-09-10 ENCOUNTER — Ambulatory Visit (INDEPENDENT_AMBULATORY_CARE_PROVIDER_SITE_OTHER): Admitting: Physician Assistant

## 2024-09-10 VITALS — BP 117/67 | HR 78 | Temp 98.4°F | Ht 72.5 in | Wt 252.0 lb

## 2024-09-10 DIAGNOSIS — E782 Mixed hyperlipidemia: Secondary | ICD-10-CM

## 2024-09-10 DIAGNOSIS — E669 Obesity, unspecified: Secondary | ICD-10-CM

## 2024-09-10 DIAGNOSIS — G4733 Obstructive sleep apnea (adult) (pediatric): Secondary | ICD-10-CM

## 2024-09-10 DIAGNOSIS — I10 Essential (primary) hypertension: Secondary | ICD-10-CM

## 2024-09-10 DIAGNOSIS — I110 Hypertensive heart disease with heart failure: Secondary | ICD-10-CM | POA: Diagnosis not present

## 2024-09-10 DIAGNOSIS — I5022 Chronic systolic (congestive) heart failure: Secondary | ICD-10-CM | POA: Diagnosis not present

## 2024-09-10 DIAGNOSIS — Z6833 Body mass index (BMI) 33.0-33.9, adult: Secondary | ICD-10-CM

## 2024-09-10 DIAGNOSIS — R7303 Prediabetes: Secondary | ICD-10-CM

## 2024-09-10 DIAGNOSIS — I4819 Other persistent atrial fibrillation: Secondary | ICD-10-CM

## 2024-09-16 ENCOUNTER — Ambulatory Visit: Admitting: Dermatology

## 2024-09-16 ENCOUNTER — Encounter: Payer: Self-pay | Admitting: Dermatology

## 2024-09-16 VITALS — BP 93/67

## 2024-09-16 DIAGNOSIS — L570 Actinic keratosis: Secondary | ICD-10-CM

## 2024-09-16 DIAGNOSIS — W908XXA Exposure to other nonionizing radiation, initial encounter: Secondary | ICD-10-CM | POA: Diagnosis not present

## 2024-09-16 DIAGNOSIS — L719 Rosacea, unspecified: Secondary | ICD-10-CM

## 2024-09-16 MED ORDER — SAFETY SEAL MISCELLANEOUS MISC
2 refills | Status: AC
Start: 2024-09-16 — End: ?

## 2024-09-16 NOTE — Patient Instructions (Addendum)
 VISIT SUMMARY:  You came in today for a dermatological evaluation of your skin lesions and to manage your rosacea. We discussed the scaly patches on your forehead and other sun-exposed areas, as well as the improvement in the redness on your nose from your current treatment regimen.  YOUR PLAN:  -ACTINIC KERATOSIS:  Actinic keratosis is a condition where scaly patches form on the skin due to sun exposure, and these patches can potentially turn into skin cancer if not treated.  We performed cryotherapy with liquid nitrogen on the affected areas on your forehead, forearm, left cheek, and right cheek.  You should apply Aquaphor daily to the treated areas to help them heal. The scabs will eventually fall off, leaving smooth skin underneath. Please pause the use of your compound cream while these areas heal.  -ROSACEA:  Rosacea is a chronic skin condition that causes redness and visible blood vessels on your face.  Your current treatment, which includes a compound cream and oral doxycycline , has shown improvement in the redness on your nose.  Continue using the compound cream once daily, with the option to increase to twice daily if possible. We have refilled your prescription for the compound cream.  Continue taking doxycycline  50 mg once daily. We will reassess your rosacea and the treated areas in six months.  INSTRUCTIONS:  Please follow up in six months to reassess your rosacea and the areas treated with cryotherapy. If you notice any new or worsening symptoms, contact our office immediately.     Important Information  Due to recent changes in healthcare laws, you may see results of your pathology and/or laboratory studies on MyChart before the doctors have had a chance to review them. We understand that in some cases there may be results that are confusing or concerning to you. Please understand that not all results are received at the same time and often the doctors may need to  interpret multiple results in order to provide you with the best plan of care or course of treatment. Therefore, we ask that you please give us  2 business days to thoroughly review all your results before contacting the office for clarification. Should we see a critical lab result, you will be contacted sooner.   If You Need Anything After Your Visit  If you have any questions or concerns for your doctor, please call our main line at [phone] If no one answers, please leave a voicemail as directed and we will return your call as soon as possible. Messages left after 4 pm will be answered the following business day.   You may also send us  a message via MyChart. We typically respond to MyChart messages within 1-2 business days.  For prescription refills, please ask your pharmacy to contact our office. Our fax number is [phone].  If you have an urgent issue when the clinic is closed that cannot wait until the next business day, you can page your doctor at the number below.    Please note that while we do our best to be available for urgent issues outside of office hours, we are not available 24/7.   If you have an urgent issue and are unable to reach us , you may choose to seek medical care at your doctor's office, retail clinic, urgent care center, or emergency room.  If you have a medical emergency, please immediately call 911 or go to the emergency department. In the event of inclement weather, please call our main line at [phone] for  an update on the status of any delays or closures.  Dermatology Medication Tips: Please keep the boxes that topical medications come in in order to help keep track of the instructions about where and how to use these. Pharmacies typically print the medication instructions only on the boxes and not directly on the medication tubes.   If your medication is too expensive, please contact our office at [phone] or send us  a message through MyChart.    We are unable to tell what your co-pay for medications will be in advance as this is different depending on your insurance coverage. However, we may be able to find a substitute medication at lower cost or fill out paperwork to get insurance to cover a needed medication.   If a prior authorization is required to get your medication covered by your insurance company, please allow us  1-2 business days to complete this process.  Drug prices often vary depending on where the prescription is filled and some pharmacies may offer cheaper prices.  The website www.goodrx.com contains coupons for medications through different pharmacies. The prices here do not account for what the cost may be with help from insurance (it may be cheaper with your insurance), but the website can give you the price if you did not use any insurance.  - You can print the associated coupon and take it with your prescription to the pharmacy.  - You may also stop by our office during regular business hours and pick up a GoodRx coupon card.  - If you need your prescription sent electronically to a different pharmacy, notify our office through [facility] or by phone at [phone]

## 2024-09-16 NOTE — Progress Notes (Signed)
   Follow-Up Visit   Subjective  Douglas Chandler is a 68 y.o. male established patient who presents for FOLLOW UP on the diagnoses listed below:  Patient was last evaluated on 03/13/24.   Rosacea: Patient stated he is following regimen as outlined in previous OV note below: - Rx Medrock - Rosacea Extra with Oxymetazoline USP 0.8% Mnetronidazole USP 1% Nicotinamide USP 3% Ivermectin USP 1% Azelaic acid EXCP 7.5% - apply thin layer amount to face twice a day. - Using LRP double repair moisturizer - Taking Doxy 50mg  daily  He stated he see improvement and would need refills of Medrock topical today.     The following portions of the chart were reviewed this encounter and updated as appropriate: medications, allergies, medical history  Review of Systems:  No other skin or systemic complaints except as noted in HPI or Assessment and Plan.  Objective  Well appearing patient in no apparent distress; mood and affect are within normal limits.   A focused examination was performed of the following areas: face   Relevant exam findings are noted in the Assessment and Plan.         L forarm, L cheek, R cheek, forehead (5) Erythematous thin papules/macules with gritty scale.   Assessment & Plan   Assessment and Plan Actinic keratosis Presence of scaly patches on the forehead, forearm, left cheek, and right cheek, identified as actinic keratoses, which are precancerous lesions due to sun exposure. These lesions require treatment to prevent progression to skin cancer. - Perform cryotherapy with liquid nitrogen on the actinic keratoses on the forehead, forearm, left cheek, and right cheek. - Instruct to apply Aquaphor daily to the treated areas to aid healing. - Advise that the scabs will fall off, leaving smooth skin underneath. - Pause the use of the compound cream while the cryotherapy-treated areas heal.  Rosacea Improvement in redness on the nose associated with rosacea. Current  treatment includes a compound cream containing oxymetazoline, metronidazole , nicotinamide, ivermectin, and azelaic acid, as well as oral doxycycline  50 mg daily. The treatment aims to prevent rhinophyma and manage symptoms.  - Continue using the MedRock Rosacea compound cream once daily, with the option to increase to twice daily if possible. - Refill prescription for the compound cream. - Continue taking doxycycline  50 mg once daily. - Reassess the rosacea and treated areas in six months.   ROSACEA   Related Medications doxycycline  (VIBRAMYCIN ) 50 MG capsule TAKE 1 CAPSULE (50 MG TOTAL) BY MOUTH DAILY. TAKE WITH FOOD. Safety Seal Miscellaneous MISC Rosacea Extra with Oxymetazoline USP 0.8% Mnetronidazole USP 1% Nicotinamide USP 3% Iermectin USP 1% Azelaic acid EXCP 7.5% - apply thin layer  to face once a day. AK (ACTINIC KERATOSIS) (5) L forarm, L cheek, R cheek, forehead (5) Destruction of lesion - L forarm, L cheek, R cheek, forehead (5) Complexity: simple   Destruction method: cryotherapy   Informed consent: discussed and consent obtained   Timeout:  patient name, date of birth, surgical site, and procedure verified Lesion destroyed using liquid nitrogen: Yes   Region frozen until ice ball extended beyond lesion: Yes   Outcome: patient tolerated procedure well with no complications   Post-procedure details: wound care instructions given     No follow-ups on file.   Documentation: I have reviewed the above documentation for accuracy and completeness, and I agree with the above.  I, Shirron Maranda, CMA, am acting as scribe for [employer], DO.   Douglas Lenis, DO

## 2024-09-24 ENCOUNTER — Encounter (INDEPENDENT_AMBULATORY_CARE_PROVIDER_SITE_OTHER): Payer: Self-pay | Admitting: Physician Assistant

## 2024-09-24 ENCOUNTER — Ambulatory Visit (INDEPENDENT_AMBULATORY_CARE_PROVIDER_SITE_OTHER): Admitting: Physician Assistant

## 2024-09-24 VITALS — BP 108/66 | HR 76 | Temp 98.2°F | Ht 72.5 in | Wt 249.0 lb

## 2024-09-24 DIAGNOSIS — E782 Mixed hyperlipidemia: Secondary | ICD-10-CM | POA: Diagnosis not present

## 2024-09-24 DIAGNOSIS — I5022 Chronic systolic (congestive) heart failure: Secondary | ICD-10-CM

## 2024-09-24 DIAGNOSIS — R7303 Prediabetes: Secondary | ICD-10-CM

## 2024-09-24 DIAGNOSIS — I110 Hypertensive heart disease with heart failure: Secondary | ICD-10-CM

## 2024-09-24 DIAGNOSIS — Z6833 Body mass index (BMI) 33.0-33.9, adult: Secondary | ICD-10-CM

## 2024-09-24 DIAGNOSIS — I10 Essential (primary) hypertension: Secondary | ICD-10-CM

## 2024-09-24 NOTE — Progress Notes (Signed)
 SUBJECTIVE: Discussed the use of AI scribe software for clinical note transcription with the patient, who gave verbal consent to proceed.  Chief Complaint: Obesity  Interim History: He is down 3 lbs since his last visit.  Down 22 lbs overall TBW loss of 8.1%  Reviewed bio impedence scale Muscle mass - 3.6 lbs Adipose mass + 0.6 lbs Total body water + 1.4 lbs  Douglas Chandler is here to discuss his progress with his obesity treatment plan. He is on the Category 3 Plan and states he is following his eating plan approximately 100 % of the time. He states he is exercising some weight lifting/walking at work 60 minutes 3 times per week.  Douglas Chandler is a 68 year old male with obesity who presents for follow-up on his weight management plan. He is here with his wife today.   He has lost a total of 22 pounds since starting the program on August 15, 2024. He adheres to tracking his calories and macros, consumes whole foods, meets the recommended protein intake, drinks adequate water, and avoids skipping meals. He experiences some hunger but feels good overall.  He is currently on Lipitor 10 mg daily for hyperlipidemia, Entresto , Tikosyn , and Eliquis  for chronic atrial fibrillation with chronic systolic heart failure, and metoprolol  50 mg in the morning and 100 mg in the evening. He also takes magnesium  oxide 400 mg twice daily for supplementation.  He engages in physical activity by lifting weights and working out for 60 minutes three times per week.  He sleeps seven to nine hours per night and works full-time. He is mindful of his nutrition, opting for healthier alternatives such as 'zoodles' instead of pasta and low-sodium chili. He avoids fried foods and uses seasonings like Mrs. Dash instead of salt.  He manages his protein intake, and we discussed aiming for 120-130 grams per day to support muscle mass as he has lost some muscle mass along with adipose mass with his weight loss.   He consumes a  variety of protein sources, including meats, yogurt, and protein bars, and is cautious about sugar intake, preferring low-sugar or sugar-free options.  OBJECTIVE: Visit Diagnoses: Problem List Items Addressed This Visit     HTN (hypertension)   Chronic systolic heart failure (HCC)   Prediabetes   Mixed hyperlipidemia - Primary   Other Visit Diagnoses       Obesity starting BMI 36.25         BMI 33.0-33.9,adult Current BMI 33.4         Obesity  Following a category three weight loss plan with a total weight loss of 22 pounds since August 15, 2024. Adhering to dietary recommendations, tracking calories and macros, and engaging in regular physical activity. Decrease in muscle mass noted, indicating need for increased strength training and continued good protein intake. Discussed increasing protein intake to 120-130 grams daily.  - Encourage continuation of current dietary plan and tracking of calories and macros. - Recommend increasing strength training exercises, including weight lifting and planking, to prevent muscle loss. - Advise on the use of ankle weights and weighted vests to enhance muscle building. - Discuss strategies for managing holiday eating, emphasizing portion control and protein intake. - He is also interested in the [facility] program to safely resume exercise/strength training and was provided information today.   Chronic systolic heart failure Managed with Entresto , Tikosyn , and Eliquis  Reports no new issues and appears well compensated.  Plan: Continue usual medications for management of HF/A  fib Continue to work on nutrition plan and exercise to promote healthy weight loss to improve cardiac function/conditioning.   Essential hypertension Blood pressure well-controlled at 108/66 mmHg. On metoprolol  50 mg qam and 100 mg q pm. Reports no SE. HR controlled in addition to BP control.  BP Readings from Last 3 Encounters:  09/24/24 108/66  09/16/24 93/67   09/10/24 117/67  Plan: Continue usual medication Continue to work on nutrition plan to promote weight loss and improve BP control.   Hyperlipidemia LDL is not at goal.HDL- not at goal. Trig- at goal.  Medication(s): Lipitor 10 mg daily. No SE with lipitor Cardiovascular risk factors: advanced age (older than 27 for men, 53 for women), dyslipidemia, hypertension, male gender, obesity (BMI >= 30 kg/m2), and smoking/ tobacco exposure  Lab Results  Component Value Date   CHOL 128 08/15/2024   HDL 32 (L) 08/15/2024   LDLCALC 73 08/15/2024   LDLDIRECT 120.0 01/12/2022   TRIG 129 08/15/2024   CHOLHDL 4 04/10/2024   CHOLHDL 4 03/24/2023   CHOLHDL 6 01/12/2022   Lab Results  Component Value Date   ALT 29 08/15/2024   AST 24 08/15/2024   ALKPHOS 59 08/15/2024   BILITOT 0.8 08/15/2024   The ASCVD Risk score (Arnett DK, et al., 2019) failed to calculate for the following reasons:   The valid total cholesterol range is 130 to 320 mg/dL  Plan: Continue Lipitor 10 mg daily.  Continue to work on nutrition plan -decreasing simple carbohydrates, increasing lean proteins, decreasing saturated fats and cholesterol , avoiding trans fats and exercise as able to promote weight loss, improve lipids and decrease cardiovascular risks. Recheck labs in 2-3 months.   Prediabetes Last A1c was 5.7- not at goal. Insulin  22.2- not at goal   Medication(s): None Polyphagia:No Lab Results  Component Value Date   HGBA1C 5.7 (H) 08/15/2024   HGBA1C 5.7 04/10/2024   HGBA1C 5.7 01/12/2022   HGBA1C 5.9 04/07/2017   HGBA1C 5.5 03/02/2014   Lab Results  Component Value Date   INSULIN  22.2 08/15/2024    Plan:  Continue working on nutrition plan to decrease simple carbohydrates, increase lean proteins and exercise to promote weight loss, improve glycemic control and prevent progression to Type 2 diabetes.  Could consider metformin if needed for prediabetes management and insulin  resistance.  He would  like to continue to work on weight loss and nutrition plan and exercise.  Recheck insulin  level and A1C in 2-3 months.   Vitals Temp: 98.2 F (36.8 C) BP: 108/66 Pulse Rate: 76 SpO2: 98 %   Anthropometric Measurements Height: 6' 0.5 (1.842 m) Weight: 249 lb (112.9 kg) BMI (Calculated): 33.29 Weight at Last Visit: 252 lb Weight Lost Since Last Visit: 3 lb Weight Gained Since Last Visit: 0 Starting Weight: 271 lb Total Weight Loss (lbs): 22 lb (9.979 kg) Peak Weight: 293 lb   Body Composition  Body Fat %: 35.2 % Fat Mass (lbs): 87.8 lbs Muscle Mass (lbs): 153.6 lbs Total Body Water (lbs): 118 lbs Visceral Fat Rating : 20   Other Clinical Data Fasting: No Labs: No Today's Visit #: 4 Starting Date: 08/15/24     ASSESSMENT AND PLAN:  Diet: Douglas Chandler is currently in the action stage of change. As such, his goal is to continue with weight loss efforts. He has agreed to Category 3 Plan.  Exercise: Douglas Chandler has been instructed to work up to a goal of 150 minutes of combined cardio and strengthening exercise per week for weight  loss and overall health benefits.   Behavior Modification:  We discussed the following Behavioral Modification Strategies today: increasing lean protein intake, decreasing simple carbohydrates, increasing vegetables, increase H2O intake, increase high fiber foods, meal planning and cooking strategies, avoiding temptations, and planning for success. We discussed various medication options to help Douglas Chandler with his weight loss efforts and we both agreed to continue current treatment plan.  Return in about 2 weeks (around 10/08/2024).Douglas Chandler He was informed of the importance of frequent follow up visits to maximize his success with intensive lifestyle modifications for his multiple health conditions.  Attestation Statements:   Reviewed by clinician on day of visit: allergies, medications, problem list, medical history, surgical history, family history, social  history, and previous encounter notes.   Time spent on visit including pre-visit chart review and post-visit care and charting was 45 minutes.    Yazmin Locher, PA-C

## 2024-10-02 NOTE — Progress Notes (Signed)
 Electrophysiology Office Note:   Date:  10/07/2024  ID:  Prentice LITTIE Sax, DOB Apr 21, 1956, MRN [id number]  Primary Cardiologist: None Primary Heart Failure: None Electrophysiologist: OLE ONEIDA HOLTS, MD      History of Present Illness:   Douglas Chandler is a 68 y.o. male with h/o AFL s/p CTI ablation 2019 seen today for routine electrophysiology followup.   Since last being seen in our clinic the patient reports doing well over all. He is working with the [facility] and has lost ~ 24 lbs with diet and exercise. He feels much better. His wife states he is not longer snoring as previously.  He accidentally missed one dose of Tikosyn  and thought he might have had a fluttering after the fact. No AF burden otherwise.    He denies chest pain, palpitations, dyspnea, PND, orthopnea, nausea, vomiting, dizziness, syncope, edema, weight gain, or early satiety.   Review of systems complete and found to be negative unless listed in HPI.   EP Information / Studies Reviewed:    EKG is ordered today. Personal review as below.  EKG Interpretation Date/Time:  Monday October 07 2024 13:39:45 EDT Ventricular Rate:  56 PR Interval:  144 QRS Duration:  98 QT Interval:  462 QTC Calculation: 445 R Axis:   23  Text Interpretation: Sinus bradycardia Confirmed by Aniceto Jarvis ([id number]) on 10/07/2024 1:45:32 PM    Arrhythmia / AAD / Pertinent EP Studies AFL > initial dx 01/2018 EPS 02/20/18 > ablation of CTI / typical atrial flutter  AF > initial dx ~01/2020  Tikosyn  05/2020 loading >   Risk Assessment/Calculations:    CHA2DS2-VASc Score = 3   This indicates a 3.2% annual risk of stroke. The patient's score is based upon: CHF History: 1 HTN History: 1 Diabetes History: 0 Stroke History: 0 Vascular Disease History: 0 Age Score: 1 Gender Score: 0             Physical Exam:   VS:  BP 107/61 (BP Location: Left Arm, Patient Position: Sitting, Cuff Size: Normal)   Pulse (!)  52   Ht 6' 2 (1.88 m)   Wt 251 lb 3.2 oz (113.9 kg)   SpO2 97%   BMI 32.25 kg/m    Wt Readings from Last 3 Encounters:  10/07/24 251 lb 3.2 oz (113.9 kg)  09/24/24 249 lb (112.9 kg)  09/10/24 252 lb (114.3 kg)     GEN: Well nourished, well developed in no acute distress NECK: No JVD; No carotid bruits CARDIAC: Regular rate and rhythm, no murmurs, rubs, gallops RESPIRATORY:  Clear to auscultation without rales, wheezing or rhonchi  ABDOMEN: Soft, non-tender, non-distended EXTREMITIES:  No edema; No deformity   ASSESSMENT AND PLAN:    Persistent Atrial Fibrillation  High Risk Medication Monitoring: Tikosyn  CHA2DS2-VASc 3 -EKG with NSR, QTc 445 ms -OAC for stroke prophylaxis  -continue Tikosyn  500 mcg BID  -update Tikosyn  labs > BMP, Mg+   -Toprol  50 mg in am 100 mg PM -PRN cardizem  60 mg BID as needed for palpitations / heart racing     Typical Atrial Flutter  S/p ablation 2019  -monitor   Secondary Hypercoagulable State  -continue Eliquis  5mg  BID, dose reviewed and appropriate by age / wt  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 Will plan to transition to Dr. Almetta.   Signed, Jarvis Aniceto, NP-C, AGACNP-BC [facility] - Electrophysiology  10/07/2024, 5:25 PM

## 2024-10-07 ENCOUNTER — Encounter: Payer: Self-pay | Admitting: Pulmonary Disease

## 2024-10-07 ENCOUNTER — Ambulatory Visit: Attending: Pulmonary Disease | Admitting: Pulmonary Disease

## 2024-10-07 ENCOUNTER — Ambulatory Visit: Admitting: Pulmonary Disease

## 2024-10-07 VITALS — BP 107/61 | HR 52 | Ht 74.0 in | Wt 251.2 lb

## 2024-10-07 DIAGNOSIS — I4819 Other persistent atrial fibrillation: Secondary | ICD-10-CM | POA: Diagnosis not present

## 2024-10-07 DIAGNOSIS — D6869 Other thrombophilia: Secondary | ICD-10-CM

## 2024-10-07 DIAGNOSIS — Z79899 Other long term (current) drug therapy: Secondary | ICD-10-CM

## 2024-10-07 DIAGNOSIS — Z5181 Encounter for therapeutic drug level monitoring: Secondary | ICD-10-CM | POA: Diagnosis not present

## 2024-10-07 NOTE — Patient Instructions (Signed)
 Medication Instructions:  Your physician recommends that you continue on your current medications as directed. Please refer to the Current Medication list given to you today.  *If you need a refill on your cardiac medications before your next appointment, please call your pharmacy*  Lab Work: BMET, MAG-TODAY If you have labs (blood work) drawn today and your tests are completely normal, you will receive your results only by: MyChart Message (if you have MyChart) OR A paper copy in the mail If you have any lab test that is abnormal or we need to change your treatment, we will call you to review the results.  Follow-Up: At [facility], you and your health needs are our priority.  As part of our continuing mission to provide you with exceptional heart care, our providers are all part of one team.  This team includes your primary Cardiologist (physician) and Advanced Practice Providers or APPs (Physician Assistants and Nurse Practitioners) who all work together to provide you with the care you need, when you need it.  Your next appointment:   4 month(s)  Provider:   Daphne Barrack, NP

## 2024-10-08 ENCOUNTER — Ambulatory Visit (INDEPENDENT_AMBULATORY_CARE_PROVIDER_SITE_OTHER): Admitting: Physician Assistant

## 2024-10-08 ENCOUNTER — Ambulatory Visit: Payer: Self-pay | Admitting: Pulmonary Disease

## 2024-10-08 ENCOUNTER — Encounter (INDEPENDENT_AMBULATORY_CARE_PROVIDER_SITE_OTHER): Payer: Self-pay | Admitting: Physician Assistant

## 2024-10-08 VITALS — BP 117/67 | HR 50 | Temp 98.2°F | Ht 72.5 in | Wt 241.0 lb

## 2024-10-08 DIAGNOSIS — E785 Hyperlipidemia, unspecified: Secondary | ICD-10-CM | POA: Diagnosis not present

## 2024-10-08 DIAGNOSIS — Z6832 Body mass index (BMI) 32.0-32.9, adult: Secondary | ICD-10-CM

## 2024-10-08 DIAGNOSIS — R7303 Prediabetes: Secondary | ICD-10-CM

## 2024-10-08 DIAGNOSIS — I10 Essential (primary) hypertension: Secondary | ICD-10-CM | POA: Diagnosis not present

## 2024-10-08 DIAGNOSIS — E669 Obesity, unspecified: Secondary | ICD-10-CM

## 2024-10-08 DIAGNOSIS — E782 Mixed hyperlipidemia: Secondary | ICD-10-CM

## 2024-10-08 LAB — BASIC METABOLIC PANEL WITH GFR
BUN/Creatinine Ratio: 17 (ref 10–24)
BUN: 17 mg/dL (ref 8–27)
CO2: 25 mmol/L (ref 20–29)
Calcium: 9.8 mg/dL (ref 8.6–10.2)
Chloride: 102 mmol/L (ref 96–106)
Creatinine, Ser: 1.02 mg/dL (ref 0.76–1.27)
Glucose: 75 mg/dL (ref 70–99)
Potassium: 4.3 mmol/L (ref 3.5–5.2)
Sodium: 140 mmol/L (ref 134–144)
eGFR: 82 mL/min/1.73 (ref >59)

## 2024-10-08 LAB — MAGNESIUM: Magnesium: 2.1 mg/dL (ref 1.6–2.3)

## 2024-10-08 NOTE — Progress Notes (Signed)
 SUBJECTIVE: Discussed the use of AI scribe software for clinical note transcription with the patient, who gave verbal consent to proceed.  Chief Complaint: Obesity  Interim History: Douglas Chandler is down 8 lbs since his last visit.  Down 30 lbs overall TBW loss of 11.1%  Douglas Chandler is here to discuss his progress with his obesity treatment plan. Douglas Chandler is on the Category 3 Plan and states Douglas Chandler is following his eating plan approximately 100 % of the time. Douglas Chandler states Douglas Chandler is exercising treadmill/weights/chair exercises 60 minutes 4 times per week.  Douglas Chandler is a 68 year old male who presents for follow-up on his weight loss treatment plan.  Douglas Chandler is adhering to a category three obesity treatment plan, resulting in a total weight loss of 30 pounds, including an 8-pound loss since his last visit. Douglas Chandler tracks his calories and macronutrients, consumes more whole foods, drinks adequate water, and does not skip meals. Douglas Chandler sleeps 7 to 9 hours nightly and exercises for 60 minutes four days per week, including treadmill, weight, and chair exercises.  Douglas Chandler occasionally eats out but chooses healthier options such as grilled chicken, turnip greens, and broccoli. His BMI has decreased to 32, and his body adipose percentage has reduced from 37.5% to 34.4%. Douglas Chandler aims to reduce his body adipose percentage further towards 25%.  Douglas Chandler has experienced some muscle mass loss but is working on maintaining it through exercise. Douglas Chandler is considering starting to work out at his [employer]. No feelings of hunger despite increased physical activity, and Douglas Chandler maintains satisfaction with his current diet. Douglas Chandler typically consumes between 275 and 375 calories per meal, writing down his intake to track it. Douglas Chandler goes to bed between 9:30 and 10:00 PM and wakes up at 5:00 AM.  His visceral adipose rating has improved from 23 to 19, with a goal of 12 or less. Douglas Chandler recalls a past episode of dizziness after significant weight loss, which was attributed to his heart  medication for atrial fibrillation. Douglas Chandler ensures adequate hydration, drinking water and tea with low-calorie sweeteners.  Douglas Chandler has eliminated alcohol from his diet, having quit several years ago, and does not find it challenging to abstain. Douglas Chandler also avoids high-sodium foods, opting for low-sodium alternatives, and does not add salt to his meals. Douglas Chandler is considering incorporating breakfast foods for dinner and is exploring high-protein pasta alternatives. Douglas Chandler is mindful of his protein intake, aiming for 100 to 110 grams per day, and uses [employer] milk to supplement his protein intake.  Personal goal 225 lbs   OBJECTIVE: Visit Diagnoses: Problem List Items Addressed This Visit     HTN (hypertension)   Prediabetes - Primary   Mixed hyperlipidemia   Other Visit Diagnoses       Obesity starting BMI 36.25         BMI 32.0-32.9,adult Current BMI 32.3         Obesity Significant weight loss of 30 pounds, with a recent loss of 8 pounds. BMI decreased to 32, and body adipose percentage reduced from 37.5% to 34.4%. Visceral adipose rating improved from 23 to 19, with a goal of 12 or less.  Douglas Chandler is following a category three diet plan, tracking calories and macros, consuming whole foods, adequate protein, and water, and exercising regularly. No meal skipping and adequate sleep reported. Douglas Chandler is satisfied with current dietary intake and exercise regimen. Cardiologist is pleased with weight loss and advised monitoring for dizziness due to potential medication adjustments. - Continue category three diet plan. -  Maintain current exercise regimen, including treadmill, weights, and chair exercises. - Consider joining a gym and participating in the Right Start program for safe exercise guidance. - Maintain hydration and adequate protein intake. - Monitor for dizziness and adjust heart medication if necessary. - Scheduled follow-up appointment with Doctor Verdon on November 11th, 2025.   Hyperlipidemia LDL is at  goal. HDL- low, not at goal. Trig at goal Medication(s): Atorvastatin  10 mg daily Cardiovascular risk factors: advanced age (older than 42 for men, 63 for women), dyslipidemia, hypertension, male gender, obesity (BMI >= 30 kg/m2), and smoking/ tobacco exposure  Lab Results  Component Value Date   CHOL 128 08/15/2024   HDL 32 (L) 08/15/2024   LDLCALC 73 08/15/2024   LDLDIRECT 120.0 01/12/2022   TRIG 129 08/15/2024   CHOLHDL 4 04/10/2024   CHOLHDL 4 03/24/2023   CHOLHDL 6 01/12/2022   Lab Results  Component Value Date   ALT 29 08/15/2024   AST 24 08/15/2024   ALKPHOS 59 08/15/2024   BILITOT 0.8 08/15/2024   The ASCVD Risk score (Arnett DK, et al., 2019) failed to calculate for the following reasons:   The valid total cholesterol range is 130 to 320 mg/dL  Plan: Continue statin. Continue to work on nutrition plan -decreasing simple carbohydrates, increasing lean proteins, decreasing saturated fats and cholesterol , avoiding trans fats and exercise as able to promote weight loss, improve lipids and decrease cardiovascular risks.     Hypertension Hypertension well controlled, asymptomatic, and no significant medication side effects noted.  Medication(s): metoprolol  50 mg am, 100 mg pm daily,    Cardizem  60 mg BID if needed   BP Readings from Last 3 Encounters:  10/08/24 117/67  10/07/24 107/61  09/24/24 108/66   Lab Results  Component Value Date   CREATININE 1.02 10/07/2024   CREATININE 0.91 08/15/2024   CREATININE 0.88 04/10/2024   Lab Results  Component Value Date   GFR 90.34 04/10/2024   GFR 81.61 03/24/2023   GFR 89.95 01/12/2022    Plan: Continue all antihypertensives at current dosages. Continue to work on nutrition plan to promote weight loss and improve BP control.    Prediabetes Last A1c was 5.7- stable and just above goal.  Insulin  22.2- not at goal Medication(s): None Polyphagia:No Lab Results  Component Value Date   HGBA1C 5.7 (H) 08/15/2024    HGBA1C 5.7 04/10/2024   HGBA1C 5.7 01/12/2022   HGBA1C 5.9 04/07/2017   HGBA1C 5.5 03/02/2014   Lab Results  Component Value Date   INSULIN  22.2 08/15/2024    Plan:  Continue working on nutrition plan to decrease simple carbohydrates, increase lean proteins and exercise to promote weight loss, improve glycemic control and prevent progression to Type 2 diabetes.  Monitor A1c and Insulin  levels periodically.   Vitals Temp: 98.2 F (36.8 C) BP: 117/67 Pulse Rate: (!) 50 SpO2: 96 %   Anthropometric Measurements Height: 6' 0.5 (1.842 m) Weight: 241 lb (109.3 kg) BMI (Calculated): 32.22 Weight at Last Visit: 249 lb Weight Lost Since Last Visit: 8 lb Weight Gained Since Last Visit: 0 Starting Weight: 271 lb Total Weight Loss (lbs): 30 lb (13.6 kg) Peak Weight: 293 lb   Body Composition  Body Fat %: 34.4 % Fat Mass (lbs): 83.2 lbs Muscle Mass (lbs): 150.8 lbs Total Body Water (lbs): 112.4 lbs Visceral Fat Rating : 19   Other Clinical Data Fasting: No Labs: No Today's Visit #: 5 Starting Date: 08/15/24     ASSESSMENT AND PLAN:  Diet: Douglas Chandler is currently in the action stage of change. As such, his goal is to continue with weight loss efforts. Douglas Chandler has agreed to Category 3 Plan and keeping a food journal and adhering to recommended goals of 1500-1600 calories and 100+ grams of protein.  Exercise: Douglas Chandler has been instructed to continue exercising as is and consider Right start program at [facility] to safely resume weight training/cardio training for weight loss and overall health benefits.   Behavior Modification:  We discussed the following Behavioral Modification Strategies today: increasing lean protein intake, decreasing simple carbohydrates, increasing vegetables, increase H2O intake, increase high fiber foods, meal planning and cooking strategies, holiday eating strategies, avoiding temptations, and planning for success. We discussed various medication options to  help Douglas Chandler with his weight loss efforts and we both agreed to continue current treatment plan.  Return in about 2 weeks (around 10/22/2024).Douglas Chandler Douglas Chandler was informed of the importance of frequent follow up visits to maximize his success with intensive lifestyle modifications for his multiple health conditions.  Attestation Statements:   Reviewed by clinician on day of visit: allergies, medications, problem list, medical history, surgical history, family history, social history, and previous encounter notes.   Time spent on visit including pre-visit chart review and post-visit care and charting was 42 minutes.    Douglas Abelson, PA-C

## 2024-10-14 ENCOUNTER — Encounter: Payer: Self-pay | Admitting: Radiology

## 2024-10-22 ENCOUNTER — Encounter (INDEPENDENT_AMBULATORY_CARE_PROVIDER_SITE_OTHER): Payer: Self-pay | Admitting: Family Medicine

## 2024-10-22 ENCOUNTER — Ambulatory Visit (INDEPENDENT_AMBULATORY_CARE_PROVIDER_SITE_OTHER): Payer: Self-pay | Admitting: Family Medicine

## 2024-10-22 VITALS — BP 122/67 | HR 57 | Temp 98.0°F | Ht 72.5 in | Wt 238.0 lb

## 2024-10-22 DIAGNOSIS — Z6831 Body mass index (BMI) 31.0-31.9, adult: Secondary | ICD-10-CM

## 2024-10-22 DIAGNOSIS — E782 Mixed hyperlipidemia: Secondary | ICD-10-CM

## 2024-10-22 DIAGNOSIS — M1990 Unspecified osteoarthritis, unspecified site: Secondary | ICD-10-CM | POA: Diagnosis not present

## 2024-10-22 DIAGNOSIS — I10 Essential (primary) hypertension: Secondary | ICD-10-CM

## 2024-10-22 DIAGNOSIS — R7303 Prediabetes: Secondary | ICD-10-CM | POA: Diagnosis not present

## 2024-10-22 DIAGNOSIS — E669 Obesity, unspecified: Secondary | ICD-10-CM

## 2024-10-22 DIAGNOSIS — E785 Hyperlipidemia, unspecified: Secondary | ICD-10-CM

## 2024-10-22 NOTE — Progress Notes (Signed)
 Office: [phone]  /  Fax: [phone]  WEIGHT SUMMARY AND BIOMETRICS  Anthropometric Measurements Height: 6' 0.5 (1.842 m) Weight: 238 lb (108 kg) BMI (Calculated): 31.82 Weight at Last Visit: 241 lb Weight Lost Since Last Visit: 3 lb Weight Gained Since Last Visit: 0 Starting Weight: 271 lb Total Weight Loss (lbs): 33 lb (15 kg) Peak Weight: 293 lb   Body Composition  Body Fat %: 33.7 % Fat Mass (lbs): 80.4 lbs Muscle Mass (lbs): 150.2 lbs Total Body Water (lbs): 114 lbs Visceral Fat Rating : 19   Other Clinical Data Fasting: no Labs: no Today's Visit #: 6 Starting Date: 08/15/24    Chief Complaint: OBESITY    History of Present Illness Douglas Chandler is a 68 year old male with obesity, prediabetes, hyperlipidemia, and hypertension who presents for obesity treatment and progress assessment.  He is adhering to a category three eating plan and exercises five days a week for 60 minutes, combining cardio and strengthening activities. He has lost three pounds over the last two weeks.  In addition to obesity, he manages prediabetes, hyperlipidemia, and hypertension with dietary and exercise modifications, alongside medications. His blood pressure was 122/67 mmHg at the time of the visit. Recent lab results show an LDL of 73 mg/dL, triglycerides at 870 mg/dL, and HDL of 32 mg/dL.  He experiences improved sleep quality, getting between 7 hours and 15 minutes to 7 hours and 30 minutes per night. He also notes increased energy levels and reduced joint pain, particularly in his shoulder and ankle, which he attributes to either bursitis or arthritis.  He and his wife have been using low sodium recipes and avoiding fried foods and high saturated fat products. He inquires about meal options for when he is pressed for time and discusses strategies for managing dietary intake during holidays.  He attends a church group called [employer] on Monday nights and another  group on Wednesday nights, which sometimes limits his time for meals. No significant hunger pains after the first week of dietary changes.      PHYSICAL EXAM:  Blood pressure 122/67, pulse (!) 57, temperature 98 F (36.7 C), height 6' 0.5 (1.842 m), weight 238 lb (108 kg), SpO2 96%. Body mass index is 31.83 kg/m.  DIAGNOSTIC DATA REVIEWED:  BMET    Component Value Date/Time   NA 140 10/07/2024 1428   K 4.3 10/07/2024 1428   CL 102 10/07/2024 1428   CO2 25 10/07/2024 1428   GLUCOSE 75 10/07/2024 1428   GLUCOSE 102 (H) 04/10/2024 0744   BUN 17 10/07/2024 1428   CREATININE 1.02 10/07/2024 1428   CALCIUM  9.8 10/07/2024 1428   GFRNONAA >60 07/30/2023 1552   GFRAA 106 07/21/2020 1528   Lab Results  Component Value Date   HGBA1C 5.7 (H) 08/15/2024   HGBA1C 5.5 03/02/2014   Lab Results  Component Value Date   INSULIN  22.2 08/15/2024   Lab Results  Component Value Date   TSH 1.440 08/15/2024   CBC    Component Value Date/Time   WBC 6.6 08/15/2024 0847   WBC 7.1 04/10/2024 0744   RBC 5.03 08/15/2024 0847   RBC 4.98 04/10/2024 0744   HGB 15.7 08/15/2024 0847   HCT 47.5 08/15/2024 0847   PLT 177 08/15/2024 0847   MCV 94 08/15/2024 0847   MCH 31.2 08/15/2024 0847   MCH 31.5 07/30/2023 2121   MCHC 33.1 08/15/2024 0847   MCHC 34.6 04/10/2024 0744   RDW 12.2 08/15/2024  9152   Iron Studies No results found for: IRON, TIBC, FERRITIN, IRONPCTSAT Lipid Panel     Component Value Date/Time   CHOL 128 08/15/2024 0847   TRIG 129 08/15/2024 0847   HDL 32 (L) 08/15/2024 0847   CHOLHDL 4 04/10/2024 0744   VLDL 30.0 04/10/2024 0744   LDLCALC 73 08/15/2024 0847   LDLDIRECT 120.0 01/12/2022 1115   Hepatic Function Panel     Component Value Date/Time   PROT 7.1 08/15/2024 0847   ALBUMIN 4.5 08/15/2024 0847   AST 24 08/15/2024 0847   ALT 29 08/15/2024 0847   ALKPHOS 59 08/15/2024 0847   BILITOT 0.8 08/15/2024 0847   BILIDIR 0.2 05/12/2017 1125       Component Value Date/Time   TSH 1.440 08/15/2024 0847   Nutritional Lab Results  Component Value Date   VD25OH 40.0 08/15/2024     Assessment and Plan Assessment & Plan Obesity Management is ongoing with adherence to the category three eating plan and regular exercise. He has lost three pounds over the last two weeks. Reports improved energy levels and better sleep. No significant challenges with hunger pains after the initial week. Meal planning and preparation are being managed with his wife's assistance. Discussed dietary modifications, including reducing saturated fat intake to less than five grams per day and maintaining a balanced diet with adequate protein intake. - Continue category three eating plan. - Continue exercise regimen of five days a week, 60 minutes per session. - Maintain saturated fat intake to less than five grams per day. - Provided additional lunch options that meet dietary criteria. - Use dining out guide for meal planning when eating outside the home.  Prediabetes Managed with lifestyle modifications, including diet and exercise.  - Continue diet, exercise and weight loss as discussed today as an important part of the treatment plan -recheck labs first of the year  Hyperlipidemia Managed with lifestyle modifications. Recent labs show LDL at 73, triglycerides at 129, and HDL at 32. Continued focus on dietary modifications to improve lipid profile. - Continue lifestyle modifications to improve lipid profile. - Recheck labs first of the year  Hypertension Well-controlled with current management. Blood pressure is stable at 122/67.  - Continue diet, exercise and weight loss as discussed today as an important part of the treatment plan  Osteoarthritis, Multiple sites Osteoarthritis symptoms have improved with weight loss and lifestyle changes. Reports decreased joint pain, particularly in the shoulder and ankle. - Continue current management and lifestyle  modifications.     I personally spent a total of 30 minutes in the care of the patient today including preparing to see the patient, performing a medically appropriate evaluation of current problems, documenting clinical information in the EMR, and customized nutritional counseling for their specific health and social needs.  Douglas Chandler was counseled on the importance of maintaining healthy lifestyle habits, including balanced nutrition, regular physical activity, and behavioral modifications, while taking antiobesity medication.  Patient verbalized understanding that medication is an adjunct to, not a replacement for, lifestyle changes and that the long-term success and weight maintenance depend on continued adherence to these strategies.   Douglas Chandler was informed of the importance of frequent follow up visits to maximize his success with intensive lifestyle modifications for his obesity and obesity related health conditions as recommended by USPSTF and CMS guidelines   Louann Penton, MD

## 2024-11-01 ENCOUNTER — Other Ambulatory Visit: Payer: Self-pay | Admitting: Adult Health

## 2024-11-12 NOTE — Progress Notes (Unsigned)
 SUBJECTIVE: Discussed the use of AI scribe software for clinical note transcription with the patient, who gave verbal consent to proceed.  Chief Complaint: Obesity  Interim History: He is down 4 lbs since his last visit.  Down 37 lbs overall TBW loss of 13.7%  Jt is here to discuss his progress with his obesity treatment plan. He is on the Category 3 Plan and states he is following his eating plan approximately 100 % of the time. He states he is exercising walking/push ups/sit ups 60 minutes 3-4 times per week.  Douglas Chandler is a 68 year old male who presents for follow-up of his obesity treatment plan.  He has successfully lost 37 pounds since starting the program on September 4th, 2025, which is a 13.7% reduction in total body weight. He uses strategies such as portion control and choosing healthier options during meals, particularly during the Thanksgiving holiday. He did not feel deprived and managed to avoid desserts. He aims to reach a weight of 225-230 pounds.  He reports increased energy levels and improved mobility, allowing him to engage in more physical activities such as exercising, house chores, and walking. He has been incorporating exercises like 'dead bugs' and is considering using a weighted vest and ankle weights to enhance his strength training.  His current medications include Eliquis  5 mg twice daily for anticoagulation, diltiazem  60 mg twice daily as needed for heart rate control, Tikosyn  500 mcg twice daily for AFib, metoprolol  50 mg in the morning and 100 mg at bedtime, and Entresto  24/26 mg twice daily for chronic systolic heart failure. He also takes a multivitamin daily.  He has a history of primary hypertension, hyperlipidemia, prediabetes, persistent AFib, obstructive sleep apnea managed with CPAP, and chronic systolic heart failure.  OBJECTIVE: Visit Diagnoses: Problem List Items Addressed This Visit     HTN (hypertension) - Primary   Persistent atrial  fibrillation (HCC)   Chronic systolic heart failure (HCC)   Prediabetes   Mixed hyperlipidemia   BMI 31.0-31.9,adult   Obesity starting BMI 36.25   Obesity Significant weight loss of 37 pounds (13.7% total body weight) since starting the program on September 4th. Current body adipose percentage is 33.2%, down from 37.5%. Visceral fat reduced from 23 to 19, with a goal of <12. Muscle mass slightly decreased, but overall weight loss is progressing well.  He reports improved energy levels and mobility. - Continue current weight loss strategies. - Incorporate strength training exercises such as dead bugs and weighted vest walking to maintain or build muscle mass . - Consider use ankle weights during work hours  - Maintain current dietary habits and portion control. - Scheduled follow-up appointment with Doctor Verdon on January 13th.  Primary hypertension Blood pressure is well-controlled with current medication regimen. Reports no SE.  metoprolol  50 mg in the morning and 100 mg at bedtime, (and Entresto  24/26 mg twice daily for chronic systolic heart failure). BP Readings from Last 3 Encounters:  11/13/24 124/73  10/22/24 122/67  10/08/24 117/67   Lab Results  Component Value Date   NA 140 10/07/2024   CL 102 10/07/2024   K 4.3 10/07/2024   CO2 25 10/07/2024   BUN 17 10/07/2024   CREATININE 1.02 10/07/2024   EGFR 82 10/07/2024   CALCIUM  9.8 10/07/2024   ALBUMIN 4.5 08/15/2024   GLUCOSE 75 10/07/2024  Plan: Continue to work on nutrition plan to promote weight loss and improve BP control.  Continue medications- metoprolol  .   Hyperlipidemia LDL  is at goal. HDL is low/not at goal. Trig- at goal Medication(s): lipitor 10 mg daily Cardiovascular risk factors: advanced age (older than 72 for men, 29 for women), dyslipidemia, hypertension, male gender, and obesity (BMI >= 30 kg/m2)  Lab Results  Component Value Date   CHOL 128 08/15/2024   HDL 32 (L) 08/15/2024   LDLCALC 73  08/15/2024   LDLDIRECT 120.0 01/12/2022   TRIG 129 08/15/2024   CHOLHDL 4 04/10/2024   CHOLHDL 4 03/24/2023   CHOLHDL 6 01/12/2022   Lab Results  Component Value Date   ALT 29 08/15/2024   AST 24 08/15/2024   ALKPHOS 59 08/15/2024   BILITOT 0.8 08/15/2024   The ASCVD Risk score (Arnett DK, et al., 2019) failed to calculate for the following reasons:   The valid total cholesterol range is 130 to 320 mg/dL  Plan: Continue lipitor 10 mg daily  Continue to work on nutrition plan -decreasing simple carbohydrates, increasing lean proteins, decreasing saturated fats and cholesterol , avoiding trans fats and exercise as able to promote weight loss, improve lipids and decrease cardiovascular risks.   Prediabetes Last A1c was 5.7- not at goal  Medication(s): None Polyphagia:No Lab Results  Component Value Date   HGBA1C 5.7 (H) 08/15/2024   HGBA1C 5.7 04/10/2024   HGBA1C 5.7 01/12/2022   HGBA1C 5.9 04/07/2017   HGBA1C 5.5 03/02/2014   Lab Results  Component Value Date   INSULIN  22.2 08/15/2024    Plan: Doing well following nutrition plan and exercising to promote healthy weight loss.  Continue working on nutrition plan to decrease simple carbohydrates, increase lean proteins and exercise to promote weight loss, improve glycemic control and prevent progression to Type 2 diabetes.  Will plan to recheck labs over the next 1-2 visits.   Persistent atrial fibrillation Managed with Tikosyn  and metoprolol . Heart rate and rhythm are well-controlled. Reports improved activity level and tolerance.  Plan:  Continue working on nutrition plan and exercise to promote weight loss and decrease CV risks.   Chronic systolic heart failure On Entresto . Reports improved exercise tolerance and generally feeling much better.  Plan: Continue current treatment plan to promote healthy weight loss to reduce CV strain.   Vitals Temp: 97.8 F (36.6 C) BP: 124/73 Pulse Rate: (!) 52 SpO2: 96  %   Anthropometric Measurements Height: 6' 0.5 (1.842 m) Weight: 234 lb (106.1 kg) BMI (Calculated): 31.28 Weight at Last Visit: 238 lbs Weight Lost Since Last Visit: 4 Weight Gained Since Last Visit: 0 Starting Weight: 271 lbs Total Weight Loss (lbs): 37 lb (16.8 kg) Peak Weight: 293 lbs   Body Composition  Body Fat %: 33.2 % Fat Mass (lbs): 77.6 lbs Muscle Mass (lbs): 148.6 lbs Total Body Water (lbs): 114.2 lbs Visceral Fat Rating : 19   Other Clinical Data Fasting: no Today's Visit #: 7 Starting Date: 08/15/24     ASSESSMENT AND PLAN:  Diet: Prosper is currently in the action stage of change. As such, his goal is to continue with weight loss efforts. He has agreed to Category 3 Plan.  Exercise: Tyce has been instructed to work up to a goal of 150 minutes of combined cardio and strengthening exercise per week for weight loss and overall health benefits.   Behavior Modification:  We discussed the following Behavioral Modification Strategies today: increasing lean protein intake, decreasing simple carbohydrates, increasing vegetables, increase H2O intake, increase high fiber foods, meal planning and cooking strategies, holiday eating strategies, avoiding temptations, and planning for success. We  discussed various medication options to help Danton with his weight loss efforts and we both agreed to continue to work on nutritional and behavioral strategies to promote weight loss.  .  Return in about 6 weeks (around 12/25/2024).SABRA He was informed of the importance of frequent follow up visits to maximize his success with intensive lifestyle modifications for his multiple health conditions.  Attestation Statements:   Reviewed by clinician on day of visit: allergies, medications, problem list, medical history, surgical history, family history, social history, and previous encounter notes.   Time spent on visit including pre-visit chart review and post-visit care and charting  was 35 minutes.    Angelisa Winthrop, PA-C

## 2024-11-13 ENCOUNTER — Encounter (INDEPENDENT_AMBULATORY_CARE_PROVIDER_SITE_OTHER): Payer: Self-pay | Admitting: Physician Assistant

## 2024-11-13 ENCOUNTER — Ambulatory Visit (INDEPENDENT_AMBULATORY_CARE_PROVIDER_SITE_OTHER): Payer: Self-pay | Admitting: Physician Assistant

## 2024-11-13 VITALS — BP 124/73 | HR 52 | Temp 97.8°F | Ht 72.5 in | Wt 234.0 lb

## 2024-11-13 DIAGNOSIS — I4819 Other persistent atrial fibrillation: Secondary | ICD-10-CM

## 2024-11-13 DIAGNOSIS — I110 Hypertensive heart disease with heart failure: Secondary | ICD-10-CM | POA: Diagnosis not present

## 2024-11-13 DIAGNOSIS — R7303 Prediabetes: Secondary | ICD-10-CM | POA: Diagnosis not present

## 2024-11-13 DIAGNOSIS — I10 Essential (primary) hypertension: Secondary | ICD-10-CM

## 2024-11-13 DIAGNOSIS — G4733 Obstructive sleep apnea (adult) (pediatric): Secondary | ICD-10-CM

## 2024-11-13 DIAGNOSIS — I5022 Chronic systolic (congestive) heart failure: Secondary | ICD-10-CM | POA: Diagnosis not present

## 2024-11-13 DIAGNOSIS — E785 Hyperlipidemia, unspecified: Secondary | ICD-10-CM

## 2024-11-13 DIAGNOSIS — E782 Mixed hyperlipidemia: Secondary | ICD-10-CM

## 2024-11-13 DIAGNOSIS — Z6831 Body mass index (BMI) 31.0-31.9, adult: Secondary | ICD-10-CM

## 2024-11-15 ENCOUNTER — Other Ambulatory Visit (HOSPITAL_COMMUNITY)
Admission: RE | Admit: 2024-11-15 | Discharge: 2024-11-15 | Disposition: A | Payer: Self-pay | Source: Ambulatory Visit | Attending: Medical Genetics | Admitting: Medical Genetics

## 2024-11-21 ENCOUNTER — Ambulatory Visit

## 2024-11-21 ENCOUNTER — Ambulatory Visit: Admitting: Adult Health

## 2024-11-21 ENCOUNTER — Ambulatory Visit: Payer: Self-pay | Admitting: Adult Health

## 2024-11-21 ENCOUNTER — Other Ambulatory Visit

## 2024-11-21 VITALS — BP 100/60 | HR 57 | Temp 97.3°F | Ht 72.5 in | Wt 233.0 lb

## 2024-11-21 DIAGNOSIS — R1031 Right lower quadrant pain: Secondary | ICD-10-CM

## 2024-11-21 DIAGNOSIS — R39198 Other difficulties with micturition: Secondary | ICD-10-CM

## 2024-11-21 DIAGNOSIS — M545 Low back pain, unspecified: Secondary | ICD-10-CM

## 2024-11-21 LAB — URINALYSIS
Bilirubin Urine: NEGATIVE
Hgb urine dipstick: NEGATIVE
Ketones, ur: NEGATIVE
Leukocytes,Ua: NEGATIVE
Nitrite: NEGATIVE
Specific Gravity, Urine: 1.015 (ref 1.000–1.030)
Total Protein, Urine: NEGATIVE
Urine Glucose: NEGATIVE
Urobilinogen, UA: 0.2 (ref 0.0–1.0)
pH: 6 (ref 5.0–8.0)

## 2024-11-21 LAB — CBC
HCT: 45.5 % (ref 39.0–52.0)
Hemoglobin: 15.4 g/dL (ref 13.0–17.0)
MCHC: 34 g/dL (ref 30.0–36.0)
MCV: 92.2 fl (ref 78.0–100.0)
Platelets: 175 K/uL (ref 150.0–400.0)
RBC: 4.93 Mil/uL (ref 4.22–5.81)
RDW: 13.2 % (ref 11.5–15.5)
WBC: 7 K/uL (ref 4.0–10.5)

## 2024-11-21 LAB — PSA: PSA: 0.29 ng/mL (ref 0.10–4.00)
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
## 2024-11-29 LAB — GENECONNECT MOLECULAR SCREEN: Genetic Analysis Overall Interpretation: NEGATIVE

## 2024-12-24 ENCOUNTER — Ambulatory Visit: Payer: Self-pay

## 2024-12-24 ENCOUNTER — Ambulatory Visit (INDEPENDENT_AMBULATORY_CARE_PROVIDER_SITE_OTHER): Admitting: Family Medicine

## 2024-12-24 ENCOUNTER — Encounter (INDEPENDENT_AMBULATORY_CARE_PROVIDER_SITE_OTHER): Payer: Self-pay | Admitting: Family Medicine

## 2024-12-24 VITALS — BP 109/65 | HR 48 | Temp 97.8°F | Ht 72.5 in | Wt 223.0 lb

## 2024-12-24 DIAGNOSIS — E669 Obesity, unspecified: Secondary | ICD-10-CM

## 2024-12-24 DIAGNOSIS — M6284 Sarcopenia: Secondary | ICD-10-CM | POA: Diagnosis not present

## 2024-12-24 DIAGNOSIS — I10 Essential (primary) hypertension: Secondary | ICD-10-CM

## 2024-12-24 DIAGNOSIS — Z6829 Body mass index (BMI) 29.0-29.9, adult: Secondary | ICD-10-CM | POA: Diagnosis not present

## 2024-12-24 NOTE — Telephone Encounter (Signed)
 FYI Only or Action Required?: FYI scheduled for Thursday afternoon  Patient was last seen in primary care on 11/21/2024 by Merna Huxley, NP.  Called Nurse Triage reporting Groin Pain.  Symptoms began several months ago.  Interventions attempted: Nothing.  Symptoms are: unchanged.  Triage Disposition: See Physician Within 24 Hours  Patient/caregiver understands and will follow disposition?: Yes          Copied from CRM #[id number]. Topic: Clinical - Red Word Triage >> Dec 24, 2024 10:43 AM Robinson H wrote: Red Word that prompted transfer to Nurse Triage: Burning sharp pain close to groin on right side, if he laughs or move a certain way it bothers him. Seen provider right before Christmas and talked to him about it but having issues with it again. Reason for Disposition  [1] MODERATE pain (e.g., interferes with normal activities) AND [2] pain comes and goes (cramps) AND [3] present > 24 hours  (Exception: Pain with Vomiting or Diarrhea - see that Guideline.)  Answer Assessment - Initial Assessment Questions 1. LOCATION: Where does it hurt?      Right lower near groin 2. RADIATION: Does the pain shoot anywhere else? (e.g., chest, back)     no 3. ONSET: When did the pain begin? (Minutes, hours or days ago)      After thanks giving. 4. SUDDEN: Gradual or sudden onset?     gradual 5. PATTERN Does the pain come and go, or is it constant?     Comes and goes - may last a few hours. Burning sensation. 6. SEVERITY: How bad is the pain?  (e.g., Scale 1-10; mild, moderate, or severe)     5/10 - Hurts with certain positions 7. RECURRENT SYMPTOM: Have you ever had this type of stomach pain before? If Yes, ask: When was the last time? and What happened that time?      no 8. CAUSE: What do you think is causing the stomach pain? (e.g., gallstones, recent abdominal surgery)     unsure 9. RELIEVING/AGGRAVATING FACTORS: What makes it better or worse? (e.g., antacids,  bending or twisting motion, bowel movement)     Comes and goes with position -  10. OTHER SYMPTOMS: Do you have any other symptoms? (e.g., back pain, diarrhea, fever, urination pain, vomiting)       Weaker urine stream  Protocols used: Abdominal Pain - Male-A-AH

## 2024-12-24 NOTE — Progress Notes (Signed)
 "  Office: [phone]  /  Fax: [phone]  WEIGHT SUMMARY AND BIOMETRICS  Anthropometric Measurements Height: 6' 0.5 (1.842 m) Weight: 223 lb (101.2 kg) BMI (Calculated): 29.81 Weight at Last Visit: 234 lb Weight Lost Since Last Visit: 11 lb Weight Gained Since Last Visit: 0 Starting Weight: 271 lb Total Weight Loss (lbs): 48 lb (21.8 kg) Peak Weight: 293 lb   Body Composition  Body Fat %: 32.7 % Fat Mass (lbs): 73 lbs Muscle Mass (lbs): 142.8 lbs Total Body Water (lbs): 109.8 lbs Visceral Fat Rating : 18   Other Clinical Data Fasting: no Labs: no Today's Visit #: 8 Starting Date: 08/15/24    Chief Complaint: OBESITY    History of Present Illness Douglas Chandler is a 69 year old male with obesity and hypertension who presents for obesity treatment and progress assessment.  He has been adhering to a category three eating plan, emphasizing hydration, protein intake, regular meals, and increased consumption of fruits and vegetables. He exercises three to four days a week for sixty minutes, incorporating both cardio and strengthening exercises. He has lost eleven pounds in the last five weeks, including over the holiday season, and has lost a total of forty-eight pounds since starting the program.  Hypertension is managed through diet, exercise, and weight loss. His current medications include diltiazem  60 mg daily, metoprolol  succinate 50 mg in the morning and 100 mg at night, and Entresto  24/26 mg twice daily. His blood pressure is well controlled at 109/65.  He feels cold, which he attributes to his medication, Eliquis , and weight loss. His wife has noticed this change as well. He experiences cold hands and often uses a blanket for warmth, which is a significant change from his previous state where he rarely needed extra warmth.  He has a history of atrial fibrillation for which he was hospitalized and monitored when he first started on Tikosyn . He has not experienced  any AFib episodes since losing weight. His pulse is generally low, around 48.  He discusses his exercise routine, noting that he has been less active during the holidays. He has options for exercising at home and at his daughter's gym, which is equipped with treadmills and weights.      PHYSICAL EXAM:  Blood pressure 109/65, pulse (!) 48, temperature 97.8 F (36.6 C), height 6' 0.5 (1.842 m), weight 223 lb (101.2 kg), SpO2 97%. Body mass index is 29.83 kg/m.  DIAGNOSTIC DATA REVIEWED BY MYSELF TODAY:  BMET    Component Value Date/Time   NA 140 10/07/2024 1428   K 4.3 10/07/2024 1428   CL 102 10/07/2024 1428   CO2 25 10/07/2024 1428   GLUCOSE 75 10/07/2024 1428   GLUCOSE 102 (H) 04/10/2024 0744   BUN 17 10/07/2024 1428   CREATININE 1.02 10/07/2024 1428   CALCIUM  9.8 10/07/2024 1428   GFRNONAA >60 07/30/2023 1552   GFRAA 106 07/21/2020 1528   Lab Results  Component Value Date   HGBA1C 5.7 (H) 08/15/2024   HGBA1C 5.5 03/02/2014   Lab Results  Component Value Date   INSULIN  22.2 08/15/2024   Lab Results  Component Value Date   TSH 1.440 08/15/2024   CBC    Component Value Date/Time   WBC 7.0 11/21/2024 0741   RBC 4.93 11/21/2024 0741   HGB 15.4 11/21/2024 0741   HGB 15.7 08/15/2024 0847   HCT 45.5 11/21/2024 0741   HCT 47.5 08/15/2024 0847   PLT 175.0 11/21/2024 0741   PLT 177 08/15/2024  0847   MCV 92.2 11/21/2024 0741   MCV 94 08/15/2024 0847   MCH 31.2 08/15/2024 0847   MCH 31.5 07/30/2023 2121   MCHC 34.0 11/21/2024 0741   RDW 13.2 11/21/2024 0741   RDW 12.2 08/15/2024 0847   Iron Studies No results found for: IRON, TIBC, FERRITIN, IRONPCTSAT Lipid Panel     Component Value Date/Time   CHOL 128 08/15/2024 0847   TRIG 129 08/15/2024 0847   HDL 32 (L) 08/15/2024 0847   CHOLHDL 4 04/10/2024 0744   VLDL 30.0 04/10/2024 0744   LDLCALC 73 08/15/2024 0847   LDLDIRECT 120.0 01/12/2022 1115   Hepatic Function Panel     Component Value  Date/Time   PROT 7.1 08/15/2024 0847   ALBUMIN 4.5 08/15/2024 0847   AST 24 08/15/2024 0847   ALT 29 08/15/2024 0847   ALKPHOS 59 08/15/2024 0847   BILITOT 0.8 08/15/2024 0847   BILIDIR 0.2 05/12/2017 1125      Component Value Date/Time   TSH 1.440 08/15/2024 0847   Nutritional Lab Results  Component Value Date   VD25OH 40.0 08/15/2024     Assessment and Plan Assessment & Plan Obesity Significant weight loss of 48 pounds since the initial visit, with 11 pounds lost in the last five weeks. Adhering to a category three eating plan, exercising 3-4 days a week, and maintaining adequate hydration and sleep. Weight loss has contributed to improved blood pressure control and reduced risk of atrial fibrillation. - Continue category three eating plan. - Continue current exercise regimen with emphasis on strengthening exercises. - Consider using a carb counter for meal planning. - Encouraged adequate protein intake to support muscle mass. - Discussed potential referral to a personal trainer or physical therapist if needed.  Sarcopenia Mild sarcopenia with significant muscle mass loss, particularly in the core, buttocks, and thighs. Weight loss has led to decreased muscle mass, impacting overall strength and longevity. - Emphasized strengthening exercises targeting core, buttocks, and thighs. - Consider referral to a personal trainer or physical therapist for structured strengthening program. - Discussed potential benefits of using [facility] gym for access to trained professionals and emergency medical support.  Hypertension Well controlled with current medication regimen and lifestyle modifications. Blood pressure is 109/65 mmHg. Weight loss and exercise have contributed to improved blood pressure control. - Continue current antihypertensive medications: diltiazem , metoprolol  succinate, and Entresto . - Continue lifestyle modifications including diet and exercise.      Patients who  are on anti-obesity medications are counseled on the importance of maintaining healthy lifestyle habits, including balanced nutrition, regular physical activity, and behavioral modifications,  Medication is an adjunct to, not a replacement for, lifestyle changes and that the long-term success and weight maintenance depend on continued adherence to these strategies.   Marlee was informed of the importance of frequent follow up visits to maximize his success with intensive lifestyle modifications for his obesity and obesity related health conditions as recommended by USPSTF and CMS guidelines  Louann Penton, MD   "

## 2024-12-26 ENCOUNTER — Ambulatory Visit (INDEPENDENT_AMBULATORY_CARE_PROVIDER_SITE_OTHER): Admitting: Adult Health

## 2024-12-26 ENCOUNTER — Encounter: Payer: Self-pay | Admitting: Adult Health

## 2024-12-26 VITALS — BP 108/62 | HR 67 | Temp 98.0°F | Ht 72.5 in | Wt 229.0 lb

## 2024-12-26 DIAGNOSIS — R1031 Right lower quadrant pain: Secondary | ICD-10-CM | POA: Diagnosis not present
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
## 2025-01-06 ENCOUNTER — Encounter: Payer: Self-pay | Admitting: Adult Health

## 2025-01-07 ENCOUNTER — Ambulatory Visit: Admitting: Adult Health

## 2025-01-07 ENCOUNTER — Encounter: Payer: Self-pay | Admitting: Adult Health

## 2025-01-07 VITALS — BP 120/80 | HR 58 | Temp 97.7°F | Ht 72.5 in | Wt 231.0 lb

## 2025-01-07 DIAGNOSIS — R1031 Right lower quadrant pain: Secondary | ICD-10-CM | POA: Diagnosis not present

## 2025-01-07 NOTE — Progress Notes (Signed)
" ° °  Acute Office Visit  Subjective:     Patient ID: Douglas Chandler, male    DOB: 10/10/1956, 69 y.o.   MRN: [id number]  Chief Complaint  Patient presents with   Abdominal Pain    HPI Patient presents today for a recheck of persistent, intermittent right groin pain for 2-3 weeks. He describes the pain as a burning pain and does not radiate. He noted that something popped out several times since his last visit, but it is not palpable when he wakes up in the morning. He states that the knot is soft and compressible. He states that the pain is worse in the evening and with long standing, squatting, bending over, or blowing his nose. Patient denies urinary symptoms, testicular/scrotal pain or swelling, fever, chills, back pain, colicky presentation, hematuria, or difficulty with ambulation. He states that rest helps his pain.   ROS See HPI    Objective:    BP 120/80   Pulse (!) 58   Temp 97.7 F (36.5 C) (Oral)   Ht 6' 0.5 (1.842 m)   Wt 104.8 kg   SpO2 96%   BMI 30.90 kg/m   Physical Exam Vitals reviewed.  Constitutional:      General: He is not in acute distress.    Appearance: He is well-developed. He is not ill-appearing or diaphoretic.  HENT:     Head: Normocephalic and atraumatic.     Mouth/Throat:     Mouth: Mucous membranes are moist.     Pharynx: Oropharynx is clear.  Eyes:     Extraocular Movements: Extraocular movements intact.     Pupils: Pupils are equal, round, and reactive to light.  Cardiovascular:     Rate and Rhythm: Normal rate and regular rhythm.     Heart sounds: Normal heart sounds.  Pulmonary:     Effort: Pulmonary effort is normal.     Breath sounds: Normal breath sounds.  Abdominal:     General: Bowel sounds are normal. There is no distension.     Palpations: Abdomen is soft. There is no mass.     Tenderness: There is no abdominal tenderness.     Hernia: No hernia is present.      Comments: Tenderness in right lower quadrant/right femoral  area worse with palpation  Genitourinary:    Penis: Normal.   Musculoskeletal:        General: No tenderness or signs of injury. Normal range of motion.  Skin:    General: Skin is warm and dry.  Neurological:     Mental Status: He is alert and oriented to person, place, and time.  Psychiatric:        Mood and Affect: Mood normal.        Behavior: Behavior normal.       Assessment & Plan:   1. Right lower quadrant abdominal pain (Primary) - CT ABDOMEN PELVIS WO CONTRAST; Future  - Rule out inguinal/femoral hernia or other concerns   Debby CHRISTELLA Borer, RN FNP Student "

## 2025-01-21 ENCOUNTER — Ambulatory Visit (INDEPENDENT_AMBULATORY_CARE_PROVIDER_SITE_OTHER): Admitting: Family Medicine

## 2025-01-31 ENCOUNTER — Ambulatory Visit: Admitting: Pulmonary Disease

## 2025-02-18 ENCOUNTER — Ambulatory Visit (INDEPENDENT_AMBULATORY_CARE_PROVIDER_SITE_OTHER): Admitting: Family Medicine

## 2025-03-17 ENCOUNTER — Ambulatory Visit: Admitting: Dermatology

## 2025-03-27 ENCOUNTER — Encounter: Admitting: Adult Health
